# Patient Record
Sex: Male | Born: 1945 | Race: Black or African American | Hispanic: No | Smoking: Never smoker
Health system: Southern US, Community
[De-identification: ages and names within clinical notes are randomized; demographics above are authoritative.]

## PROBLEM LIST (undated history)

## (undated) DIAGNOSIS — R569 Unspecified convulsions: Secondary | ICD-10-CM

## (undated) DIAGNOSIS — I4891 Unspecified atrial fibrillation: Secondary | ICD-10-CM

## (undated) DIAGNOSIS — I428 Other cardiomyopathies: Secondary | ICD-10-CM

## (undated) HISTORY — PX: HERNIA REPAIR: SHX51

## (undated) HISTORY — DX: Other cardiomyopathies: I42.8

---

## 2005-09-28 ENCOUNTER — Emergency Department (HOSPITAL_COMMUNITY): Admission: EM | Admit: 2005-09-28 | Discharge: 2005-09-28 | Payer: Self-pay | Admitting: Emergency Medicine

## 2008-08-03 ENCOUNTER — Emergency Department (HOSPITAL_COMMUNITY): Admission: EM | Admit: 2008-08-03 | Discharge: 2008-08-03 | Payer: Self-pay | Admitting: Emergency Medicine

## 2010-02-17 ENCOUNTER — Emergency Department (HOSPITAL_COMMUNITY): Admission: EM | Admit: 2010-02-17 | Discharge: 2010-02-17 | Payer: Self-pay | Admitting: Emergency Medicine

## 2010-10-30 LAB — POCT I-STAT, CHEM 8
BUN: 16 mg/dL (ref 6–23)
Chloride: 103 mEq/L (ref 96–112)
Glucose, Bld: 108 mg/dL — ABNORMAL HIGH (ref 70–99)
Hemoglobin: 16 g/dL (ref 13.0–17.0)
Potassium: 4.2 mEq/L (ref 3.5–5.1)

## 2010-10-30 LAB — BASIC METABOLIC PANEL
Calcium: 8.6 mg/dL (ref 8.4–10.5)
Creatinine, Ser: 1.68 mg/dL — ABNORMAL HIGH (ref 0.4–1.5)
GFR calc non Af Amer: 41 mL/min — ABNORMAL LOW (ref >60)
Sodium: 135 mEq/L (ref 135–145)

## 2010-10-30 LAB — DIFFERENTIAL
Basophils Relative: 1 % (ref 0–1)
Eosinophils Relative: 0 % (ref 0–5)
Lymphocytes Relative: 26 % (ref 12–46)
Lymphs Abs: 1.3 10*3/uL (ref 0.7–4.0)
Monocytes Relative: 10 % (ref 3–12)
Neutro Abs: 3 10*3/uL (ref 1.7–7.7)

## 2010-10-30 LAB — CBC
HCT: 44.4 % (ref 39.0–52.0)
MCV: 92.9 fL (ref 78.0–100.0)
RBC: 4.78 MIL/uL (ref 4.22–5.81)
RDW: 12.5 % (ref 11.5–15.5)
WBC: 4.8 10*3/uL (ref 4.0–10.5)

## 2010-10-30 LAB — TROPONIN I: Troponin I: 0.03 ng/mL (ref 0.00–0.06)

## 2010-10-30 LAB — CK TOTAL AND CKMB (NOT AT ARMC)
CK, MB: 2.1 ng/mL (ref 0.3–4.0)
Relative Index: 1.6 (ref 0.0–2.5)

## 2010-10-30 LAB — BRAIN NATRIURETIC PEPTIDE: Pro B Natriuretic peptide (BNP): 201 pg/mL — ABNORMAL HIGH (ref 0.0–100.0)

## 2017-02-02 ENCOUNTER — Encounter (HOSPITAL_COMMUNITY): Payer: Self-pay

## 2017-02-02 ENCOUNTER — Emergency Department (HOSPITAL_COMMUNITY): Payer: Medicare Other

## 2017-02-02 ENCOUNTER — Emergency Department (HOSPITAL_COMMUNITY)
Admission: EM | Admit: 2017-02-02 | Discharge: 2017-02-02 | Disposition: A | Discharge status: Home / Self Care | Payer: Medicare Other | Attending: Emergency Medicine | Admitting: Emergency Medicine

## 2017-02-02 DIAGNOSIS — Y939 Activity, unspecified: Secondary | ICD-10-CM | POA: Diagnosis not present

## 2017-02-02 DIAGNOSIS — X58XXXA Exposure to other specified factors, initial encounter: Secondary | ICD-10-CM | POA: Diagnosis not present

## 2017-02-02 DIAGNOSIS — Y929 Unspecified place or not applicable: Secondary | ICD-10-CM | POA: Diagnosis not present

## 2017-02-02 DIAGNOSIS — S32010A Wedge compression fracture of first lumbar vertebra, initial encounter for closed fracture: Secondary | ICD-10-CM | POA: Insufficient documentation

## 2017-02-02 DIAGNOSIS — S3992XA Unspecified injury of lower back, initial encounter: Secondary | ICD-10-CM | POA: Diagnosis present

## 2017-02-02 DIAGNOSIS — Y999 Unspecified external cause status: Secondary | ICD-10-CM | POA: Insufficient documentation

## 2017-02-02 DIAGNOSIS — Z7982 Long term (current) use of aspirin: Secondary | ICD-10-CM | POA: Diagnosis not present

## 2017-02-02 NOTE — ED Notes (Signed)
[employer] notified of order  And will come to fit pt for brace

## 2017-02-02 NOTE — Discharge Instructions (Signed)
Take Tylenol, or Motrin, for pain  Follow-up with your doctor for checkup in 1 week.  Wear the brace as needed for comfort.

## 2017-02-02 NOTE — ED Provider Notes (Signed)
[facility]-EM[facility]Y DEPT Provider Note   CSN: [id number] Arrival date & time: 02/02/17  1914     History   Chief Complaint Chief Complaint  Patient presents with  . Back Pain    HPI Juan Barber is a 71 y.o. male.  The patient presents for evaluation of low back pain.  He states he injured his back 2 days ago, when he fell while having a seizure.  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  He has been using a heating pad on his back, and fell asleep with it so he thinks he burned his back.  He has not been able to get up and eat, because of the discomfort.  He is able to move his arms and legs without problems.  He lives alone.  He came here by EMS for evaluation.  He denies fever, chills, chest pain, cough, weakness or dizziness.  He is unable to walk, because of pain in his back with attempts at ambulation.  There are no other known modifying factors.  HPI  Past Medical History:  Diagnosis Date  . Stroke [facility])     There are no active problems to display for this patient.   Past Surgical History:  Procedure Laterality Date  . HERNIA REPAIR         Home Medications    Prior to Admission medications   Medication Sig Start Date End Date Taking? Authorizing Provider  aspirin EC 81 MG tablet Take 81 mg by mouth daily.   Yes [provider]    Family History No family history on file.  Social History Social History  Substance Use Topics  . Smoking status: Never Smoker  . Smokeless tobacco: Never Used  . Alcohol use Yes     Comment: occassional     Allergies   Other   Review of Systems Review of Systems  All other systems reviewed and are negative.    Physical Exam Updated Vital Signs BP (!) 114/93 (BP Location: Left Arm)   Pulse 70   Temp 98.1 F (36.7 C) (Oral)   Resp 18   Ht 5\' 11"  (1.803 m)   Wt 81.6 kg (180 lb)   SpO2 99%   BMI 25.10 kg/m   Physical Exam  Constitutional: He is  oriented to person, place, and time. He appears well-developed.  Elderly, frail  HENT:  Head: Normocephalic and atraumatic.  Right Ear: External ear normal.  Left Ear: External ear normal.  Eyes: Conjunctivae and EOM are normal. Pupils are equal, round, and reactive to light.  Neck: Normal range of motion and phonation normal. Neck supple.  Cardiovascular: Normal rate, regular rhythm and normal heart sounds.   Pulmonary/Chest: Effort normal and breath sounds normal. He exhibits no bony tenderness.  Abdominal: Soft. There is no tenderness.  Musculoskeletal:  Decreased movement lumbar region secondary to pain.  Negative straight leg raising bilaterally.  No focal tenderness of the musculature of the lower back.  Neurological: He is alert and oriented to person, place, and time. No cranial nerve deficit or sensory deficit. He exhibits normal muscle tone. Coordination normal.  Skin: Skin is warm, dry and intact.  Blisters, some denuded, lumbar region consistent with history of prolonged application of heating pad.  Psychiatric: He has a normal mood and affect. His behavior is normal. Judgment and thought content normal.  Nursing note and vitals reviewed.    ED Treatments / Results  Labs (all  labs ordered are listed, but only abnormal results are displayed) Labs Reviewed - No data to display  EKG  EKG Interpretation None       Radiology Dg Lumbar Spine Complete  Result Date: 02/02/2017 CLINICAL DATA:  Pain following fall 2 days prior EXAM: LUMBAR SPINE - COMPLETE 4+ VIEW COMPARISON:  None. FINDINGS: Frontal, lateral, spot lumbosacral lateral, and bilateral oblique views were obtained. There are 5 non-rib-bearing lumbar type vertebral bodies. There is moderate anterior wedging of the L1 vertebral body. No retropulsion of bone is evident. No other fracture is evident. No spondylolisthesis. The disc spaces appear unremarkable. No appreciable facet arthropathy. IMPRESSION: Moderate  anterior wedging of the L1 vertebral body, acute in appearance. No retropulsion of bone evident. No other fracture. No spondylolisthesis. No appreciable arthropathy. Electronically Signed   By: Bretta Bang III M.D.   On: 02/02/2017 08:57    Procedures Procedures (including critical care time)  Medications Ordered in ED Medications - No data to display   Initial Impression / Assessment and Plan / ED Course  I have reviewed the triage vital signs and the nursing notes.  Pertinent labs & imaging results that were available during my care of the patient were reviewed by me and considered in my medical decision making (see chart for details).      Patient Vitals for the past 24 hrs:  BP Temp Temp src Pulse Resp SpO2 Height Weight  02/02/17 1131 - - - 70 - - - -  02/02/17 1127 (!) 114/93 - - - - 99 % - -  02/02/17 0748 (!) 110/99 98.1 F (36.7 C) Oral 64 18 100 % - -  02/02/17 0746 - - - - - - 5\' 11"  (1.803 m) 81.6 kg (180 lb)    At discharge- reevaluation with update and discussion. After initial assessment and treatment, an updated evaluation reveals he is very comfortable in the lumbar corset, and states that he is ready to go home.  Findings discussed and questions answered. Patrice Moates L    Final Clinical Impressions(s) / ED Diagnoses   Final diagnoses:  Closed compression fracture of first lumbar vertebra, initial encounter (HCC)    Compression fracture lumbar, without evidence for lumbar myelopathy.  He is improved with bracing, and is stable for discharge.  Nursing Notes Reviewed/ Care Coordinated Applicable Imaging Reviewed Interpretation of Laboratory Data incorporated into ED treatment  The patient appears reasonably screened and/or stabilized for discharge and I doubt any other medical condition or other [facility] requiring further screening, evaluation, or treatment in the ED at this time prior to discharge.  Plan: Home Medications-OTC analgesia; Home  Treatments-rest, brace as needed; return here if the recommended treatment, does not improve the symptoms; Recommended follow up-orthopedic or PCP follow-up 1-2 weeks.   New Prescriptions Discharge Medication List as of 02/02/2017 12:13 PM       Mancel Bale, MD 02/02/17 [phone]

## 2017-02-02 NOTE — ED Notes (Signed)
Pt returned from xray

## 2017-02-02 NOTE — ED Notes (Signed)
[employer] in to fit pt with TLSO

## 2017-02-02 NOTE — ED Triage Notes (Signed)
Pt reports that he was working outside Wednesday and when he came inside he thinks he had a seizure and fell onto a moving dolly. States he has been crawling around house and not able to get up. Pt reports pain in in lower back. No incontinence of Bowel or Bladder

## 2017-02-02 NOTE — ED Notes (Signed)
Contacted [employer] will send someone out for a TLSO.

## 2017-02-02 NOTE — ED Notes (Signed)
[employer] placed TLSO on patient. EMS call for transport.

## 2017-05-09 ENCOUNTER — Emergency Department (HOSPITAL_COMMUNITY)
Admission: EM | Admit: 2017-05-09 | Discharge: 2017-05-09 | Disposition: A | Discharge status: Home / Self Care | Payer: Medicare Other | Attending: Emergency Medicine | Admitting: Emergency Medicine

## 2017-05-09 ENCOUNTER — Emergency Department (HOSPITAL_COMMUNITY): Payer: Medicare Other

## 2017-05-09 ENCOUNTER — Encounter (HOSPITAL_COMMUNITY): Payer: Self-pay | Admitting: Emergency Medicine

## 2017-05-09 DIAGNOSIS — S62102A Fracture of unspecified carpal bone, left wrist, initial encounter for closed fracture: Secondary | ICD-10-CM | POA: Insufficient documentation

## 2017-05-09 DIAGNOSIS — S32010G Wedge compression fracture of first lumbar vertebra, subsequent encounter for fracture with delayed healing: Secondary | ICD-10-CM | POA: Diagnosis not present

## 2017-05-09 DIAGNOSIS — W19XXXA Unspecified fall, initial encounter: Secondary | ICD-10-CM | POA: Insufficient documentation

## 2017-05-09 DIAGNOSIS — M545 Low back pain: Secondary | ICD-10-CM | POA: Diagnosis not present

## 2017-05-09 DIAGNOSIS — S6991XA Unspecified injury of right wrist, hand and finger(s), initial encounter: Secondary | ICD-10-CM | POA: Diagnosis present

## 2017-05-09 DIAGNOSIS — S62101A Fracture of unspecified carpal bone, right wrist, initial encounter for closed fracture: Secondary | ICD-10-CM

## 2017-05-09 DIAGNOSIS — Y929 Unspecified place or not applicable: Secondary | ICD-10-CM | POA: Diagnosis not present

## 2017-05-09 DIAGNOSIS — Y999 Unspecified external cause status: Secondary | ICD-10-CM | POA: Diagnosis not present

## 2017-05-09 DIAGNOSIS — Y939 Activity, unspecified: Secondary | ICD-10-CM | POA: Insufficient documentation

## 2017-05-09 DIAGNOSIS — Z7982 Long term (current) use of aspirin: Secondary | ICD-10-CM | POA: Insufficient documentation

## 2017-05-09 HISTORY — DX: Unspecified atrial fibrillation: I48.91

## 2017-05-09 HISTORY — DX: Unspecified convulsions: R56.9

## 2017-05-09 LAB — COMPREHENSIVE METABOLIC PANEL
ALBUMIN: 3.3 g/dL — AB (ref 3.5–5.0)
ALT: 13 U/L — ABNORMAL LOW (ref 17–63)
ANION GAP: 12 (ref 5–15)
AST: 28 U/L (ref 15–41)
Alkaline Phosphatase: 62 U/L (ref 38–126)
BUN: 13 mg/dL (ref 6–20)
CHLORIDE: 95 mmol/L — AB (ref 101–111)
CO2: 24 mmol/L (ref 22–32)
Calcium: 9.3 mg/dL (ref 8.9–10.3)
Creatinine, Ser: 1.44 mg/dL — ABNORMAL HIGH (ref 0.61–1.24)
GFR calc Af Amer: 55 mL/min — ABNORMAL LOW (ref >60)
GFR calc non Af Amer: 47 mL/min — ABNORMAL LOW (ref >60)
GLUCOSE: 130 mg/dL — AB (ref 65–99)
POTASSIUM: 4.1 mmol/L (ref 3.5–5.1)
SODIUM: 131 mmol/L — AB (ref 135–145)
TOTAL PROTEIN: 8 g/dL (ref 6.5–8.1)
Total Bilirubin: 1.5 mg/dL — ABNORMAL HIGH (ref 0.3–1.2)

## 2017-05-09 LAB — CBC WITH DIFFERENTIAL/PLATELET
BASOS ABS: 0.1 10*3/uL (ref 0.0–0.1)
Basophils Relative: 1 %
Eosinophils Absolute: 0 10*3/uL (ref 0.0–0.7)
Eosinophils Relative: 0 %
HEMATOCRIT: 38.8 % — AB (ref 39.0–52.0)
Hemoglobin: 13.6 g/dL (ref 13.0–17.0)
LYMPHS ABS: 2.5 10*3/uL (ref 0.7–4.0)
LYMPHS PCT: 38 %
MCH: 31.7 pg (ref 26.0–34.0)
MCHC: 35.1 g/dL (ref 30.0–36.0)
MCV: 90.4 fL (ref 78.0–100.0)
MONO ABS: 0.8 10*3/uL (ref 0.1–1.0)
MONOS PCT: 12 %
NEUTROS ABS: 3.1 10*3/uL (ref 1.7–7.7)
Neutrophils Relative %: 49 %
Platelets: 299 10*3/uL (ref 150–400)
RBC: 4.29 MIL/uL (ref 4.22–5.81)
RDW: 12.5 % (ref 11.5–15.5)
WBC: 6.5 10*3/uL (ref 4.0–10.5)

## 2017-05-09 NOTE — Care Management (Addendum)
CM contacted in regards to home health. Patient has compression fracture as well as bil wrist fracures. Unsure if he will want home health, wants to talk with family. Will order [facility] and have [facility] contact patient tomorrow for follow up. CM will notify Bonita Quin of [facility], who will obtain orders from chart and f/u with patient tomorrow.

## 2017-05-09 NOTE — Discharge Instructions (Signed)
Call Dr. Mort Sawyers office to arrange a follow-up appt. About your broken wrists.  As we discussed, home health will contact you in 1-2 days to set up home care.  It's important that you also arrange a follow-up appt with your primary doctor at the [facility] regarding your compression fracture.  Return to ER for any worsening symptoms.  Tylenol every 4 hrs for pain

## 2017-05-09 NOTE — ED Provider Notes (Signed)
[facility]-EM[facility]Y DEPT Provider Note   CSN: [id number] Arrival date & time: 05/09/17  0935     History   Chief Complaint Chief Complaint  Patient presents with  . Fall    HPI Juan Barber is a 71 y.o. male.  HPI   Juan Barber is a 71 y.o. male who presents to the Emergency Department complaining of low back pain and bilateral wrist pain.  He was seen here in June and diagnosed with a compression fx of his L1 vertebrae.  He was given a lumbar corset which helped the pain, but he states the brace is broken.  He also states that he suffered a mechanical fall 2 weeks ago in which he put his hands out to try to break his fall, felt a "pop" to the right wrist and feels the same popping sensation with movement of the wrist.  Milder pain with the left wrist as well.  States the pain in his back is causing him difficulty to walk and stand makes his daily activities difficult.  He denies pain radiating into his legs, numbness or weakness of the lower extremities, urine or bowel changes, fever and abdominal pain.  Has not seen a PCP or orthopedist.   Past Medical History:  Diagnosis Date  . Atrial fibrillation (HCC)   . Seizures (HCC)     There are no active problems to display for this patient.   Past Surgical History:  Procedure Laterality Date  . HERNIA REPAIR         Home Medications    Prior to Admission medications   Medication Sig Start Date End Date Taking? Authorizing Provider  aspirin EC 81 MG tablet Take 81 mg by mouth daily.    [provider]    Family History History reviewed. No pertinent family history.  Social History Social History  Substance Use Topics  . Smoking status: Never Smoker  . Smokeless tobacco: Never Used  . Alcohol use Yes     Comment: occassional     Allergies   Other   Review of Systems Review of Systems  Constitutional: Negative for fever.  Respiratory: Negative for shortness of breath.   Gastrointestinal: Negative  for abdominal pain and vomiting.  Genitourinary: Negative for decreased urine volume, difficulty urinating, dysuria, flank pain and hematuria.  Musculoskeletal: Positive for arthralgias (bilateral wrist pain) and back pain. Negative for joint swelling, neck pain and neck stiffness.  Skin: Negative for rash.  Neurological: Negative for dizziness, syncope, weakness and numbness.  All other systems reviewed and are negative.    Physical Exam Updated Vital Signs BP 137/71 (BP Location: Left Arm)   Pulse (!) 55 Comment: irregular  Temp 98 F (36.7 C) (Oral)   Resp 19   Ht 6' (1.829 m)   Wt 80.3 kg (177 lb)   SpO2 100%   BMI 24.01 kg/m   Physical Exam  Constitutional: He is oriented to person, place, and time. He appears well-developed and well-nourished. No distress.  HENT:  Head: Normocephalic and atraumatic.  Eyes: Pupils are equal, round, and reactive to light. EOM are normal.  Neck: Normal range of motion. Neck supple.  Cardiovascular: Normal rate, regular rhythm, normal heart sounds and intact distal pulses.   No murmur heard. Pulmonary/Chest: Effort normal and breath sounds normal. No respiratory distress.  Abdominal: Soft. He exhibits no distension. There is no tenderness.  Musculoskeletal: He exhibits tenderness. He exhibits no edema.       Lumbar back: He exhibits  tenderness and pain. He exhibits normal range of motion, no swelling, no deformity, no laceration and normal pulse.  Midline ttp of the upper lumbar spine   Pt has 5/5 strength against resistance of bilateral lower extremities.  Moderate edema of the right wrist.  Mild ttp of the distal left wrist.       Neurological: He is alert and oriented to person, place, and time. He has normal strength. No sensory deficit. He exhibits normal muscle tone. Coordination and gait normal. GCS eye subscore is 4. GCS verbal subscore is 5. GCS motor subscore is 6.  Reflex Scores:      Patellar reflexes are 2+ on the right side and  2+ on the left side.      Achilles reflexes are 2+ on the right side and 2+ on the left side. CN III-XII intact  Skin: Skin is warm and dry. Capillary refill takes less than 2 seconds. No rash noted.  Psychiatric: He has a normal mood and affect.  Nursing note and vitals reviewed.    ED Treatments / Results  Labs (all labs ordered are listed, but only abnormal results are displayed) Labs Reviewed  COMPREHENSIVE METABOLIC PANEL - Abnormal; Notable for the following:       Result Value   Sodium 131 (*)    Chloride 95 (*)    Glucose, Bld 130 (*)    Creatinine, Ser 1.44 (*)    Albumin 3.3 (*)    ALT 13 (*)    Total Bilirubin 1.5 (*)    GFR calc non Af Amer 47 (*)    GFR calc Af Amer 55 (*)    All other components within normal limits  CBC WITH DIFFERENTIAL/PLATELET - Abnormal; Notable for the following:    HCT 38.8 (*)    All other components within normal limits    EKG  EKG Interpretation None       Radiology Dg Lumbar Spine Complete  Result Date: 05/09/2017 CLINICAL DATA:  71 year old male with back pain for approximately 3 weeks. Status post fall 2 weeks ago. EXAM: LUMBAR SPINE - COMPLETE 4+ VIEW COMPARISON:  02/02/2017 FINDINGS: There is interval worsening of a wedge compression fracture at L1, now demonstrating greater than 50% loss of height. There is interval resorption of the anterior vertebral body. The remaining vertebral bodies and disc spaces appear unremarkable. There is mild facet hypertrophy at the lower lumbar spine. Fistula lies visceral structures grossly unremarkable.  L IMPRESSION: Interval progression of a wedge compression fracture L1 compared to study dated 02/02/2017, now demonstrating greater than 50% loss of height. There is interval reabsorption of the anterior vertebral body, and underlying pathologic lesion is difficult to exclude. Further evaluation with MRI is recommended. Electronically Signed   By: Sande Brothers M.D.   On: 05/09/2017 11:18   Dg  Wrist Complete Left  Result Date: 05/09/2017 CLINICAL DATA:  16 minutes year old male with pain status post fall 2 weeks ago. EXAM: LEFT WRIST - COMPLETE 3+ VIEW COMPARISON:  None. FINDINGS: There is oblique fracture involving the fifth metacarpal shaft. Fracture fragments do not appear to involve the articular surface. Carpal bones are intact. No significant soft tissue swelling about the wrist. IMPRESSION: Oblique fracture involving the fifth metacarpal midshaft. Dedicated views of the left hand recommended. Electronically Signed   By: Sande Brothers M.D.   On: 05/09/2017 11:08   Dg Wrist Complete Right  Result Date: 05/09/2017 CLINICAL DATA:  71 year old male status post fall two weeks ago.  EXAM: RIGHT WRIST - COMPLETE 3+ VIEW COMPARISON:  None. FINDINGS: Transverse fractures identified at the distal radial metadiaphysis. There is mild volar angulation and displacement of the distal fracture fragment. The fracture does not appear to involve the articular surface. Note is made of diffuse osteopenia. Remaining osseous structures intact. There is soft tissue swelling about the wrist. IMPRESSION: Transverse fracture involving the distal radial metadiaphysis with mild volar angulation and displacement of the distal fracture fragment. Electronically Signed   By: Sande Brothers M.D.   On: 05/09/2017 11:07   Mr Lumbar Spine Wo Contrast  Result Date: 05/09/2017 CLINICAL DATA:  Chronic back pain. Question worsening of L1 compression fracture. EXAM: MRI LUMBAR SPINE WITHOUT CONTRAST TECHNIQUE: Multiplanar, multisequence MR imaging of the lumbar spine was performed. No intravenous contrast was administered. COMPARISON:  Radiography from earlier today FINDINGS: Segmentation:  Standard Alignment:  Negative for listhesis Vertebrae: There is a L1 compression fracture that was first seen February 02, 2017. The fracture does show progressive collapse compared to that time, although posterior cortex is intact and there is no  retropulsion. Centrally, there is severe height loss and even a defect in the central body, with touching discs. Marrow edema is minimal. The L1 body is diffusely sclerotic. On sagittal T1 weighted imaging there appears to be ventral soft tissue extra osseous density, but this is not re- demonstrated on axial images. No epidural, foraminal, or pedicular signal abnormality. Elsewhere the marrow is heterogeneous without acute fracture, discitis, or aggressive bone lesion. Conus medullaris: Extends to the L1 level and appears normal. Paraspinal and other soft tissues: Thickened trabeculated bladder with a posterior diverticulum, suggesting chronic outlet obstruction. Disc levels: T12- L1: Mild far-lateral disc bulging.  No impingement L1-L2: Mild disc bulging and narrowing.  No compressive stenosis L2-L3: Mild rightward disc bulging.  No impingement L3-L4: Mild annulus bulging.  No impingement L4-L5: Degenerative disc narrowing and bulging with a central protrusion. Bilateral inferior foraminal effacement, moderate stenosis. Mild thecal sac narrowing. L5-S1:Unremarkable. These results were called by telephone at the time of interpretation on 05/09/2017 at 12:27 pm to Dr. Pauline Aus , who verbally acknowledged these results. IMPRESSION: 1. L1 compression fracture with advanced, progressive central and anterior height loss when compared to 02/02/2017 radiography. There is no significant marrow edema to suggest unhealed fracture component. Diffuse body sclerosis and demineralization is concerning for an underlying lesion, but posttraumatic re- absorption and sclerosis is also possible. If malignancy history or concerning clinical features for occult malignancy, a biopsy could be obtained. Otherwise, short interval follow-up MRI with contrast is recommended (such as 6 to 8 weeks). 2. L4-5 disc bulging and moderate bilateral foraminal narrowing. Elsewhere degenerative changes are mild. Electronically Signed   By:  Marnee Spring M.D.   On: 05/09/2017 12:35    Procedures Procedures (including critical care time)  Medications Ordered in ED Medications - No data to display   Initial Impression / Assessment and Plan / ED Course  I have reviewed the triage vital signs and the nursing notes.  Pertinent labs & imaging results that were available during my care of the patient were reviewed by me and considered in my medical decision making (see chart for details).    TLSO brace applied.  Pain improved.  Discussed findings. Offered admission given that pt lives at home alone and may have difficulty with ADL's.  Pt declined admission.   Consulted Sharley with case management.  She will contact pt at home to arrange home health f/u.  Pt  agrees with this plan.  1400 consulted Dr. Romeo Apple, recommended volar splints and will see pt in his office.    Pt is feeling better, return precautions discussed.   Final Clinical Impressions(s) / ED Diagnoses   Final diagnoses:  Closed compression fracture of L1 lumbar vertebra with delayed healing, subsequent encounter  Closed fracture of both wrists, initial encounter    New Prescriptions New Prescriptions   No medications on file     Pauline Aus, Cordelia Poche 05/11/17 2109    Samuel Jester, DO 05/12/17 2318

## 2017-05-09 NOTE — ED Triage Notes (Signed)
Pt c/o chronic back pain. States started huring while walking 3 weeks ago. Pt states fell x 2 weeks ago from seizure and c/o breaking right and left wrist. Has not been seen or treated. Right wrist swelling noted. Radial pulses wnl. rom wnl. nad

## 2017-05-11 ENCOUNTER — Ambulatory Visit (INDEPENDENT_AMBULATORY_CARE_PROVIDER_SITE_OTHER): Payer: Medicare Other | Admitting: Orthopedic Surgery

## 2017-05-11 ENCOUNTER — Ambulatory Visit (INDEPENDENT_AMBULATORY_CARE_PROVIDER_SITE_OTHER): Payer: Medicare Other

## 2017-05-11 ENCOUNTER — Encounter: Payer: Self-pay | Admitting: Orthopedic Surgery

## 2017-05-11 VITALS — BP 122/80 | HR 48 | Ht 72.0 in | Wt 177.0 lb

## 2017-05-11 DIAGNOSIS — S62327A Displaced fracture of shaft of fifth metacarpal bone, left hand, initial encounter for closed fracture: Secondary | ICD-10-CM

## 2017-05-11 DIAGNOSIS — S62101A Fracture of unspecified carpal bone, right wrist, initial encounter for closed fracture: Secondary | ICD-10-CM

## 2017-05-11 DIAGNOSIS — S6292XA Unspecified fracture of left wrist and hand, initial encounter for closed fracture: Secondary | ICD-10-CM

## 2017-05-11 DIAGNOSIS — S52541A Smith's fracture of right radius, initial encounter for closed fracture: Secondary | ICD-10-CM | POA: Diagnosis not present

## 2017-05-11 NOTE — Progress Notes (Signed)
NEW PATIENT OFFICE VISIT    Chief Complaint  Patient presents with  . New Patient (Initial Visit)    ER Follow up bilateral wrist fracture    71 year old male presents for evaluation of bilateral upper extremity fractures. The patient has had 2 falls while on June 22 where he fractured his back he sustained a compression fracture which was treated with a brace  He fell again approximately 3 weeks ago. Making date of injury September 7 injured his right wrist and left hand. 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  Pain is mild it's dull it's constant and it's in both upper extremities duration as described also complains of back pain    Review of Systems  Constitutional: Negative for fever.  Respiratory: Negative for cough.   Cardiovascular: Negative for chest pain.  Musculoskeletal: Positive for back pain.  Neurological: Negative for tingling.     Past Medical History:  Diagnosis Date  . Atrial fibrillation (HCC)   . Seizures (HCC)     Past Surgical History:  Procedure Laterality Date  . HERNIA REPAIR      History reviewed. No pertinent family history. Social History  Substance Use Topics  . Smoking status: Never Smoker  . Smokeless tobacco: Never Used  . Alcohol use Yes     Comment: occassional    BP 122/80   Pulse (!) 48   Ht 6' (1.829 m)   Wt 177 lb (80.3 kg)   BMI 24.01 kg/m   Physical Exam  Constitutional: He is oriented to person, place, and time. He appears well-developed and well-nourished.  Vital signs have been reviewed and are stable. Gen. appearance the patient is well-developed and  well-nourished with normal grooming and hygiene.   Musculoskeletal:  GAIT requires wheelchair at times but he walked in the office  Neurological: He is alert and oriented to person, place, and time.  Skin: Skin is warm and dry. No erythema.  Psychiatric: He has a normal mood and affect.  Vitals reviewed.   Ortho Exam  Left hand Dupuytren's contracture left small finger fixed contracture no motion at the fracture site mild pain over the fifth metacarpal from what we can assess the range of motion is limited by the Dupuytren's contracture there is no instability of the MCP joint there is no atrophy in the hand skin is intact pulse and perfusion are normal  The right wrist shows swelling no volar displacement deformity despite what we see on x-ray. He has pain range of motion of the wrist joint with a stable wrist joint but I think this is because of fracture started to consolidate there is no atrophy noted. The skin is dark but intact pulse and perfusion are normal  No orders of the defined types were placed in this encounter.   Encounter Diagnoses  Name Primary?  . Closed fracture of left hand, initial encounter Yes  . Right wrist fracture, closed, initial encounter    X-ray independent review Right wrist x-rays show again volar subluxation of the carpus with the volar fragment of the distal radius  Smith type II volar subluxation fracture dislocation  Left hand fifth metacarpal fracture  Films were repeated  The left fifth metacarpal is slightly translated medial to lateral proximal fragment to distal fragment. He has a right volar subluxation Katrinka Blazing type II fracture subluxation.  I placed him in a right hand cast with her metacarpal fracture follow-up in 3 weeks repeat x-ray out of plaster. We did have to bivalved cast for tightness  He will see hand specialist regarding his left wrist fracture subluxation.    PLAN:

## 2017-05-15 ENCOUNTER — Encounter (INDEPENDENT_AMBULATORY_CARE_PROVIDER_SITE_OTHER): Payer: Self-pay | Admitting: Orthopaedic Surgery

## 2017-05-15 ENCOUNTER — Ambulatory Visit (INDEPENDENT_AMBULATORY_CARE_PROVIDER_SITE_OTHER): Payer: Medicare Other | Admitting: Orthopaedic Surgery

## 2017-05-15 DIAGNOSIS — S52561A Barton's fracture of right radius, initial encounter for closed fracture: Secondary | ICD-10-CM

## 2017-05-15 DIAGNOSIS — S52541A Smith's fracture of right radius, initial encounter for closed fracture: Secondary | ICD-10-CM | POA: Insufficient documentation

## 2017-05-15 NOTE — Progress Notes (Signed)
Office Visit Note   Patient: Juan Barber           Date of Birth: [birth year]           MRN: [id number] Visit Date: 05/15/2017              Requested by: Center, [facility] [address] [place], [place] [address] PCP: Center, [facility]   Assessment & Plan: Visit Diagnoses:  1. Closed Barton's fracture of right radius, initial encounter     Plan: Patient has a subacute volar Barton distal radius fracture. I recommend surgical treatment to correct this deformity and the persistent dislocation of his wrist. He understands that he may develop posttraumatic arthritis from this injury. I will also plan on doing a carpal tunnel release to help take pressure off of the median nerve. We may need to supplement his repair with an external fixator or a bridge plate if the volar plate is not enough. The details of the surgery were discussed with the patient in agreement. We will plan on surgery this week. Total face to face encounter time was greater than 45 minutes and over half of this time was spent in counseling and/or coordination of care.  Follow-Up Instructions: Return for 2 week postop visit.   Orders:  No orders of the defined types were placed in this encounter.  No orders of the defined types were placed in this encounter.     Procedures: No procedures performed   Clinical Data: No additional findings.   Subjective: Chief Complaint  Patient presents with  . Right Wrist - Injury    Patient is a 71 year old gentleman who fell on his outstretched hand and sustained a fracture approximately 3 weeks ago. He did not seek immediate medical attention until 05/09/2017. He was diagnosed with a unstable volar Barton fracture of the right distal radius. He denies any signs or symptoms of carpal tunnel syndrome. He lives alone. He does have a history of seizures and his most recent wound was at the time of his fall. He takes aspirin for his atrial fibrillation. Denies any  numbness and tingling.    Review of Systems  Constitutional: Negative.   All other systems reviewed and are negative.    Objective: Vital Signs: There were no vitals taken for this visit.  Physical Exam  Constitutional: He is oriented to person, place, and time. He appears well-developed and well-nourished.  HENT:  Head: Normocephalic and atraumatic.  Eyes: Pupils are equal, round, and reactive to light.  Neck: Neck supple.  Pulmonary/Chest: Effort normal.  Abdominal: Soft.  Musculoskeletal: Normal range of motion.  Neurological: He is alert and oriented to person, place, and time.  Skin: Skin is warm.  Psychiatric: He has a normal mood and affect. His behavior is normal. Judgment and thought content normal.  Nursing note and vitals reviewed.   Ortho Exam Right wrist and hand exam shows no numbness and tingling of his hand or fingers. He has pain with wrist flexion and extension. His hand is volarly translated. Bounding pulses. Specialty Comments:  No specialty comments available.  Imaging: No results found.   PMFS History: Patient Active Problem List   Diagnosis Date Noted  . Closed Smith's fracture of right radius 05/15/2017   Past Medical History:  Diagnosis Date  . Atrial fibrillation (HCC)   . Seizures (HCC)     No family history on file.  Past Surgical History:  Procedure Laterality Date  . HERNIA REPAIR  Social History   Occupational History  . Not on file.   Social History Main Topics  . Smoking status: Never Smoker  . Smokeless tobacco: Never Used  . Alcohol use Yes     Comment: occassional  . Drug use: No  . Sexual activity: Not on file

## 2017-05-18 ENCOUNTER — Other Ambulatory Visit: Payer: Self-pay

## 2017-05-18 ENCOUNTER — Ambulatory Visit: Admit: 2017-05-18 | Payer: Medicare Other | Admitting: Orthopaedic Surgery

## 2017-05-18 ENCOUNTER — Encounter: Payer: Self-pay | Admitting: Family Medicine

## 2017-05-18 ENCOUNTER — Ambulatory Visit (HOSPITAL_COMMUNITY)
Admission: RE | Admit: 2017-05-18 | Discharge: 2017-05-18 | Disposition: A | Discharge status: Home / Self Care | Payer: Medicare Other | Source: Ambulatory Visit | Attending: Family Medicine | Admitting: Family Medicine

## 2017-05-18 ENCOUNTER — Ambulatory Visit (INDEPENDENT_AMBULATORY_CARE_PROVIDER_SITE_OTHER): Payer: Medicare Other | Admitting: Family Medicine

## 2017-05-18 VITALS — BP 120/70 | HR 69 | Temp 97.8°F | Resp 16 | Ht 71.0 in | Wt 157.0 lb

## 2017-05-18 DIAGNOSIS — Z01818 Encounter for other preprocedural examination: Secondary | ICD-10-CM | POA: Diagnosis present

## 2017-05-18 DIAGNOSIS — I4891 Unspecified atrial fibrillation: Secondary | ICD-10-CM | POA: Diagnosis not present

## 2017-05-18 DIAGNOSIS — R569 Unspecified convulsions: Secondary | ICD-10-CM

## 2017-05-18 DIAGNOSIS — R739 Hyperglycemia, unspecified: Secondary | ICD-10-CM | POA: Diagnosis not present

## 2017-05-18 DIAGNOSIS — I499 Cardiac arrhythmia, unspecified: Secondary | ICD-10-CM

## 2017-05-18 SURGERY — OPEN REDUCTION INTERNAL FIXATION (ORIF) DISTAL RADIUS FRACTURE
Anesthesia: General | Site: Wrist | Laterality: Right
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
## 2017-05-18 NOTE — Progress Notes (Signed)
Cardiology Office Note   Date:  05/21/2017   ID:  Juan Barber, DOB [birth year], MRN [id number]  PCP:  Aliene Beams, MD  Cardiologist:   Charlton Haws, MD   No chief complaint on file.     History of Present Illness: Juan Barber is a 71 y.o. male who presents for consultation regarding irregular heart beat. Referred by Dr Tracie Harrier  History of seizures. Two recent falls ? Related to seizures with injury to back and bilateral wrists. Also feels weak  And lightheaded when its hot out Was to have surgery with Dr Roda Shutters for fracture of distal right radius Note made of history of atrial fibrillation ECG done 05/18/17 showed ST with multiple PAC;s and PVC;s   He is right handed and has arm in splint Apparently it is not healing correctly and needs surgical correction. Left wrist is in cast and healing Well. He is a non smoker No dyspnea Some palpitations. Two episodes of "syncope" that injured back and wrists occurred during heat when He says he got too hot No chest pain or palpitations at time.   He is vague about his history of PAF but has not been on anticoagulation   This patients CHA2DS2-VASc Score and unadjusted Ischemic Stroke Rate (% per year) is equal to 0.6 % stroke rate/year from a score of 1  Above score calculated as 1 point each if present [CHF, HTN, DM, Vascular=MI/PAD/Aortic Plaque, Age if 65-74, or Male] Above score calculated as 2 points each if present [Age > 75, or Stroke/TIA/TE]    Past Medical History:  Diagnosis Date  . Atrial fibrillation (HCC)   . Seizures (HCC)     Past Surgical History:  Procedure Laterality Date  . HERNIA REPAIR       Current Outpatient Prescriptions  Medication Sig Dispense Refill  . aspirin EC 81 MG tablet Take 81 mg by mouth daily.    . metoprolol tartrate (LOPRESSOR) 50 MG tablet Take 1 tablet (50 mg total) by mouth 2 (two) times daily. 180 tablet 3   No current facility-administered medications for this visit.      Allergies:   Other and Talwin [pentazocine]    Social History:  The patient  reports that he has never smoked. He has never used smokeless tobacco. He reports that he drinks alcohol. He reports that he does not use drugs.   Family History:  The patient's family history includes Heart attack in his mother.    ROS:  Please see the history of present illness.   Otherwise, review of systems are positive for none.   All other systems are reviewed and negative.    PHYSICAL EXAM: VS:  BP 120/66 (BP Location: Left Arm)   Pulse 67   Ht 5\' 10"  (1.778 m)   Wt 156 lb (70.8 kg)   SpO2 96%   BMI 22.38 kg/m  , BMI Body mass index is 22.38 kg/m. Affect appropriate Elderly black male  HEENT: normal Neck supple with no adenopathy JVP normal no bruits no thyromegaly Lungs clear with no wheezing and good diaphragmatic motion Heart:  S1/S2 SEM  murmur, no rub, gallop or click PMI normal Abdomen: benighn, BS positve, no tenderness, no AAA no bruit.  No HSM or HJR Distal pulses intact with no bruits No edema Neuro non-focal Skin warm and dry Right hand in splint and left in cast     EKG:  ST multiple PACls and PVC;s    Recent Labs: 05/09/2017: ALT  13; Hemoglobin 13.6; Platelets 299 05/18/2017: BUN 12; Creat 1.36; Potassium 4.0; Sodium 132; TSH 1.87    Lipid Panel No results found for: CHOL, TRIG, HDL, CHOLHDL, VLDL, LDLCALC, LDLDIRECT    Wt Readings from Last 3 Encounters:  05/21/17 156 lb (70.8 kg)  05/18/17 157 lb (71.2 kg)  05/11/17 177 lb (80.3 kg)      Other studies Reviewed: Additional studies/ records that were reviewed today include: Notes ER labs ECG and xrays .    ASSESSMENT AND PLAN:  1.  Pre-op need to establish baseline rhythm with monitor and r/o structural disease with echo.No need for stress testing at this time 2. Tachycardia appears to have both PAF and ST with frequent PAC;s/ PVCls will start lopressor 50 bid and do 7 day monitor If  Mostly afib will  rate control and if echo ok clear for surgery . CHA2VASC is only one but would start anticoagulation if he needs [facility] Post op  3. Seizures needs f/u with neuro EEG and possible CT head don't think this is related to his cardiac rhythm    Current medicines are reviewed at length with the patient today.  The patient does not have concerns regarding medicines.  The following changes have been made:  Lopresser 50 bid   Labs/ tests ordered today include: event monitor echo   Orders Placed This Encounter  Procedures  . Cardiac event monitor  . EKG 12-Lead  . ECHOCARDIOGRAM COMPLETE     Disposition:   FU with me or Ivy after monitor and echo      Signed, Charlton Haws, MD  05/21/2017 2:41 PM    [facility] [address] [place], [place], [place]  [address] Phone: [phone]; Fax: [phone]

## 2017-05-19 LAB — BASIC METABOLIC PANEL
BUN/Creatinine Ratio: 9 (calc) (ref 6–22)
BUN: 12 mg/dL (ref 7–25)
CALCIUM: 9 mg/dL (ref 8.6–10.3)
CO2: 22 mmol/L (ref 20–32)
CREATININE: 1.36 mg/dL — AB (ref 0.70–1.18)
Chloride: 99 mmol/L (ref 98–110)
GLUCOSE: 130 mg/dL — AB (ref 65–99)
POTASSIUM: 4 mmol/L (ref 3.5–5.3)
Sodium: 132 mmol/L — ABNORMAL LOW (ref 135–146)

## 2017-05-19 LAB — HEMOGLOBIN A1C
Hgb A1c MFr Bld: 5.8 % of total Hgb — ABNORMAL HIGH (ref <5.7)
MEAN PLASMA GLUCOSE: 120 (calc)
eAG (mmol/L): 6.6 (calc)

## 2017-05-19 LAB — TSH: TSH: 1.87 m[IU]/L (ref 0.40–4.50)

## 2017-05-21 ENCOUNTER — Encounter: Payer: Self-pay | Admitting: Cardiovascular Disease

## 2017-05-21 ENCOUNTER — Ambulatory Visit (INDEPENDENT_AMBULATORY_CARE_PROVIDER_SITE_OTHER): Payer: Medicare Other | Admitting: Cardiovascular Disease

## 2017-05-21 VITALS — BP 120/66 | HR 67 | Ht 70.0 in | Wt 156.0 lb

## 2017-05-21 DIAGNOSIS — R55 Syncope and collapse: Secondary | ICD-10-CM

## 2017-05-21 DIAGNOSIS — I480 Paroxysmal atrial fibrillation: Secondary | ICD-10-CM | POA: Diagnosis not present

## 2017-05-21 MED ORDER — METOPROLOL TARTRATE 50 MG PO TABS: 50.0000 mg | ORAL_TABLET | Freq: Two times a day (BID) | ORAL | 3 refills | Status: DC

## 2017-05-21 NOTE — Patient Instructions (Addendum)
Medication Instructions:  START METOPROLOL ( LOPRESSOR) 50 MG - Take 1 tablet  TWO TIMES DAILY    Labwork: none  Testing/Procedures: Your physician has recommended that you wear an event monitor. Event monitors are medical devices that record the heart's electrical activity. Doctors most often [place] these monitors to diagnose arrhythmias. Arrhythmias are problems with the speed or rhythm of the heartbeat. The monitor is a small, portable device. You can wear one while you do your normal daily activities. This is usually used to diagnose what is causing palpitations/syncope (passing out). 7 day  Your physician has requested that you have an echocardiogram. Echocardiography is a painless test that uses sound waves to create images of your heart. It provides your doctor with information about the size and shape of your heart and how well your heart's chambers and valves are working. This procedure takes approximately one hour. There are no restrictions for this procedure.     Follow-Up: Your physician recommends that you schedule a follow-up appointment in: 6-8 weeks    Any Other Special Instructions Will Be Listed Below (If Applicable).     If you need a refill on your cardiac medications before your next appointment, please call your pharmacy.

## 2017-05-22 ENCOUNTER — Ambulatory Visit (HOSPITAL_COMMUNITY)
Admission: RE | Admit: 2017-05-22 | Discharge: 2017-05-22 | Disposition: A | Discharge status: Home / Self Care | Payer: Medicare Other | Source: Ambulatory Visit | Attending: Cardiovascular Disease | Admitting: Cardiovascular Disease

## 2017-05-22 DIAGNOSIS — I340 Nonrheumatic mitral (valve) insufficiency: Secondary | ICD-10-CM | POA: Diagnosis not present

## 2017-05-22 DIAGNOSIS — I517 Cardiomegaly: Secondary | ICD-10-CM | POA: Diagnosis not present

## 2017-05-22 DIAGNOSIS — I493 Ventricular premature depolarization: Secondary | ICD-10-CM | POA: Diagnosis not present

## 2017-05-22 DIAGNOSIS — I4891 Unspecified atrial fibrillation: Secondary | ICD-10-CM | POA: Diagnosis not present

## 2017-05-22 DIAGNOSIS — R55 Syncope and collapse: Secondary | ICD-10-CM | POA: Diagnosis present

## 2017-05-22 LAB — ECHOCARDIOGRAM COMPLETE
AVLVOTPG: 3 mmHg
CHL CUP DOP CALC LVOT VTI: 18.9 cm
CHL CUP MV DEC (S): 120
EWDT: 120 ms
FS: 12 % — AB (ref 28–44)
IV/PV OW: 1.08
LA vol index: 16 mL/m2
LADIAMINDEX: 1.81 cm/m2
LASIZE: 34 mm
LAVOL: 30.1 mL
LAVOLA4C: 30 mL
LEFT ATRIUM END SYS DIAM: 34 mm
LV dias vol index: 44 mL/m2
LV dias vol: 83 mL (ref 62–150)
LV sys vol: 53 mL
LVOT area: 3.14 cm2
LVOT diameter: 20 mm
LVOT peak vel: 91.7 cm/s
LVOTSV: 59 mL
LVSYSVOLIN: 28 mL/m2
MV Peak grad: 7 mmHg
MV pk E vel: 131 m/s
PW: 10 mm — AB (ref 0.6–1.1)
RV LATERAL S' VELOCITY: 11.9 cm/s
RV TAPSE: 13.8 mm
Simpson's disk: 36
Stroke v: 29 ml

## 2017-05-22 NOTE — Progress Notes (Signed)
*  PRELIMINARY RESULTS* Echocardiogram 2D Echocardiogram has been performed.  Stacey Drain 05/22/2017, 2:49 PM

## 2017-05-23 ENCOUNTER — Telehealth: Payer: Self-pay

## 2017-05-23 ENCOUNTER — Ambulatory Visit (INDEPENDENT_AMBULATORY_CARE_PROVIDER_SITE_OTHER): Payer: Medicare Other

## 2017-05-23 DIAGNOSIS — I480 Paroxysmal atrial fibrillation: Secondary | ICD-10-CM | POA: Diagnosis not present

## 2017-05-23 NOTE — Telephone Encounter (Signed)
Called to inform patient of echo results. He voiced understanding. I tried to reach the guy that helps him with transportation, but he was unavailable. I left him a message for him to return call so I can discuss the days he is able to drive pt. To [facility] for Heart Cath. Pt is taking his metoprolol.

## 2017-05-23 NOTE — Telephone Encounter (Signed)
-----   Message from Peter C Nishan, MD sent at 05/22/2017  4:41 PM EDT ----- EF 30-35% still need 7 day monitor will need heart cath to clear for surgery can set up for latter this week or early next week just needs labs Make sure he is taking metoprolol 

## 2017-05-25 ENCOUNTER — Telehealth: Payer: Self-pay | Admitting: *Deleted

## 2017-05-25 NOTE — Telephone Encounter (Signed)
Called patient with test results. No answer. Left message to call back.  

## 2017-05-25 NOTE — Telephone Encounter (Signed)
-----   Message from Peter C Nishan, MD sent at 05/22/2017  4:41 PM EDT ----- EF 30-35% still need 7 day monitor will need heart cath to clear for surgery can set up for latter this week or early next week just needs labs Make sure he is taking metoprolol 

## 2017-05-29 ENCOUNTER — Telehealth: Payer: Self-pay

## 2017-05-29 NOTE — Telephone Encounter (Signed)
-----   Message from Wendall Stade, MD sent at 05/22/2017  4:41 PM EDT ----- EF 30-35% still need 7 day monitor will need heart cath to clear for surgery can set up for latter this week or early next week just needs labs Make sure he is taking metoprolol

## 2017-05-29 NOTE — Telephone Encounter (Signed)
Called Silvestre Gunner to let him know that pt has his cath scheduled for Oct 23 @ 12., no answer. Left him a message to return call as he is on vacation this week.

## 2017-05-30 ENCOUNTER — Other Ambulatory Visit: Payer: Self-pay

## 2017-05-30 DIAGNOSIS — Z01818 Encounter for other preprocedural examination: Secondary | ICD-10-CM

## 2017-05-31 DIAGNOSIS — S62327A Displaced fracture of shaft of fifth metacarpal bone, left hand, initial encounter for closed fracture: Secondary | ICD-10-CM | POA: Insufficient documentation

## 2017-06-01 ENCOUNTER — Ambulatory Visit (INDEPENDENT_AMBULATORY_CARE_PROVIDER_SITE_OTHER): Payer: Medicare Other | Admitting: Orthopaedic Surgery

## 2017-06-01 ENCOUNTER — Encounter (INDEPENDENT_AMBULATORY_CARE_PROVIDER_SITE_OTHER): Payer: Self-pay

## 2017-06-04 ENCOUNTER — Telehealth: Payer: Self-pay

## 2017-06-04 ENCOUNTER — Other Ambulatory Visit (HOSPITAL_COMMUNITY)
Admission: RE | Admit: 2017-06-04 | Discharge: 2017-06-04 | Disposition: A | Discharge status: Home / Self Care | Payer: Medicare Other | Source: Ambulatory Visit | Attending: Cardiovascular Disease | Admitting: Cardiovascular Disease

## 2017-06-04 ENCOUNTER — Encounter: Payer: Self-pay | Admitting: Orthopedic Surgery

## 2017-06-04 ENCOUNTER — Ambulatory Visit (INDEPENDENT_AMBULATORY_CARE_PROVIDER_SITE_OTHER): Payer: Medicare Other

## 2017-06-04 ENCOUNTER — Ambulatory Visit (INDEPENDENT_AMBULATORY_CARE_PROVIDER_SITE_OTHER): Payer: Self-pay | Admitting: Orthopedic Surgery

## 2017-06-04 VITALS — BP 128/80 | HR 65 | Ht 71.0 in | Wt 162.0 lb

## 2017-06-04 DIAGNOSIS — S62327D Displaced fracture of shaft of fifth metacarpal bone, left hand, subsequent encounter for fracture with routine healing: Secondary | ICD-10-CM

## 2017-06-04 DIAGNOSIS — Z01818 Encounter for other preprocedural examination: Secondary | ICD-10-CM | POA: Diagnosis present

## 2017-06-04 LAB — COMPREHENSIVE METABOLIC PANEL
ALK PHOS: 74 U/L (ref 38–126)
ALT: 13 U/L — ABNORMAL LOW (ref 17–63)
ANION GAP: 7 (ref 5–15)
AST: 26 U/L (ref 15–41)
Albumin: 3.2 g/dL — ABNORMAL LOW (ref 3.5–5.0)
BUN: 14 mg/dL (ref 6–20)
CALCIUM: 8.7 mg/dL — AB (ref 8.9–10.3)
CHLORIDE: 97 mmol/L — AB (ref 101–111)
CO2: 25 mmol/L (ref 22–32)
Creatinine, Ser: 1.23 mg/dL (ref 0.61–1.24)
GFR calc non Af Amer: 57 mL/min — ABNORMAL LOW (ref >60)
Glucose, Bld: 127 mg/dL — ABNORMAL HIGH (ref 65–99)
Potassium: 4.5 mmol/L (ref 3.5–5.1)
SODIUM: 129 mmol/L — AB (ref 135–145)
Total Bilirubin: 1.1 mg/dL (ref 0.3–1.2)
Total Protein: 7.5 g/dL (ref 6.5–8.1)

## 2017-06-04 LAB — CBC WITH DIFFERENTIAL/PLATELET
Basophils Absolute: 0.1 10*3/uL (ref 0.0–0.1)
Basophils Relative: 1 %
EOS ABS: 0.1 10*3/uL (ref 0.0–0.7)
EOS PCT: 1 %
HCT: 35.7 % — ABNORMAL LOW (ref 39.0–52.0)
Hemoglobin: 12.6 g/dL — ABNORMAL LOW (ref 13.0–17.0)
LYMPHS ABS: 2.8 10*3/uL (ref 0.7–4.0)
Lymphocytes Relative: 43 %
MCH: 31.7 pg (ref 26.0–34.0)
MCHC: 35.3 g/dL (ref 30.0–36.0)
MCV: 89.9 fL (ref 78.0–100.0)
MONO ABS: 0.7 10*3/uL (ref 0.1–1.0)
MONOS PCT: 10 %
Neutro Abs: 3.1 10*3/uL (ref 1.7–7.7)
Neutrophils Relative %: 45 %
PLATELETS: 201 10*3/uL (ref 150–400)
RBC: 3.97 MIL/uL — ABNORMAL LOW (ref 4.22–5.81)
RDW: 13 % (ref 11.5–15.5)
WBC: 6.7 10*3/uL (ref 4.0–10.5)

## 2017-06-04 LAB — PROTIME-INR
INR: 1.03
Prothrombin Time: 13.4 seconds (ref 11.4–15.2)

## 2017-06-04 NOTE — Progress Notes (Signed)
Progress Note   Patient ID: Juan Barber, male   DOB: 1946-02-23, 71 y.o.   MRN: [id number]  Chief Complaint  Patient presents with  . Hand Injury    left hand date of injury 04/20/17    HPI  71 year old malefractured his left hand and his right wrist.  Left hand was placed in cast right hand in splint. Sent for consultation with hand specialist. Surgery canceled secondary to cardiac issues.   ROS No outpatient prescriptions have been marked as taking for the 06/04/17 encounter (Office Visit) with Vickki Hearing, MD.    Allergies  Allergen Reactions  . Other     Pt reports "older" medication used for migraines  . Talwin [Pentazocine]     Sweating, rapid heartbeat     There were no vitals taken for this visit.  Physical Exam   Gen. appearance the patient's appearance is normal with normal grooming and  hygiene The patient is oriented to person place and time Mood and affect are normal  There were no vitals taken for this visit. 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  . Closed displaced fracture of shaft of fifth metacarpal bone of left hand with routine healing, subsequent encounter 04/20/2017  Yes   He presented in a delayed fashion. His x-ray shows no further displacement or angulation of his metacarpal fracture  He can remove the cast from the left hand and continue activities as tolerated with a follow-up with a hand specialist after his cardiac workup is complete  Fuller Canada, MD 06/04/2017 9:37 AM

## 2017-06-04 NOTE — Telephone Encounter (Signed)
Patient contacted pre-catheterization at [facility] scheduled for:  06/05/2017 @ 1200 Verified arrival time and place:  NT @ 1000 Confirmed AM meds to be taken pre-cath with sip of water: Take ASA Confirmed patient has responsible person to drive home post procedure and observe patient for 24 hours:  yes Addl concerns:  none

## 2017-06-05 ENCOUNTER — Observation Stay (HOSPITAL_COMMUNITY)
Admission: RE | Admit: 2017-06-05 | Discharge: 2017-06-06 | Disposition: A | Discharge status: Home / Self Care | Payer: Medicare Other | Source: Ambulatory Visit | Attending: Cardiology | Admitting: Cardiology

## 2017-06-05 ENCOUNTER — Encounter (HOSPITAL_COMMUNITY): Payer: Self-pay

## 2017-06-05 ENCOUNTER — Encounter (HOSPITAL_COMMUNITY)
Admission: RE | Disposition: A | Discharge status: Home / Self Care | Payer: Self-pay | Source: Ambulatory Visit | Attending: Cardiology

## 2017-06-05 DIAGNOSIS — Z7982 Long term (current) use of aspirin: Secondary | ICD-10-CM | POA: Diagnosis not present

## 2017-06-05 DIAGNOSIS — I480 Paroxysmal atrial fibrillation: Secondary | ICD-10-CM | POA: Diagnosis not present

## 2017-06-05 DIAGNOSIS — Z79899 Other long term (current) drug therapy: Secondary | ICD-10-CM | POA: Diagnosis not present

## 2017-06-05 DIAGNOSIS — I4819 Other persistent atrial fibrillation: Secondary | ICD-10-CM | POA: Diagnosis present

## 2017-06-05 DIAGNOSIS — I481 Persistent atrial fibrillation: Secondary | ICD-10-CM | POA: Insufficient documentation

## 2017-06-05 DIAGNOSIS — I428 Other cardiomyopathies: Principal | ICD-10-CM | POA: Insufficient documentation

## 2017-06-05 DIAGNOSIS — I420 Dilated cardiomyopathy: Secondary | ICD-10-CM | POA: Diagnosis not present

## 2017-06-05 DIAGNOSIS — I5022 Chronic systolic (congestive) heart failure: Secondary | ICD-10-CM | POA: Diagnosis not present

## 2017-06-05 HISTORY — PX: LEFT HEART CATH AND CORONARY ANGIOGRAPHY: CATH118249

## 2017-06-05 SURGERY — LEFT HEART CATH AND CORONARY ANGIOGRAPHY
Anesthesia: LOCAL

## 2017-06-05 MED ORDER — SODIUM CHLORIDE 0.9% FLUSH: 3.0000 mL | INTRAVENOUS | Status: DC | PRN

## 2017-06-05 MED ORDER — ASPIRIN 81 MG PO CHEW
CHEWABLE_TABLET | ORAL | Status: AC
  Administered 2017-06-05: 81 mg via ORAL
  Filled 2017-06-05: qty 1

## 2017-06-05 MED ORDER — ONDANSETRON HCL 4 MG/2ML IJ SOLN: 4.0000 mg | Freq: Four times a day (QID) | INTRAMUSCULAR | Status: DC | PRN

## 2017-06-05 MED ORDER — MIDAZOLAM HCL 2 MG/2ML IJ SOLN
INTRAMUSCULAR | Status: DC | PRN
  Administered 2017-06-05: 1 mg via INTRAVENOUS

## 2017-06-05 MED ORDER — SODIUM CHLORIDE 0.9 % IV SOLN: 250.0000 mL | INTRAVENOUS | Status: DC | PRN

## 2017-06-05 MED ORDER — ASPIRIN 81 MG PO CHEW
81.0000 mg | CHEWABLE_TABLET | ORAL | Status: AC
  Administered 2017-06-05: 81 mg via ORAL

## 2017-06-05 MED ORDER — SODIUM CHLORIDE 0.9% FLUSH
3.0000 mL | Freq: Two times a day (BID) | INTRAVENOUS | Status: DC
  Administered 2017-06-06: 3 mL via INTRAVENOUS

## 2017-06-05 MED ORDER — SODIUM CHLORIDE 0.9 % WEIGHT BASED INFUSION
3.0000 mL/kg/h | INTRAVENOUS | Status: DC
  Administered 2017-06-05: 3 mL/kg/h via INTRAVENOUS

## 2017-06-05 MED ORDER — SODIUM CHLORIDE 0.9 % WEIGHT BASED INFUSION: 1.0000 mL/kg/h | INTRAVENOUS | Status: DC

## 2017-06-05 MED ORDER — HEPARIN (PORCINE) IN NACL 2-0.9 UNIT/ML-% IJ SOLN
INTRAMUSCULAR | Status: AC
  Filled 2017-06-05: qty 1000

## 2017-06-05 MED ORDER — ASPIRIN EC 81 MG PO TBEC
81.0000 mg | DELAYED_RELEASE_TABLET | Freq: Every day | ORAL | Status: DC
  Filled 2017-06-05: qty 1

## 2017-06-05 MED ORDER — IOPAMIDOL (ISOVUE-370) INJECTION 76%
INTRAVENOUS | Status: AC
  Filled 2017-06-05: qty 100

## 2017-06-05 MED ORDER — LIDOCAINE HCL 2 % IJ SOLN
INTRAMUSCULAR | Status: DC | PRN
  Administered 2017-06-05: 15 mL

## 2017-06-05 MED ORDER — HEPARIN (PORCINE) IN NACL 2-0.9 UNIT/ML-% IJ SOLN
INTRAMUSCULAR | Status: AC | PRN
  Administered 2017-06-05: 1000 mL

## 2017-06-05 MED ORDER — METOPROLOL TARTRATE 50 MG PO TABS
50.0000 mg | ORAL_TABLET | Freq: Two times a day (BID) | ORAL | Status: DC
  Administered 2017-06-05: 50 mg via ORAL
  Filled 2017-06-05 (×2): qty 1

## 2017-06-05 MED ORDER — FENTANYL CITRATE (PF) 100 MCG/2ML IJ SOLN
INTRAMUSCULAR | Status: DC | PRN
  Administered 2017-06-05: 25 ug via INTRAVENOUS

## 2017-06-05 MED ORDER — FENTANYL CITRATE (PF) 100 MCG/2ML IJ SOLN
INTRAMUSCULAR | Status: AC
  Filled 2017-06-05: qty 2

## 2017-06-05 MED ORDER — SODIUM CHLORIDE 0.9% FLUSH: 3.0000 mL | Freq: Two times a day (BID) | INTRAVENOUS | Status: DC

## 2017-06-05 MED ORDER — LIDOCAINE HCL 2 % IJ SOLN
INTRAMUSCULAR | Status: AC
  Filled 2017-06-05: qty 20

## 2017-06-05 MED ORDER — ACETAMINOPHEN 325 MG PO TABS: 650.0000 mg | ORAL_TABLET | ORAL | Status: DC | PRN

## 2017-06-05 MED ORDER — IOPAMIDOL (ISOVUE-370) INJECTION 76%
INTRAVENOUS | Status: DC | PRN
  Administered 2017-06-05: 50 mL via INTRA_ARTERIAL

## 2017-06-05 MED ORDER — SODIUM CHLORIDE 0.9 % WEIGHT BASED INFUSION: 1.0000 mL/kg/h | INTRAVENOUS | Status: AC

## 2017-06-05 MED ORDER — MIDAZOLAM HCL 2 MG/2ML IJ SOLN
INTRAMUSCULAR | Status: AC
  Filled 2017-06-05: qty 2

## 2017-06-05 SURGICAL SUPPLY — 8 items
CATH INFINITI 5FR MULTPACK ANG (CATHETERS) ×1 IMPLANT
KIT HEART LEFT (KITS) ×2 IMPLANT
PACK CARDIAC CATHETERIZATION (CUSTOM PROCEDURE TRAY) ×2 IMPLANT
SHEATH PINNACLE 5F 10CM (SHEATH) ×1 IMPLANT
SYR MEDRAD MARK V 150ML (SYRINGE) ×2 IMPLANT
TRANSDUCER W/STOPCOCK (MISCELLANEOUS) ×2 IMPLANT
TUBING CIL FLEX 10 FLL-RA (TUBING) ×2 IMPLANT
WIRE EMERALD 3MM-J .035X150CM (WIRE) ×1 IMPLANT

## 2017-06-05 NOTE — Progress Notes (Signed)
No s/s of bleeding at sight 4 hours of bedrest completed

## 2017-06-05 NOTE — H&P (View-Only) (Signed)
Cardiology Office Note   Date:  05/21/2017   ID:  ZAKAI MILES, DOB [birth year], MRN [id number]  PCP:  Aliene Beams, MD  Cardiologist:   Charlton Haws, MD   No chief complaint on file.     History of Present Illness: Juan Barber is a 71 y.o. male who presents for consultation regarding irregular heart beat. Referred by Dr Tracie Harrier  History of seizures. Two recent falls ? Related to seizures with injury to back and bilateral wrists. Also feels weak  And lightheaded when its hot out Was to have surgery with Dr Roda Shutters for fracture of distal right radius Note made of history of atrial fibrillation ECG done 05/18/17 showed ST with multiple PAC;s and PVC;s   He is right handed and has arm in splint Apparently it is not healing correctly and needs surgical correction. Left wrist is in cast and healing Well. He is a non smoker No dyspnea Some palpitations. Two episodes of "syncope" that injured back and wrists occurred during heat when He says he got too hot No chest pain or palpitations at time.   He is vague about his history of PAF but has not been on anticoagulation   This patients CHA2DS2-VASc Score and unadjusted Ischemic Stroke Rate (% per year) is equal to 0.6 % stroke rate/year from a score of 1  Above score calculated as 1 point each if present [CHF, HTN, DM, Vascular=MI/PAD/Aortic Plaque, Age if 65-74, or Male] Above score calculated as 2 points each if present [Age > 75, or Stroke/TIA/TE]    Past Medical History:  Diagnosis Date  . Atrial fibrillation (HCC)   . Seizures (HCC)     Past Surgical History:  Procedure Laterality Date  . HERNIA REPAIR       Current Outpatient Prescriptions  Medication Sig Dispense Refill  . aspirin EC 81 MG tablet Take 81 mg by mouth daily.    . metoprolol tartrate (LOPRESSOR) 50 MG tablet Take 1 tablet (50 mg total) by mouth 2 (two) times daily. 180 tablet 3   No current facility-administered medications for this visit.      Allergies:   Other and Talwin [pentazocine]    Social History:  The patient  reports that he has never smoked. He has never used smokeless tobacco. He reports that he drinks alcohol. He reports that he does not use drugs.   Family History:  The patient's family history includes Heart attack in his mother.    ROS:  Please see the history of present illness.   Otherwise, review of systems are positive for none.   All other systems are reviewed and negative.    PHYSICAL EXAM: VS:  BP 120/66 (BP Location: Left Arm)   Pulse 67   Ht 5\' 10"  (1.778 m)   Wt 156 lb (70.8 kg)   SpO2 96%   BMI 22.38 kg/m  , BMI Body mass index is 22.38 kg/m. Affect appropriate Elderly black male  HEENT: normal Neck supple with no adenopathy JVP normal no bruits no thyromegaly Lungs clear with no wheezing and good diaphragmatic motion Heart:  S1/S2 SEM  murmur, no rub, gallop or click PMI normal Abdomen: benighn, BS positve, no tenderness, no AAA no bruit.  No HSM or HJR Distal pulses intact with no bruits No edema Neuro non-focal Skin warm and dry Right hand in splint and left in cast     EKG:  ST multiple PACls and PVC;s    Recent Labs: 05/09/2017: ALT  13; Hemoglobin 13.6; Platelets 299 05/18/2017: BUN 12; Creat 1.36; Potassium 4.0; Sodium 132; TSH 1.87    Lipid Panel No results found for: CHOL, TRIG, HDL, CHOLHDL, VLDL, LDLCALC, LDLDIRECT    Wt Readings from Last 3 Encounters:  05/21/17 156 lb (70.8 kg)  05/18/17 157 lb (71.2 kg)  05/11/17 177 lb (80.3 kg)      Other studies Reviewed: Additional studies/ records that were reviewed today include: Notes ER labs ECG and xrays .    ASSESSMENT AND PLAN:  1.  Pre-op need to establish baseline rhythm with monitor and r/o structural disease with echo.No need for stress testing at this time 2. Tachycardia appears to have both PAF and ST with frequent PAC;s/ PVCls will start lopressor 50 bid and do 7 day monitor If  Mostly afib will  rate control and if echo ok clear for surgery . CHA2VASC is only one but would start anticoagulation if [facility]  3. Seizures needs f/u with neuro EEG and possible CT head don't think this is related to his cardiac rhythm    Current medicines are reviewed at length with the patient today.  The patient does not have concerns regarding medicines.  The following changes have been made:  Lopresser 50 bid   Labs/ tests ordered today include: event monitor echo   Orders Placed This Encounter  Procedures  . Cardiac event monitor  . EKG 12-Lead  . ECHOCARDIOGRAM COMPLETE     Disposition:   FU with me or [place] after monitor and echo      Signed, Charlton HawsPeter Lis Savitt, MD  05/21/2017 2:41 PM    [facility] [address], [place], [place]  [address] Phone: [phone]; Fax: [phone]

## 2017-06-05 NOTE — Interval H&P Note (Signed)
History and Physical Interval Note:  06/05/2017 1:42 PM  Juan Barber  has presented today for surgery, with the diagnosis of cp - low ef  The various methods of treatment have been discussed with the patient and family. After consideration of risks, benefits and other options for treatment, the patient has consented to  Procedure(s): LEFT HEART CATH AND CORONARY ANGIOGRAPHY (N/A) as a surgical intervention .  The patient's history has been reviewed, patient examined, no change in status, stable for surgery.  I have reviewed the patient's chart and labs.  Questions were answered to the patient's satisfaction.   Cath Lab Visit (complete for each Cath Lab visit)  Clinical Evaluation Leading to the Procedure:   ACS: No.  Non-ACS:    Anginal Classification: No Symptoms  Anti-ischemic medical therapy: Minimal Therapy (1 class of medications)  Non-Invasive Test Results: No non-invasive testing performed  Prior CABG: No previous CABG        Theron Arista [facility] 06/05/2017 1:42 PM

## 2017-06-05 NOTE — Plan of Care (Signed)
Problem: Activity: Goal: Risk for activity intolerance will decrease Outcome: Not Progressing Bed rest with right Leg restricted.

## 2017-06-05 NOTE — Progress Notes (Signed)
Site area:Right femoral  Site Prior to Removal: level 0  Pressure Applied For  25 min  Minutes Beginning at  1420  Manual: yes  Patient Status During Pull:a/o, without complaints  Post Pull Groin Site: level 0  Post Pull Instructions Given:  yes, verbalized understanding  Post Pull Pulses Present:  yes, pedal present right  Dressing Applied:  Gauze with teagderm  Comments:

## 2017-06-05 NOTE — Plan of Care (Signed)
Problem: Safety: Goal: Ability to remain free from injury will improve Outcome: Progressing No bleeding

## 2017-06-05 NOTE — Plan of Care (Signed)
Problem: Fluid Volume: Goal: Ability to maintain a balanced intake and output will improve Outcome: Progressing Has been NPO for heart cath. Ordered meal for this evening

## 2017-06-06 ENCOUNTER — Encounter (HOSPITAL_COMMUNITY): Payer: Self-pay | Admitting: Cardiology

## 2017-06-06 DIAGNOSIS — I5022 Chronic systolic (congestive) heart failure: Secondary | ICD-10-CM | POA: Diagnosis not present

## 2017-06-06 DIAGNOSIS — I428 Other cardiomyopathies: Secondary | ICD-10-CM | POA: Diagnosis not present

## 2017-06-06 MED ORDER — RIVAROXABAN 20 MG PO TABS: 20.0000 mg | ORAL_TABLET | Freq: Every day | ORAL | Status: DC

## 2017-06-06 MED ORDER — RIVAROXABAN 20 MG PO TABS: 20.0000 mg | ORAL_TABLET | Freq: Every day | ORAL | 11 refills | Status: DC

## 2017-06-06 MED ORDER — RIVAROXABAN (XARELTO) EDUCATION KIT FOR AFIB PATIENTS
PACK | Freq: Once | Status: DC
  Filled 2017-06-06: qty 1

## 2017-06-06 MED ORDER — RIVAROXABAN 20 MG PO TABS: 20.0000 mg | ORAL_TABLET | Freq: Every day | ORAL | 0 refills | Status: DC

## 2017-06-06 MED ORDER — LOSARTAN POTASSIUM 25 MG PO TABS
25.0000 mg | ORAL_TABLET | Freq: Every day | ORAL | Status: DC
  Filled 2017-06-06: qty 1

## 2017-06-06 MED ORDER — LOSARTAN POTASSIUM 25 MG PO TABS: 25.0000 mg | ORAL_TABLET | Freq: Every day | ORAL | 3 refills | Status: DC

## 2017-06-06 NOTE — Progress Notes (Signed)
Patient has order to discharge home. Discharge instructions reviewed; pt with no questions or concerns. Xarelto card given to pt. Pt stable and walked out of hospital with friend.

## 2017-06-06 NOTE — Discharge Summary (Signed)
Discharge Summary    Patient ID: Juan Barber,  MRN: [id number], DOB/AGE: [birth year] 71 y.o.  Admit date: 06/05/2017 Discharge date: 06/06/2017  Primary Care Provider: Aliene Beams Primary Cardiologist: Dr. Eden Emms  Discharge Diagnoses    Principal Problem:   Chronic systolic CHF (congestive heart failure) (HCC) Active Problems:   Persistent atrial fibrillation (HCC)   Dilated cardiomyopathy (HCC)   Nonischemic cardiomyopathy (HCC)   History of Present Illness     Juan Barber is a 71 y.o. male with past medical history of seizure disorder and no prior cardiac history who was recently referred to see Dr. Eden Emms due to an EKG showing atrial fibrillation.   He was started on Lopressor 50mg  BID and placed on a monitor. This showed PVC's with no recurrence of atrial fibrillation. An echocardiogram was obtained and showed a reduced EF of 30-35% with mild MR. With his newly reduced ejection fraction, a cardiac catheterization was recommended for definitive evaluation and he presented to [facility] on 06/05/2017 for the procedure.   Hospital Course     Consultants: None  His catheterization showed normal coronary arteries, therefore his reduced EF was most consistent with a nonischemic cardiomyopathy. He was monitored overnight for further titration of his medications.   The following morning, he denied any complications regarding his cath site. Respiratory status was normal and he denied any episodes of chest discomfort or palpitations. He was continued on Metoprolol Tartrate 50mg  BID (consider switching to Succinate as an outpatient in the setting of his cardiomyopathy) along with Losartan 25mg  daily being added to his medication regimen. He will need a repeat echocardiogram in 3 months to reassess LV function. Due to his CHA2DS2-VASc Score of 2, he was started on Xarelto 20mg  daily for anticoagulation. He was cleared to proceed with his upcoming wrist surgery and can hold  Xarelto for 48 hours prior to the surgery.   He was last examined by Dr. Royann Shivers and deemed stable for discharge. Cardiology follow-up has been arranged. He will need a repeat BMET at that time due to initiation of Losartan this admission. He was discharged home in good condition.   _____________  Discharge Vitals Blood pressure 124/64, pulse 67, temperature 98.4 F (36.9 C), temperature source Oral, resp. rate 18, height 5\' 10"  (1.778 m), weight 159 lb 3.2 oz (72.2 kg), SpO2 100 %.  Filed Weights   06/05/17 1043 06/06/17 0320  Weight: 162 lb (73.5 kg) 159 lb 3.2 oz (72.2 kg)    Labs & Radiologic Studies     CBC  Recent Labs  06/04/17 1012  WBC 6.7  NEUTROABS 3.1  HGB 12.6*  HCT 35.7*  MCV 89.9  PLT 201   Basic Metabolic Panel  Recent Labs  06/04/17 1012  NA 129*  K 4.5  CL 97*  CO2 25  GLUCOSE 127*  BUN 14  CREATININE 1.23  CALCIUM 8.7*   Liver Function Tests  Recent Labs  06/04/17 1012  AST 26  ALT 13*  ALKPHOS 74  BILITOT 1.1  PROT 7.5  ALBUMIN 3.2*   No results for input(s): LIPASE, AMYLASE in the last 72 hours. Cardiac Enzymes No results for input(s): CKTOTAL, CKMB, CKMBINDEX, TROPONINI in the last 72 hours. BNP Invalid input(s): POCBNP D-Dimer No results for input(s): DDIMER in the last 72 hours. Hemoglobin A1C No results for input(s): HGBA1C in the last 72 hours. Fasting Lipid Panel No results for input(s): CHOL, HDL, LDLCALC, TRIG, CHOLHDL, LDLDIRECT in the last 72 hours.  Thyroid Function Tests No results for input(s): TSH, T4TOTAL, T3FREE, THYROIDAB in the last 72 hours.  Invalid input(s): FREET3  Dg Chest 2 View  Result Date: 05/18/2017 CLINICAL DATA:  71 year old male with a history of preoperative clearance. EXAM: CHEST  2 VIEW COMPARISON:  02/18/1999 all FINDINGS: Cardiomediastinal silhouette within normal limits. No evidence of central vascular congestion. No pneumothorax or pleural effusion. No confluent airspace disease. No  acute displaced fracture IMPRESSION: Negative for acute cardiopulmonary disease Electronically Signed   By: Gilmer MorJaime  Wagner D.O.   On: 05/18/2017 16:16   Dg Lumbar Spine Complete  Result Date: 05/09/2017 CLINICAL DATA:  71 year old male with back pain for approximately 3 weeks. Status post fall 2 weeks ago. EXAM: LUMBAR SPINE - COMPLETE 4+ VIEW COMPARISON:  02/02/2017 FINDINGS: There is interval worsening of a wedge compression fracture at L1, now demonstrating greater than 50% loss of height. There is interval resorption of the anterior vertebral body. The remaining vertebral bodies and disc spaces appear unremarkable. There is mild facet hypertrophy at the lower lumbar spine. Fistula lies visceral structures grossly unremarkable.  L IMPRESSION: Interval progression of a wedge compression fracture L1 compared to study dated 02/02/2017, now demonstrating greater than 50% loss of height. There is interval reabsorption of the anterior vertebral body, and underlying pathologic lesion is difficult to exclude. Further evaluation with MRI is recommended. Electronically Signed   By: Sande BrothersSerena  Chacko M.D.   On: 05/09/2017 11:18   Dg Wrist Complete Left  Result Date: 05/09/2017 CLINICAL DATA:  89 minutes year old male with pain status post fall 2 weeks ago. EXAM: LEFT WRIST - COMPLETE 3+ VIEW COMPARISON:  None. FINDINGS: There is oblique fracture involving the fifth metacarpal shaft. Fracture fragments do not appear to involve the articular surface. Carpal bones are intact. No significant soft tissue swelling about the wrist. IMPRESSION: Oblique fracture involving the fifth metacarpal midshaft. Dedicated views of the left hand recommended. Electronically Signed   By: Sande BrothersSerena  Chacko M.D.   On: 05/09/2017 11:08   Dg Wrist Complete Right  Result Date: 05/11/2017 Rad[facility] by Dr. Romeo AppleHarrison. Follow-up x-ray for distal radius fracture right upper extremity The fracture fragments are  osteopenic as is a portion of the carpus There is a volar subluxation of the distal radius and carpus volar aspect consistent with a Smith type II fracture which has not changed in position since the original x-ray Impression Smith fracture type II volar subluxation  Dg Wrist Complete Right  Result Date: 05/09/2017 CLINICAL DATA:  71 year old male status post fall two weeks ago. EXAM: RIGHT WRIST - COMPLETE 3+ VIEW COMPARISON:  None. FINDINGS: Transverse fractures identified at the distal radial metadiaphysis. There is mild volar angulation and displacement of the distal fracture fragment. The fracture does not appear to involve the articular surface. Note is made of diffuse osteopenia. Remaining osseous structures intact. There is soft tissue swelling about the wrist. IMPRESSION: Transverse fracture involving the distal radial metadiaphysis with mild volar angulation and displacement of the distal fracture fragment. Electronically Signed   By: Sande BrothersSerena  Chacko M.D.   On: 05/09/2017 11:07   Mr Lumbar Spine Wo Contrast  Result Date: 05/09/2017 CLINICAL DATA:  Chronic back pain. Question worsening of L1 compression fracture. EXAM: MRI LUMBAR SPINE WITHOUT CONTRAST TECHNIQUE: Multiplanar, multisequence MR imaging of the lumbar spine was performed. No intravenous contrast was administered. COMPARISON:  Radiography from earlier today FINDINGS: Segmentation:  Standard Alignment:  Negative for listhesis Vertebrae: There is a L1  compression fracture that was first seen February 02, 2017. The fracture does show progressive collapse compared to that time, although posterior cortex is intact and there is no retropulsion. Centrally, there is severe height loss and even a defect in the central body, with touching discs. Marrow edema is minimal. The L1 body is diffusely sclerotic. On sagittal T1 weighted imaging there appears to be ventral soft tissue extra osseous density, but this is not re- demonstrated on axial images. No  epidural, foraminal, or pedicular signal abnormality. Elsewhere the marrow is heterogeneous without acute fracture, discitis, or aggressive bone lesion. Conus medullaris: Extends to the L1 level and appears normal. Paraspinal and other soft tissues: Thickened trabeculated bladder with a posterior diverticulum, suggesting chronic outlet obstruction. Disc levels: T12- L1: Mild far-lateral disc bulging.  No impingement L1-L2: Mild disc bulging and narrowing.  No compressive stenosis L2-L3: Mild rightward disc bulging.  No impingement L3-L4: Mild annulus bulging.  No impingement L4-L5: Degenerative disc narrowing and bulging with a central protrusion. Bilateral inferior foraminal effacement, moderate stenosis. Mild thecal sac narrowing. L5-S1:Unremarkable. These results were called by telephone at the time of interpretation on 05/09/2017 at 12:27 pm to Dr. Pauline Aus , who verbally acknowledged these results. IMPRESSION: 1. L1 compression fracture with advanced, progressive central and anterior height loss when compared to 02/02/2017 radiography. There is no significant marrow edema to suggest unhealed fracture component. Diffuse body sclerosis and demineralization is concerning for an underlying lesion, but posttraumatic re- absorption and sclerosis is also possible. If malignancy history or concerning clinical features for occult malignancy, a biopsy could be obtained. Otherwise, short interval follow-up MRI with contrast is recommended (such as 6 to 8 weeks). 2. L4-5 disc bulging and moderate bilateral foraminal narrowing. Elsewhere degenerative changes are mild. Electronically Signed   By: Marnee Spring M.D.   On: 05/09/2017 12:35   Dg Hand Complete Left  Result Date: 06/04/2017 [facility] Radiology report dictated by Dr. Romeo Apple Fracture hand - left Radiology report x-rays of the hand AP lateral and oblique x-rays of the left  hand Findings: shaft fracture left hand, minimal angulation, mild  translation no angulation , no callus seen IMPRESSION : MINIMALLY DISPALCED FRACTURE RT 5 TH METACARPAL   Dg Hand Complete Left  Result Date: 05/11/2017 Radiology report at [facility] Dr. Romeo Apple dictating 3 views left hand Left hand fracture There is a comminuted fracture of the left fifth metacarpal with slight medial lateral displacement no major angulation Healing fracture fifth metacarpal no angulation slight displacement    Diagnostic Studies/Procedures     Cardiac Catheterization: 06/05/2017   LV end diastolic pressure is normal.   1. Normal coronary arteries 2. Normal LVEDP  Plan: will observe overnight in hospital since patient lives alone without support. DC in am. Should be able to proceed with wrist surgery.   Disposition   Pt is being discharged home today in good condition.  Follow-up Plans & Appointments    Follow-up Information    Jodelle Gross, NP Follow up on 06/21/2017.   Specialties:  Nurse Practitioner, Radiology, Cardiology Why:  Cardiology Follow-Up on 06/21/2017 at 1:30PM.  Contact information: [address] [place] [address] [phone]          Discharge Instructions    Discharge instructions    Complete by:  As directed    PLEASE REMEMBER TO BRING ALL OF YOUR MEDICATIONS TO EACH OF YOUR FOLLOW-UP OFFICE VISITS.  PLEASE ATTEND ALL SCHEDULED FOLLOW-UP APPOINTMENTS.   Activity: Increase activity  slowly as tolerated. You may shower, but no soaking baths (or swimming) for 1 week. No driving for 24 hours. No lifting over 5 lbs for 1 week. No sexual activity for 1 week.   You May Return to Work: in 1 week (if applicable)  Wound Care: You may wash cath site gently with soap and water. Keep cath site clean and dry. If you notice pain, swelling, bleeding or pus at your cath site, please call [phone].   Increase activity slowly    Complete by:  As directed       Discharge Medications     Medication List      STOP taking these medications   aspirin EC 81 MG tablet     TAKE these medications   losartan 25 MG tablet Commonly known as:  COZAAR Take 1 tablet (25 mg total) by mouth daily.   metoprolol tartrate 50 MG tablet Commonly known as:  LOPRESSOR Take 1 tablet (50 mg total) by mouth 2 (two) times daily.   rivaroxaban 20 MG Tabs tablet Commonly known as:  XARELTO Take 1 tablet (20 mg total) by mouth daily with supper.        Allergies Allergies  Allergen Reactions  . Other     Pt reports "older" medication used for migraines  . Talwin [Pentazocine]     Sweating, rapid heartbeat     Outstanding Labs/Studies   BMET at Follow-Up Appointment.   Duration of Discharge Encounter   Greater than 30 minutes including physician time.  Signed, Ellsworth Lennox, PA-C 06/06/2017, 7:12 PM

## 2017-06-06 NOTE — Progress Notes (Signed)
Progress Note  Patient Name: Juan Barber Date of Encounter: 06/06/2017  Primary Cardiologist: Eden Emms  Subjective   No complaints at right femoral cath access site. No hematoma.  Inpatient Medications    Scheduled Meds: . aspirin EC  81 mg Oral Daily  . metoprolol tartrate  50 mg Oral BID  . sodium chloride flush  3 mL Intravenous Q12H   Continuous Infusions: . sodium chloride     PRN Meds: sodium chloride, acetaminophen, ondansetron (ZOFRAN) IV, sodium chloride flush   Vital Signs    Vitals:   06/05/17 2201 06/05/17 2232 06/06/17 0320 06/06/17 0804  BP:  128/74  124/64  Pulse: 81 77  67  Resp:    18  Temp:    98.4 F (36.9 C)  TempSrc:    Oral  SpO2:    100%  Weight:   159 lb 3.2 oz (72.2 kg)   Height:        Intake/Output Summary (Last 24 hours) at 06/06/17 0946 Last data filed at 06/06/17 0803  Gross per 24 hour  Intake                0 ml  Output              225 ml  Net             -225 ml   Filed Weights   06/05/17 1043 06/06/17 0320  Weight: 162 lb (73.5 kg) 159 lb 3.2 oz (72.2 kg)    Telemetry    Sinus rhythm - Personally Reviewed  ECG    No new tracing - Personally Reviewed  Physical Exam  Comfortable, smiling GEN: No acute distress.   Neck: No JVD Cardiac: RRR, no murmurs, rubs, or gallops.  No bleeding/hematoma/bruit R groin. Respiratory: Clear to auscultation bilaterally. GI: Soft, nontender, non-distended  MS: No edema; No deformity. R wrist splint Neuro:  Nonfocal  Psych: Normal affect   Labs    Chemistry Recent Labs Lab 06/04/17 1012  NA 129*  K 4.5  CL 97*  CO2 25  GLUCOSE 127*  BUN 14  CREATININE 1.23  CALCIUM 8.7*  PROT 7.5  ALBUMIN 3.2*  AST 26  ALT 13*  ALKPHOS 74  BILITOT 1.1  GFRNONAA 57*  GFRAA >60  ANIONGAP 7     Hematology Recent Labs Lab 06/04/17 1012  WBC 6.7  RBC 3.97*  HGB 12.6*  HCT 35.7*  MCV 89.9  MCH 31.7  MCHC 35.3  RDW 13.0  PLT 201    Cardiac Studies    06/05/2017 LEFT HEART CATH AND CORONARY ANGIOGRAPHY  Conclusion     LV end diastolic pressure is normal.   1. Normal coronary arteries 2. Normal LVEDP    echo 05/22/2017  - Technical notes: Diffuse hypokinesis patient in rapid afib and PVCs during study - Left ventricle: The cavity size was moderately dilated. Wall thickness was normal. Systolic function was moderately to severely reduced. The estimated ejection fraction was in the range of 30% to 35%. The study is not technically sufficient to allow evaluation of LV diastolic function. - Mitral valve: There was mild regurgitation. - Atrial septum: No defect or patent foramen ovale was identified.  Patient Profile     71 y.o. male with nonischemic (tachycardia-related?) cardiomyopathy, paroxysmal atrial fibrillation, without overt CHF and normal filling pressures on cath. Needs R wrist surgery.  Assessment & Plan    1. CMP: may be tachy-related or idiopathic. On metoprolol. Add low dose  ARB. Reevaluate EF in 3 months. Does not need diuretics. 2. PAF: CHADSVasc 2 (age, low LVEF). Start DOAC. Can stop Xarelto 2-3 days before wrist surgery.Continue beta blocker. 3. Preop risk eval: asymptomatic from CHF standpoint. Low risk for major periop complications with wrist surgery. Can stop Xarelto 2-3 days before surgery.  For questions or updates, please contact [facility] Please consult [email or web address] for contact info under Cardiology/STEMI.      Signed, Thurmon FairMihai Sarh Kirschenbaum, MD  06/06/2017, 9:46 AM

## 2017-06-06 NOTE — Progress Notes (Signed)
No signs of bleeding or bruising at cath site. Pt vitals are stable, no complaints.   Elsie Lincoln, RN

## 2017-06-06 NOTE — Progress Notes (Signed)
ANTICOAGULATION CONSULT NOTE - Initial Consult  Pharmacy Consult for Xarelto Indication:  Nonvalvular Atrial Fibrilation  Allergies  Allergen Reactions  . Other     Pt reports "older" medication used for migraines  . Talwin [Pentazocine]     Sweating, rapid heartbeat    Patient Measurements: Height: 5\' 10"  (177.8 cm) Weight: 159 lb 3.2 oz (72.2 kg) (scale b) IBW/kg (Calculated) : 73  Vital Signs: Temp: 98.4 F (36.9 C) (10/24 0804) Temp Source: Oral (10/24 0804) BP: 124/64 (10/24 0804) Pulse Rate: 67 (10/24 0804)  Labs:  Recent Labs  06/04/17 1012  HGB 12.6*  HCT 35.7*  PLT 201  LABPROT 13.4  INR 1.03  CREATININE 1.23    Estimated Creatinine Clearance: 56.3 mL/min (by C-G formula based on SCr of 1.23 mg/dL).   Medical History: Past Medical History:  Diagnosis Date  . Atrial fibrillation (HCC)   . Seizures (HCC)     Medications:  Prescriptions Prior to Admission  Medication Sig Dispense Refill Last Dose  . aspirin EC 81 MG tablet Take 81 mg by mouth daily.   06/04/2017 at Unknown time  . metoprolol tartrate (LOPRESSOR) 50 MG tablet Take 1 tablet (50 mg total) by mouth 2 (two) times daily. 180 tablet 3 06/04/2017 at Unknown time   Scheduled:  . losartan  25 mg Oral Daily  . metoprolol tartrate  50 mg Oral BID  . rivaroxaban   Does not apply Once  . 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   CHADSVasc 2 (age, low LVEF).  Pharmacy consulted to start Xarelto. Needs R wrist surgery. Cardiolology notes we can stop Xarelto 2-3 days before wrist surgery.   CBC on 10/22 H/H 12.6/35.7 and PLTC 201k  SCr is 1.23, estimated CrCl ~ 56 ml/min   Goal of Therapy:  Monitor platelets by anticoagulation protocol: Yes   Plan:  Start Xarelto 20mg  once daily  Monitor daily for s/sx of bleeding, renal function changes. Educate patient prior to  discharge  Thank you for allowing pharmacy to be part of this patients care team. Noah Delaine, RPh Clinical Pharmacist Pager: [phone]-330p [phone] 330p-1030p phone [phone] or [phone] Main pharmacy [phone] 06/06/2017,10:51 AM

## 2017-06-06 NOTE — Discharge Instructions (Signed)

## 2017-06-21 ENCOUNTER — Encounter: Payer: Self-pay | Admitting: *Deleted

## 2017-06-21 ENCOUNTER — Encounter: Payer: Self-pay | Admitting: Adult Health

## 2017-06-21 ENCOUNTER — Ambulatory Visit (INDEPENDENT_AMBULATORY_CARE_PROVIDER_SITE_OTHER): Payer: Medicare Other | Admitting: Adult Health

## 2017-06-21 VITALS — BP 136/64 | HR 68 | Ht 70.5 in | Wt 167.0 lb

## 2017-06-21 DIAGNOSIS — S62101S Fracture of unspecified carpal bone, right wrist, sequela: Secondary | ICD-10-CM | POA: Diagnosis not present

## 2017-06-21 DIAGNOSIS — I5042 Chronic combined systolic (congestive) and diastolic (congestive) heart failure: Secondary | ICD-10-CM | POA: Diagnosis not present

## 2017-06-21 DIAGNOSIS — I4821 Permanent atrial fibrillation: Secondary | ICD-10-CM

## 2017-06-21 DIAGNOSIS — I482 Chronic atrial fibrillation: Secondary | ICD-10-CM

## 2017-06-21 NOTE — Progress Notes (Signed)
Cardiology Office Note   Date:  06/21/2017   ID:  Juan Barber, DOB 03/12/1946, MRN [id number]  PCP:  Aliene Beams, MD  Cardiologist: Charlton Haws, MD  Chief Complaint  Patient presents with  . Hospitalization Follow-up  . Congestive Heart Failure    History of Present Illness: Juan Barber is a 71 y.o. male who presents for post hospital follow-up after admission for acute on chronic systolic CHF, persistent atrial fibrillation, nonischemic cardiomyopathy, with dilated cardiomyopathy.  Echocardiogram revealed reduced ejection fraction of 30% to 35% with mild MR.  Due to significantly reduced ejection fraction the patient was scheduled for cardiac catheterization.  Cardiac catheterization revealed normal coronary arteries.  The patient was continued on metoprolol tartrate 50 mg twice daily, losartan 25 mg, and begin on Xarelto 20 mg daily due to elevated CHADS VASC Score of 2.  Patient was to be scheduled for wrist repair, and was advised to stop Xarelto 48 hours prior to procedure.  Comes today with significant confusion about his medications, holding Xarelto, and whether he can proceed with surgical repair of his wrist.  Despite reviewing the discharge summary, which clearly states that he is able to proceed with the surgery, he apparently was unaware that he could do so.  He was also under the impression that he would have to stop the Xarelto for 2 weeks instead of 2 days.  He comes today without any cardiac complaints, is medically compliant.  He does not remember which medications he is taking other than "what is written on the paper" and I have verified that he is taking them correctly.   Past Medical History:  Diagnosis Date  . Atrial fibrillation (HCC)   . Seizures (HCC)     Past Surgical History:  Procedure Laterality Date  . HERNIA REPAIR       Current Outpatient Medications  Medication Sig Dispense Refill  . losartan (COZAAR) 25 MG tablet Take 1 tablet (25 mg  total) by mouth daily. 90 tablet 3  . metoprolol tartrate (LOPRESSOR) 50 MG tablet Take 1 tablet (50 mg total) by mouth 2 (two) times daily. 180 tablet 3  . rivaroxaban (XARELTO) 20 MG TABS tablet Take 1 tablet (20 mg total) by mouth daily with supper. 30 tablet 11   No current facility-administered medications for this visit.     Allergies:   Other and Talwin [pentazocine]    Social History:  The patient  reports that  has never smoked. he has never used smokeless tobacco. He reports that he drinks alcohol. He reports that he does not use drugs.   Family History:  The patient's family history includes Heart attack in his mother.    ROS: All other systems are reviewed and negative. Unless otherwise mentioned in H&P    PHYSICAL EXAM: VS:  BP 136/64 (BP Location: Left Arm)   Pulse 68   Ht 5' 10.5" (1.791 m)   Wt 167 lb (75.8 kg)   SpO2 92%   BMI 23.62 kg/m  , BMI Body mass index is 23.62 kg/m. GEN: Well nourished, well developed, in no acute distress  HEENT: normal  Neck: no JVD, carotid bruits, or masses Cardiac: RRR; no murmurs, rubs, or gallops,no edema  Respiratory: Clear to auscultation bilaterally, normal work of breathing GI: soft, nontender, nondistended, + BS MS: no deformity or atrophy Right wrist brace in place. Skin: warm and dry, no rash Neuro:  Strength and sensation are intact Psych: euthymic mood, full affect   Recent Labs:  05/18/2017: TSH 1.87 06/04/2017: ALT 13; BUN 14; Creatinine, Ser 1.23; Hemoglobin 12.6; Platelets 201; Potassium 4.5; Sodium 129    Lipid Panel No results found for: CHOL, TRIG, HDL, CHOLHDL, VLDL, LDLCALC, LDLDIRECT    Wt Readings from Last 3 Encounters:  06/21/17 167 lb (75.8 kg)  06/06/17 159 lb 3.2 oz (72.2 kg)  06/04/17 162 lb (73.5 kg)      Other studies Reviewed: Cardiac Catheterization: 06/05/2017   LV end diastolic pressure is normal.  1. Normal coronary arteries 2. Normal LVEDP  Plan: will observe overnight  in hospital since patient lives alone without support. DC in am. Should be able to proceed with wrist surgery.   Echocardiogram 10/9/ 2018.  Study Conclusions  - Technical notes: Diffuse hypokinesis patient in rapid afib and   PVC;s during study - Left ventricle: The cavity size was moderately dilated. Wall   thickness was normal. Systolic function was moderately to   severely reduced. The estimated ejection fraction was in the   range of 30% to 35%. The study is not technically sufficient to   allow evaluation of LV diastolic function. - Mitral valve: There was mild regurgitation. - Atrial septum: No defect or patent foramen ovale was identified.   ASSESSMENT AND PLAN:  1.  Chronic systolic CHF: Nonischemic.  The patient had a cardiac catheterization on recent hospitalization revealing normal coronary arteries.  He is continued on metoprolol 50 mg twice daily, losartan 25 mg daily.  I reviewed his recent labs.  GFR is greater than 60, with a creatinine of 1.26.  I do not believe that he is a candidate for Entresto at this time.  Heart rate is well controlled.    I would like to see his blood pressure lower in the setting of reduced EF.  Continue on medical therapy and will need to have a follow-up echocardiogram in 3 months.  I believe he is optimized on his medical therapy currently, but could possibly have higher dose of losartan for better blood pressure control.  He is currently in a lot of pain.  During hospitalization blood pressure was much better controlled.  Will follow  2.  Atrial fibrillation: He remains on Xarelto 20 mg daily.  He denies any complaints of bleeding or excessive bruising.  Heart rate is currently well controlled.  Will need to hold Xarelto for 48 hours prior to surgical repair of his wrist.  This is been reinforced with the patient to clarify for him the timing.  3.  Fractured right wrist:  He is to see Dr. Roda ShuttersXu, orthopedic surgeon for further evaluation and  timing of surgical repair.  A letter has been sent to their office releasing him for surgery with instructions concerning Xarelto.  Will defer to orthopedic surgeon for follow-up appointment scheduling.   Current medicines are reviewed at length with the patient today.    Labs/ tests ordered today include:   Bettey MareKathryn M. Liborio NixonLawrence DNP, ANP, AACC   06/21/2017 3:24 PM    [facility] [address], [place], [place] [address] Phone: [phone]; Fax: [phone]

## 2017-06-21 NOTE — Patient Instructions (Signed)
Medication Instructions:  Your physician recommends that you continue on your current medications as directed. Please refer to the Current Medication list given to you today.   Labwork: NONE  Testing/Procedures: NONE   Follow-Up: Your physician recommends that you schedule a follow-up appointment in: February    Any Other Special Instructions Will Be Listed Below (If Applicable). You have been cleared for surgery.     If you need a refill on your cardiac medications before your next appointment, please call your pharmacy.  Thank you for choosing [facility]!

## 2017-07-16 ENCOUNTER — Ambulatory Visit: Payer: Medicare Other | Admitting: Adult Health

## 2017-07-25 ENCOUNTER — Telehealth: Payer: Self-pay | Admitting: Student

## 2017-07-25 MED ORDER — LOSARTAN POTASSIUM 25 MG PO TABS: 25.0000 mg | ORAL_TABLET | Freq: Every day | ORAL | 3 refills | Status: DC

## 2017-07-25 MED ORDER — RIVAROXABAN 20 MG PO TABS: 20.0000 mg | ORAL_TABLET | Freq: Every day | ORAL | 11 refills | Status: DC

## 2017-07-25 NOTE — Telephone Encounter (Signed)
Needing a refill on his Xarelto he is completely out, since yesterday and his Losartan sent to [facility].

## 2017-07-25 NOTE — Telephone Encounter (Signed)
Medication refills sent to [facility].

## 2017-09-20 ENCOUNTER — Encounter: Payer: Self-pay | Admitting: Student

## 2017-09-20 NOTE — Progress Notes (Deleted)
Cardiology Office Note    Date:  09/20/2017   ID:  Juan Barber, DOB 07/13/1946, MRN [id number]  PCP:  Aliene Beams, MD  Cardiologist: Dr. Eden Emms   No chief complaint on file.   History of Present Illness:    Juan Barber is a 72 y.o. male with past medical history of chronic combined systolic and diastolic CHF (EF 46-96% by echo in 05/2017 with cardiac catheterization at that time showing normal cors), nonischemic cardiomyopathy, PAF (on Xarelto), HTN, and HLD who presents to the office today for 74-month follow-up.   He was last examined by Joni Reining, DNP in 06/2017 and denied any recent chest pain or dyspnea on exertion at that time. He had been holding Xarelto for 2 weeks for an upcoming surgery despite being told he would only need to hold the medication for 48 hours. He was continued on Lopressor and Losartan at that time with plans for a repeat echocardiogram in 3 months.     Past Medical History:  Diagnosis Date  . Atrial fibrillation (HCC)   . Seizures (HCC)     Past Surgical History:  Procedure Laterality Date  . HERNIA REPAIR    . LEFT HEART CATH AND CORONARY ANGIOGRAPHY N/A 06/05/2017   Procedure: LEFT HEART CATH AND CORONARY ANGIOGRAPHY;  Surgeon: [place], Peter M, MD;  Location: [facility] INVASIVE CV LAB;  Service: Cardiovascular;  Laterality: N/A;    Current Medications: Outpatient Medications Prior to Visit  Medication Sig Dispense Refill  . losartan (COZAAR) 25 MG tablet Take 1 tablet (25 mg total) by mouth daily. 90 tablet 3  . metoprolol tartrate (LOPRESSOR) 50 MG tablet Take 1 tablet (50 mg total) by mouth 2 (two) times daily. 180 tablet 3  . rivaroxaban (XARELTO) 20 MG TABS tablet Take 1 tablet (20 mg total) by mouth daily with supper. 30 tablet 11   No facility-administered medications prior to visit.      Allergies:   Other and Talwin [pentazocine]   Social History   Socioeconomic History  . Marital status: Divorced    Spouse name: Not on  file  . Number of children: Not on file  . Years of education: Not on file  . Highest education level: Not on file  Social Needs  . Financial resource strain: Not on file  . Food insecurity - worry: Not on file  . Food insecurity - inability: Not on file  . Transportation needs - medical: Not on file  . Transportation needs - non-medical: Not on file  Occupational History  . Not on file  Tobacco Use  . Smoking status: Never Smoker  . Smokeless tobacco: Never Used  Substance and Sexual Activity  . Alcohol use: Yes    Comment: occassional  . Drug use: No  . Sexual activity: Not on file  Other Topics Concern  . Not on file  Social History Narrative  . Not on file     Family History:  The patient's ***family history includes Heart attack in his mother.   Review of Systems:   Please see the history of present illness.     General:  No chills, fever, night sweats or weight changes.  Cardiovascular:  No chest pain, dyspnea on exertion, edema, orthopnea, palpitations, paroxysmal nocturnal dyspnea. Dermatological: No rash, lesions/masses Respiratory: No cough, dyspnea Urologic: No hematuria, dysuria Abdominal:   No nausea, vomiting, diarrhea, bright red blood per rectum, melena, or hematemesis Neurologic:  No visual changes, wkns, changes in mental status. All  other systems reviewed and are otherwise negative except as noted above.   Physical Exam:    VS:  There were no vitals taken for this visit.   General: Well developed, well nourished,male appearing in no acute distress. Head: Normocephalic, atraumatic, sclera non-icteric, no xanthomas, nares are without discharge.  Neck: No carotid bruits. JVD not elevated.  Lungs: Respirations regular and unlabored, without wheezes or rales.  Heart: ***Regular rate and rhythm. No S3 or S4.  No murmur, no rubs, or gallops appreciated. Abdomen: Soft, non-tender, non-distended with normoactive bowel sounds. No hepatomegaly. No  rebound/guarding. No obvious abdominal masses. Msk:  Strength and tone appear normal for age. No joint deformities or effusions. Extremities: No clubbing or cyanosis. No edema.  Distal pedal pulses are 2+ bilaterally. Neuro: Alert and oriented X 3. Moves all extremities spontaneously. No focal deficits noted. Psych:  Responds to questions appropriately with a normal affect. Skin: No rashes or lesions noted  Wt Readings from Last 3 Encounters:  06/21/17 167 lb (75.8 kg)  06/06/17 159 lb 3.2 oz (72.2 kg)  06/04/17 162 lb (73.5 kg)        Studies/Labs Reviewed:   EKG:  EKG is*** ordered today.  The ekg ordered today demonstrates ***  Recent Labs: 05/18/2017: TSH 1.87 06/04/2017: ALT 13; BUN 14; Creatinine, Ser 1.23; Hemoglobin 12.6; Platelets 201; Potassium 4.5; Sodium 129   Lipid Panel No results found for: CHOL, TRIG, HDL, CHOLHDL, VLDL, LDLCALC, LDLDIRECT  Additional studies/ records that were reviewed today include:   Echocardiogram: 05/2017 Study Conclusions  - Technical notes: Diffuse hypokinesis patient in rapid afib and   PVC;s during study - Left ventricle: The cavity size was moderately dilated. Wall   thickness was normal. Systolic function was moderately to   severely reduced. The estimated ejection fraction was in the   range of 30% to 35%. The study is not technically sufficient to   allow evaluation of LV diastolic function. - Mitral valve: There was mild regurgitation. - Atrial septum: No defect or patent foramen ovale was identified.  Cardiac Catheterization: 05/2017  LV end diastolic pressure is normal.   1. Normal coronary arteries 2. Normal LVEDP  Plan: will observe overnight in hospital since patient lives alone without support. DC in am. Should be able to proceed with wrist surgery.  Assessment:    No diagnosis found.   Plan:   In order of problems listed above:  1. ***    Medication Adjustments/Labs and Tests Ordered: Current  medicines are reviewed at length with the patient today.  Concerns regarding medicines are outlined above.  Medication changes, Labs and Tests ordered today are listed in the Patient Instructions below. There are no Patient Instructions on file for this visit.   Signed, Ellsworth Lennox, PA-C  09/20/2017 10:18 AM    [facility] [address] [place], [place] [address] Phone: [phone]

## 2017-09-21 ENCOUNTER — Ambulatory Visit: Payer: Medicare Other | Admitting: Student

## 2017-10-05 NOTE — Progress Notes (Signed)
Cardiology Office Note    Date:  10/08/2017   ID:  Juan Barber, DOB 05/01/1946, MRN [id number]  PCP:  Aliene Beams, MD  Cardiologist: Charlton Haws, MD    Chief Complaint  Patient presents with  . Follow-up    3 month visit    History of Present Illness:    Juan Barber is a 72 y.o. male with past medical history of chronic combined systolic and diastolic CHF (EF 78-29% by echo in 05/2017 with cardiac catheterization at that time showing normal cors), nonischemic cardiomyopathy, PAF (on Xarelto), HTN, and HLD who presents to the office today for 64-month follow-up.   He was last examined by Joni Reining, DNP in 06/2017 and denied any recent chest pain or dyspnea on exertion at that time. He had been holding Xarelto for 2 weeks for an upcoming surgery despite being told he would only need to hold the medication for 48 hours. He was continued on Lopressor and Losartan at that time with plans for a repeat echocardiogram in 3 months.    In talking with the patient today, he reports overall doing well from a cardiac perspective since his last office visit. He denies any recent chest pain, dyspnea on exertion, orthopnea, PND, or lower extremity edema.  He does not check his blood pressure regularly but it is well controlled at 132/72 during today's visit. He is unsure which medications he takes once daily versus twice daily and thinks he might be taking Xarelto twice daily. He denies any evidence of active bleeding with this but was strongly encouraged to review his medication list upon returning home and adjusting this to once daily only.  He was previously followed by Orthopedics for bilateral upper extremity fractures and plans to have surgery in the upcoming future. He was to follow-up with Dr. Roda Shutters with [facility] but has not yet done so.   Past Medical History:  Diagnosis Date  . Atrial fibrillation (HCC)   . Nonischemic cardiomyopathy (HCC)    a. echo in  05/2017 showing reduced EF of 30-35% - cath showing normal cors  . Seizures (HCC)     Past Surgical History:  Procedure Laterality Date  . HERNIA REPAIR    . LEFT HEART CATH AND CORONARY ANGIOGRAPHY N/A 06/05/2017   Procedure: LEFT HEART CATH AND CORONARY ANGIOGRAPHY;  Surgeon: [place], Peter M, MD;  Location: [facility] INVASIVE CV LAB;  Service: Cardiovascular;  Laterality: N/A;    Current Medications: Outpatient Medications Prior to Visit  Medication Sig Dispense Refill  . losartan (COZAAR) 25 MG tablet Take 1 tablet (25 mg total) by mouth daily. 90 tablet 3  . metoprolol tartrate (LOPRESSOR) 50 MG tablet Take 1 tablet (50 mg total) by mouth 2 (two) times daily. 180 tablet 3  . rivaroxaban (XARELTO) 20 MG TABS tablet Take 1 tablet (20 mg total) by mouth daily with supper. 30 tablet 11   No facility-administered medications prior to visit.      Allergies:   Other and Talwin [pentazocine]   Social History   Socioeconomic History  . Marital status: Divorced    Spouse name: None  . Number of children: None  . Years of education: None  . Highest education level: None  Social Needs  . Financial resource strain: None  . Food insecurity - worry: None  . Food insecurity - inability: None  . Transportation needs - medical: None  . Transportation needs - non-medical: None  Occupational History  . None  Tobacco  Use  . Smoking status: Never Smoker  . Smokeless tobacco: Never Used  Substance and Sexual Activity  . Alcohol use: Yes    Comment: occassional  . Drug use: No  . Sexual activity: None  Other Topics Concern  . None  Social History Narrative  . None     Family History:  The patient's family history includes Heart attack in his mother.   Review of Systems:   Please see the history of present illness.     General:  No chills, fever, night sweats or weight changes. Positive for back pain.  Cardiovascular:  No chest pain, dyspnea on exertion, edema, orthopnea, palpitations,  paroxysmal nocturnal dyspnea. Dermatological: No rash, lesions/masses Respiratory: No cough, dyspnea Urologic: No hematuria, dysuria Abdominal:   No nausea, vomiting, diarrhea, bright red blood per rectum, melena, or hematemesis Neurologic:  No visual changes, wkns, changes in mental status. All other systems reviewed and are otherwise negative except as noted above.   Physical Exam:    VS:  BP 132/72   Pulse 82   Ht 5' 10.5" (1.791 m)   Wt 175 lb (79.4 kg)   SpO2 99%   BMI 24.75 kg/m    General: Well developed, well nourished [place] American male appearing in no acute distress. Head: Normocephalic, atraumatic, sclera non-icteric, no xanthomas, nares are without discharge.  Neck: No carotid bruits. JVD not elevated.  Lungs: Respirations regular and unlabored, without wheezes or rales.  Heart: Regular rate and rhythm. No S3 or S4.  No murmur, no rubs, or gallops appreciated. Abdomen: Soft, non-tender, non-distended with normoactive bowel sounds. No hepatomegaly. No rebound/guarding. No obvious abdominal masses. Msk:  Strength and tone appear normal for age. No joint deformities or effusions. Extremities: No clubbing or cyanosis. No lower extremity edema.  Distal pedal pulses are 2+ bilaterally. Neuro: Alert and oriented X 3. Moves all extremities spontaneously. No focal deficits noted. Psych:  Responds to questions appropriately with a normal affect. Skin: No rashes or lesions noted  Wt Readings from Last 3 Encounters:  10/08/17 175 lb (79.4 kg)  06/21/17 167 lb (75.8 kg)  06/06/17 159 lb 3.2 oz (72.2 kg)     Studies/Labs Reviewed:   EKG:  EKG is not ordered today.  Recent Labs: 05/18/2017: TSH 1.87 06/04/2017: ALT 13; BUN 14; Creatinine, Ser 1.23; Hemoglobin 12.6; Platelets 201; Potassium 4.5; Sodium 129   Lipid Panel No results found for: CHOL, TRIG, HDL, CHOLHDL, VLDL, LDLCALC, LDLDIRECT  Additional studies/ records that were reviewed today include:    Echocardiogram: 05/2017 Study Conclusions  - Technical notes: Diffuse hypokinesis patient in rapid afib and   PVC;s during study - Left ventricle: The cavity size was moderately dilated. Wall   thickness was normal. Systolic function was moderately to   severely reduced. The estimated ejection fraction was in the   range of 30% to 35%. The study is not technically sufficient to   allow evaluation of LV diastolic function. - Mitral valve: There was mild regurgitation. - Atrial septum: No defect or patent foramen ovale was identified.  Cardiac Catheterization: 06/05/2017  LV end diastolic pressure is normal.   1. Normal coronary arteries 2. Normal LVEDP  Plan: will observe overnight in hospital since patient lives alone without support. DC in am. Should be able to proceed with wrist surgery.  Assessment:    1. Chronic combined systolic and diastolic CHF, NYHA class 2 (HCC)   2. Nonischemic cardiomyopathy (HCC)   3. PAF (paroxysmal atrial fibrillation) (HCC)  4. Current use of long term anticoagulation   5. Closed fracture of right wrist, sequela      Plan:   In order of problems listed above:  1. Chronic Combined Systolic and Diastolic CHF/ Nonischemic Cardiomyopathy - the patient has a known reduced EF of 30-35% by echo in 05/2017 with cardiac catheterization at that time showing normal coronary arteries.  - he denies any recent dyspnea, orthopnea, PND, or lower extremity edema. Does not appear volume overloaded by physical examination.  - he remains on Lopressor and Losartan. We discussed switching to Toprol-XL in the setting of his cardiomyopathy but he declined further medication adjustment at this time. Will plan for a repeat echocardiogram as initially discussed to reassess LV function. If EF remains reduced, will plan to switch to Toprol-XL and can also consider transitioning from Losartan to [facility].   2. Paroxysmal Atrial Fibrillation - he denies any recent  palpitations or dyspnea. Currently in NSR by examination today.  - continue Lopressor for rate-control. - he thinks he might be taking Xarelto twice daily (although he has not requested an early refill from his Pharmacy, therefore this is unlikely). I strongly encouraged him to review his medication list upon returning home and adjusting this to once daily only. Denies any evidence of active bleeding. Will continue on Xarelto for anticoagulation.   3. Closed Fracture of Right Wrist - he reports having pain along the site and has never undergone surgery. Followed by Dr. Roda Shutters with [facility] I recommended he reestablish care and if surgery is necessary, he can hold Xarelto for 48 hours prior to the procedure.    Medication Adjustments/Labs and Tests Ordered: Current medicines are reviewed at length with the patient today.  Concerns regarding medicines are outlined above.  Medication changes, Labs and Tests ordered today are listed in the Patient Instructions below. Patient Instructions  Medication Instructions:  Your physician recommends that you continue on your current medications as directed. Please refer to the Current Medication list given to you today.   Labwork: NONE   Testing/Procedures: .Your physician has requested that you have an echocardiogram. Echocardiography is a painless test that uses sound waves to create images of your heart. It provides your doctor with information about the size and shape of your heart and how well your heart's chambers and valves are working. This procedure takes approximately one hour. There are no restrictions for this procedure.  Follow-Up: Your physician recommends that you schedule a follow-up appointment in: 3 Months with Dr. Eden Emms   Any Other Special Instructions Will Be Listed Below (If Applicable).  If you need a refill on your cardiac medications before your next appointment, please call your pharmacy. Thank you for choosing [facility]!     Signed, Ellsworth Lennox, PA-C  10/08/2017 7:37 PM    [facility] [address] [place], [place] [address] Phone: [phone]

## 2017-10-08 ENCOUNTER — Telehealth: Payer: Self-pay | Admitting: Student

## 2017-10-08 ENCOUNTER — Encounter: Payer: Self-pay | Admitting: Student

## 2017-10-08 ENCOUNTER — Ambulatory Visit (INDEPENDENT_AMBULATORY_CARE_PROVIDER_SITE_OTHER): Payer: Medicare Other | Admitting: Student

## 2017-10-08 VITALS — BP 132/72 | HR 82 | Ht 70.5 in | Wt 175.0 lb

## 2017-10-08 DIAGNOSIS — I5042 Chronic combined systolic (congestive) and diastolic (congestive) heart failure: Secondary | ICD-10-CM

## 2017-10-08 DIAGNOSIS — I480 Paroxysmal atrial fibrillation: Secondary | ICD-10-CM | POA: Diagnosis not present

## 2017-10-08 DIAGNOSIS — S62101S Fracture of unspecified carpal bone, right wrist, sequela: Secondary | ICD-10-CM | POA: Diagnosis not present

## 2017-10-08 DIAGNOSIS — Z7901 Long term (current) use of anticoagulants: Secondary | ICD-10-CM

## 2017-10-08 DIAGNOSIS — I428 Other cardiomyopathies: Secondary | ICD-10-CM | POA: Diagnosis not present

## 2017-10-08 MED ORDER — RIVAROXABAN 20 MG PO TABS: 20.0000 mg | ORAL_TABLET | Freq: Every day | ORAL | 3 refills | Status: DC

## 2017-10-08 NOTE — Patient Instructions (Addendum)
Medication Instructions:  Your physician recommends that you continue on your current medications as directed. Please refer to the Current Medication list given to you today.   Labwork: NONE   Testing/Procedures: .Your physician has requested that you have an echocardiogram. Echocardiography is a painless test that uses sound waves to create images of your heart. It provides your doctor with information about the size and shape of your heart and how well your heart's chambers and valves are working. This procedure takes approximately one hour. There are no restrictions for this procedure.     Follow-Up: Your physician recommends that you schedule a follow-up appointment in: 3 Months with Dr. Eden Emms    Any Other Special Instructions Will Be Listed Below (If Applicable).     If you need a refill on your cardiac medications before your next appointment, please call your pharmacy. Thank you for choosing [facility]!

## 2017-10-08 NOTE — Telephone Encounter (Signed)
October 16, 2017 echo [place]

## 2017-10-16 ENCOUNTER — Ambulatory Visit (HOSPITAL_COMMUNITY): Payer: Medicare Other

## 2017-10-19 ENCOUNTER — Ambulatory Visit (HOSPITAL_COMMUNITY)
Admission: RE | Admit: 2017-10-19 | Discharge: 2017-10-19 | Disposition: A | Discharge status: Home / Self Care | Payer: Medicare Other | Source: Ambulatory Visit | Attending: Student | Admitting: Student

## 2017-10-19 DIAGNOSIS — I5042 Chronic combined systolic (congestive) and diastolic (congestive) heart failure: Secondary | ICD-10-CM | POA: Insufficient documentation

## 2017-10-19 DIAGNOSIS — I481 Persistent atrial fibrillation: Secondary | ICD-10-CM | POA: Insufficient documentation

## 2017-10-19 DIAGNOSIS — I420 Dilated cardiomyopathy: Secondary | ICD-10-CM | POA: Diagnosis not present

## 2017-10-19 NOTE — Progress Notes (Signed)
*  PRELIMINARY RESULTS* Echocardiogram 2D Echocardiogram has been performed.  Stacey Drain 10/19/2017, 2:00 PM

## 2017-10-23 ENCOUNTER — Telehealth: Payer: Self-pay | Admitting: *Deleted

## 2017-10-23 NOTE — Telephone Encounter (Signed)
Called patient with test results. No answer. Unable to leave msg.  

## 2017-10-23 NOTE — Telephone Encounter (Signed)
-----   Message from Brittany M Strader, PA-C sent at 10/22/2017  9:00 AM EDT ----- Please let the patient know his ejection fraction has improved but still remains reduced. EF previously 30-35%, improved to 40% by most recent echocardiogram. With EF remaining reduced, would recommend switching from Lopressor 50mg BID to Toprol-XL 100mg daily. Please forward echo results to PCP, Dr. Hagler. Thank you. 

## 2017-10-30 ENCOUNTER — Telehealth: Payer: Self-pay | Admitting: *Deleted

## 2017-10-30 MED ORDER — METOPROLOL SUCCINATE ER 100 MG PO TB24: 100.0000 mg | ORAL_TABLET | Freq: Every day | ORAL | 3 refills | Status: DC

## 2017-10-30 NOTE — Telephone Encounter (Signed)
-----   Message from Ellsworth Lennox, [place] sent at 10/22/2017  9:00 AM EDT ----- Please let the patient know his ejection fraction has improved but still remains reduced. EF previously 30-35%, improved to 40% by most recent echocardiogram. With EF remaining reduced, would recommend switching from Lopressor 50mg  BID to Toprol-XL 100mg  daily. Please forward echo results to PCP, Dr. Tracie Harrier. Thank you.

## 2017-12-17 ENCOUNTER — Inpatient Hospital Stay (HOSPITAL_COMMUNITY): Payer: Medicare Other

## 2017-12-17 ENCOUNTER — Inpatient Hospital Stay (HOSPITAL_COMMUNITY)
Admission: EM | Admit: 2017-12-17 | Discharge: 2017-12-24 | DRG: 871 | Disposition: A | Discharge status: Hospice | Payer: Medicare Other | Attending: Internal Medicine | Admitting: Internal Medicine

## 2017-12-17 ENCOUNTER — Other Ambulatory Visit: Payer: Self-pay

## 2017-12-17 ENCOUNTER — Emergency Department (HOSPITAL_COMMUNITY): Payer: Medicare Other

## 2017-12-17 ENCOUNTER — Encounter (HOSPITAL_COMMUNITY): Payer: Self-pay | Admitting: Emergency Medicine

## 2017-12-17 DIAGNOSIS — S0003XA Contusion of scalp, initial encounter: Secondary | ICD-10-CM | POA: Diagnosis present

## 2017-12-17 DIAGNOSIS — S52541A Smith's fracture of right radius, initial encounter for closed fracture: Secondary | ICD-10-CM | POA: Diagnosis present

## 2017-12-17 DIAGNOSIS — N390 Urinary tract infection, site not specified: Secondary | ICD-10-CM

## 2017-12-17 DIAGNOSIS — N170 Acute kidney failure with tubular necrosis: Secondary | ICD-10-CM | POA: Diagnosis present

## 2017-12-17 DIAGNOSIS — Z7189 Other specified counseling: Secondary | ICD-10-CM | POA: Diagnosis not present

## 2017-12-17 DIAGNOSIS — I4891 Unspecified atrial fibrillation: Secondary | ICD-10-CM | POA: Diagnosis not present

## 2017-12-17 DIAGNOSIS — X58XXXA Exposure to other specified factors, initial encounter: Secondary | ICD-10-CM | POA: Diagnosis present

## 2017-12-17 DIAGNOSIS — I639 Cerebral infarction, unspecified: Secondary | ICD-10-CM | POA: Diagnosis not present

## 2017-12-17 DIAGNOSIS — J690 Pneumonitis due to inhalation of food and vomit: Secondary | ICD-10-CM | POA: Diagnosis present

## 2017-12-17 DIAGNOSIS — Y92039 Unspecified place in apartment as the place of occurrence of the external cause: Secondary | ICD-10-CM

## 2017-12-17 DIAGNOSIS — E232 Diabetes insipidus: Secondary | ICD-10-CM | POA: Diagnosis present

## 2017-12-17 DIAGNOSIS — D696 Thrombocytopenia, unspecified: Secondary | ICD-10-CM | POA: Diagnosis not present

## 2017-12-17 DIAGNOSIS — I5023 Acute on chronic systolic (congestive) heart failure: Secondary | ICD-10-CM | POA: Diagnosis not present

## 2017-12-17 DIAGNOSIS — R401 Stupor: Secondary | ICD-10-CM

## 2017-12-17 DIAGNOSIS — A414 Sepsis due to anaerobes: Secondary | ICD-10-CM

## 2017-12-17 DIAGNOSIS — N183 Chronic kidney disease, stage 3 unspecified: Secondary | ICD-10-CM

## 2017-12-17 DIAGNOSIS — I130 Hypertensive heart and chronic kidney disease with heart failure and stage 1 through stage 4 chronic kidney disease, or unspecified chronic kidney disease: Secondary | ICD-10-CM | POA: Diagnosis present

## 2017-12-17 DIAGNOSIS — R4701 Aphasia: Secondary | ICD-10-CM | POA: Diagnosis present

## 2017-12-17 DIAGNOSIS — R000 Tachycardia, unspecified: Secondary | ICD-10-CM | POA: Diagnosis not present

## 2017-12-17 DIAGNOSIS — E861 Hypovolemia: Secondary | ICD-10-CM | POA: Diagnosis present

## 2017-12-17 DIAGNOSIS — R748 Abnormal levels of other serum enzymes: Secondary | ICD-10-CM | POA: Diagnosis not present

## 2017-12-17 DIAGNOSIS — I481 Persistent atrial fibrillation: Secondary | ICD-10-CM | POA: Diagnosis present

## 2017-12-17 DIAGNOSIS — I63422 Cerebral infarction due to embolism of left anterior cerebral artery: Secondary | ICD-10-CM | POA: Diagnosis present

## 2017-12-17 DIAGNOSIS — R29727 NIHSS score 27: Secondary | ICD-10-CM | POA: Diagnosis not present

## 2017-12-17 DIAGNOSIS — Z515 Encounter for palliative care: Secondary | ICD-10-CM | POA: Diagnosis present

## 2017-12-17 DIAGNOSIS — I63412 Cerebral infarction due to embolism of left middle cerebral artery: Secondary | ICD-10-CM | POA: Diagnosis present

## 2017-12-17 DIAGNOSIS — R4182 Altered mental status, unspecified: Secondary | ICD-10-CM

## 2017-12-17 DIAGNOSIS — Z4659 Encounter for fitting and adjustment of other gastrointestinal appliance and device: Secondary | ICD-10-CM

## 2017-12-17 DIAGNOSIS — Z1612 Extended spectrum beta lactamase (ESBL) resistance: Secondary | ICD-10-CM | POA: Diagnosis present

## 2017-12-17 DIAGNOSIS — N179 Acute kidney failure, unspecified: Secondary | ICD-10-CM

## 2017-12-17 DIAGNOSIS — A419 Sepsis, unspecified organism: Secondary | ICD-10-CM | POA: Diagnosis present

## 2017-12-17 DIAGNOSIS — R234 Changes in skin texture: Secondary | ICD-10-CM | POA: Diagnosis not present

## 2017-12-17 DIAGNOSIS — Z66 Do not resuscitate: Secondary | ICD-10-CM | POA: Diagnosis not present

## 2017-12-17 DIAGNOSIS — F4310 Post-traumatic stress disorder, unspecified: Secondary | ICD-10-CM | POA: Diagnosis present

## 2017-12-17 DIAGNOSIS — R6520 Severe sepsis without septic shock: Secondary | ICD-10-CM | POA: Diagnosis present

## 2017-12-17 DIAGNOSIS — G8191 Hemiplegia, unspecified affecting right dominant side: Secondary | ICD-10-CM | POA: Diagnosis not present

## 2017-12-17 DIAGNOSIS — E871 Hypo-osmolality and hyponatremia: Secondary | ICD-10-CM | POA: Diagnosis not present

## 2017-12-17 DIAGNOSIS — I5043 Acute on chronic combined systolic (congestive) and diastolic (congestive) heart failure: Secondary | ICD-10-CM | POA: Diagnosis present

## 2017-12-17 DIAGNOSIS — T796XXA Traumatic ischemia of muscle, initial encounter: Secondary | ICD-10-CM | POA: Diagnosis present

## 2017-12-17 DIAGNOSIS — Z8249 Family history of ischemic heart disease and other diseases of the circulatory system: Secondary | ICD-10-CM

## 2017-12-17 DIAGNOSIS — R319 Hematuria, unspecified: Secondary | ICD-10-CM

## 2017-12-17 DIAGNOSIS — D6959 Other secondary thrombocytopenia: Secondary | ICD-10-CM | POA: Diagnosis present

## 2017-12-17 DIAGNOSIS — I428 Other cardiomyopathies: Secondary | ICD-10-CM | POA: Diagnosis not present

## 2017-12-17 DIAGNOSIS — S1093XA Contusion of unspecified part of neck, initial encounter: Secondary | ICD-10-CM | POA: Diagnosis present

## 2017-12-17 DIAGNOSIS — I340 Nonrheumatic mitral (valve) insufficiency: Secondary | ICD-10-CM | POA: Diagnosis not present

## 2017-12-17 DIAGNOSIS — F22 Delusional disorders: Secondary | ICD-10-CM | POA: Diagnosis present

## 2017-12-17 DIAGNOSIS — R29735 NIHSS score 35: Secondary | ICD-10-CM | POA: Diagnosis not present

## 2017-12-17 DIAGNOSIS — G9341 Metabolic encephalopathy: Secondary | ICD-10-CM | POA: Diagnosis present

## 2017-12-17 DIAGNOSIS — I480 Paroxysmal atrial fibrillation: Secondary | ICD-10-CM | POA: Diagnosis not present

## 2017-12-17 DIAGNOSIS — R945 Abnormal results of liver function studies: Secondary | ICD-10-CM | POA: Diagnosis present

## 2017-12-17 DIAGNOSIS — R778 Other specified abnormalities of plasma proteins: Secondary | ICD-10-CM | POA: Diagnosis present

## 2017-12-17 DIAGNOSIS — Z888 Allergy status to other drugs, medicaments and biological substances status: Secondary | ICD-10-CM

## 2017-12-17 DIAGNOSIS — S80211A Abrasion, right knee, initial encounter: Secondary | ICD-10-CM | POA: Diagnosis present

## 2017-12-17 DIAGNOSIS — S80212A Abrasion, left knee, initial encounter: Secondary | ICD-10-CM | POA: Diagnosis present

## 2017-12-17 DIAGNOSIS — R063 Periodic breathing: Secondary | ICD-10-CM | POA: Diagnosis present

## 2017-12-17 DIAGNOSIS — S52549A Smith's fracture of unspecified radius, initial encounter for closed fracture: Secondary | ICD-10-CM | POA: Diagnosis present

## 2017-12-17 DIAGNOSIS — E785 Hyperlipidemia, unspecified: Secondary | ICD-10-CM | POA: Diagnosis present

## 2017-12-17 DIAGNOSIS — G40909 Epilepsy, unspecified, not intractable, without status epilepticus: Secondary | ICD-10-CM | POA: Diagnosis present

## 2017-12-17 DIAGNOSIS — R509 Fever, unspecified: Secondary | ICD-10-CM

## 2017-12-17 DIAGNOSIS — Z79899 Other long term (current) drug therapy: Secondary | ICD-10-CM

## 2017-12-17 DIAGNOSIS — I5022 Chronic systolic (congestive) heart failure: Secondary | ICD-10-CM | POA: Diagnosis not present

## 2017-12-17 DIAGNOSIS — T796XXD Traumatic ischemia of muscle, subsequent encounter: Secondary | ICD-10-CM | POA: Diagnosis not present

## 2017-12-17 DIAGNOSIS — E1122 Type 2 diabetes mellitus with diabetic chronic kidney disease: Secondary | ICD-10-CM | POA: Diagnosis present

## 2017-12-17 DIAGNOSIS — I4819 Other persistent atrial fibrillation: Secondary | ICD-10-CM | POA: Diagnosis present

## 2017-12-17 DIAGNOSIS — I482 Chronic atrial fibrillation: Secondary | ICD-10-CM | POA: Diagnosis present

## 2017-12-17 DIAGNOSIS — R29722 NIHSS score 22: Secondary | ICD-10-CM | POA: Diagnosis not present

## 2017-12-17 DIAGNOSIS — Z7901 Long term (current) use of anticoagulants: Secondary | ICD-10-CM

## 2017-12-17 DIAGNOSIS — M6282 Rhabdomyolysis: Secondary | ICD-10-CM | POA: Diagnosis present

## 2017-12-17 DIAGNOSIS — R634 Abnormal weight loss: Secondary | ICD-10-CM | POA: Diagnosis present

## 2017-12-17 DIAGNOSIS — R1310 Dysphagia, unspecified: Secondary | ICD-10-CM | POA: Diagnosis not present

## 2017-12-17 DIAGNOSIS — E860 Dehydration: Secondary | ICD-10-CM | POA: Diagnosis present

## 2017-12-17 DIAGNOSIS — B961 Klebsiella pneumoniae [K. pneumoniae] as the cause of diseases classified elsewhere: Secondary | ICD-10-CM | POA: Diagnosis present

## 2017-12-17 DIAGNOSIS — R296 Repeated falls: Secondary | ICD-10-CM | POA: Diagnosis present

## 2017-12-17 DIAGNOSIS — R29733 NIHSS score 33: Secondary | ICD-10-CM | POA: Diagnosis not present

## 2017-12-17 DIAGNOSIS — I420 Dilated cardiomyopathy: Secondary | ICD-10-CM | POA: Diagnosis present

## 2017-12-17 DIAGNOSIS — G9340 Encephalopathy, unspecified: Secondary | ICD-10-CM | POA: Diagnosis not present

## 2017-12-17 DIAGNOSIS — A4159 Other Gram-negative sepsis: Secondary | ICD-10-CM

## 2017-12-17 DIAGNOSIS — Z9114 Patient's other noncompliance with medication regimen: Secondary | ICD-10-CM

## 2017-12-17 DIAGNOSIS — R7989 Other specified abnormal findings of blood chemistry: Secondary | ICD-10-CM

## 2017-12-17 LAB — CBC
HEMATOCRIT: 41.4 % (ref 39.0–52.0)
Hemoglobin: 13.8 g/dL (ref 13.0–17.0)
MCH: 32.5 pg (ref 26.0–34.0)
MCHC: 33.3 g/dL (ref 30.0–36.0)
MCV: 97.4 fL (ref 78.0–100.0)
PLATELETS: 60 10*3/uL — AB (ref 150–400)
RBC: 4.25 MIL/uL (ref 4.22–5.81)
RDW: 13.1 % (ref 11.5–15.5)
WBC: 6.1 10*3/uL (ref 4.0–10.5)

## 2017-12-17 LAB — MAGNESIUM: MAGNESIUM: 2.8 mg/dL — AB (ref 1.7–2.4)

## 2017-12-17 LAB — COMPREHENSIVE METABOLIC PANEL
ALBUMIN: 3 g/dL — AB (ref 3.5–5.0)
ALT: 30 U/L (ref 17–63)
AST: 74 U/L — AB (ref 15–41)
Alkaline Phosphatase: 45 U/L (ref 38–126)
Anion gap: 15 (ref 5–15)
BILIRUBIN TOTAL: 3.5 mg/dL — AB (ref 0.3–1.2)
BUN: 105 mg/dL — AB (ref 6–20)
CHLORIDE: 105 mmol/L (ref 101–111)
CO2: 22 mmol/L (ref 22–32)
CREATININE: 2.44 mg/dL — AB (ref 0.61–1.24)
Calcium: 9.1 mg/dL (ref 8.9–10.3)
GFR calc Af Amer: 29 mL/min — ABNORMAL LOW (ref >60)
GFR, EST NON AFRICAN AMERICAN: 25 mL/min — AB (ref >60)
GLUCOSE: 172 mg/dL — AB (ref 65–99)
Potassium: 4.7 mmol/L (ref 3.5–5.1)
Sodium: 142 mmol/L (ref 135–145)
TOTAL PROTEIN: 8.4 g/dL — AB (ref 6.5–8.1)

## 2017-12-17 LAB — URINALYSIS, ROUTINE W REFLEX MICROSCOPIC
Bilirubin Urine: NEGATIVE
Glucose, UA: NEGATIVE mg/dL
Ketones, ur: 5 mg/dL — AB
Nitrite: NEGATIVE
PH: 5 (ref 5.0–8.0)
Protein, ur: 30 mg/dL — AB
RBC / HPF: >50 RBC/hpf — ABNORMAL HIGH (ref 0–5)
SPECIFIC GRAVITY, URINE: 1.015 (ref 1.005–1.030)
WBC, UA: >50 WBC/hpf — ABNORMAL HIGH (ref 0–5)

## 2017-12-17 LAB — HEPARIN LEVEL (UNFRACTIONATED)

## 2017-12-17 LAB — TROPONIN I
TROPONIN I: 0.9 ng/mL — AB (ref <0.03)
Troponin I: 0.88 ng/mL (ref <0.03)

## 2017-12-17 LAB — TSH: TSH: 1.211 u[IU]/mL (ref 0.350–4.500)

## 2017-12-17 LAB — LACTIC ACID, PLASMA
LACTIC ACID, VENOUS: 3.7 mmol/L — AB (ref 0.5–1.9)
Lactic Acid, Venous: 6.1 mmol/L (ref 0.5–1.9)

## 2017-12-17 LAB — HEMOGLOBIN A1C
Hgb A1c MFr Bld: 5.8 % — ABNORMAL HIGH (ref 4.8–5.6)
MEAN PLASMA GLUCOSE: 119.76 mg/dL

## 2017-12-17 LAB — PROCALCITONIN: Procalcitonin: 46.76 ng/mL

## 2017-12-17 LAB — APTT: APTT: 29 s (ref 24–36)

## 2017-12-17 LAB — CBG MONITORING, ED: GLUCOSE-CAPILLARY: 161 mg/dL — AB (ref 65–99)

## 2017-12-17 LAB — CK: CK TOTAL: 898 U/L — AB (ref 49–397)

## 2017-12-17 LAB — GLUCOSE, CAPILLARY: GLUCOSE-CAPILLARY: 133 mg/dL — AB (ref 65–99)

## 2017-12-17 MED ORDER — METOPROLOL TARTRATE 5 MG/5ML IV SOLN: 5.0000 mg | Freq: Once | INTRAVENOUS | Status: DC

## 2017-12-17 MED ORDER — ORAL CARE MOUTH RINSE
15.0000 mL | Freq: Two times a day (BID) | OROMUCOSAL | Status: DC
  Administered 2017-12-17 – 2017-12-24 (×14): 15 mL via OROMUCOSAL

## 2017-12-17 MED ORDER — SODIUM CHLORIDE 0.9% FLUSH
3.0000 mL | Freq: Two times a day (BID) | INTRAVENOUS | Status: DC
  Administered 2017-12-17 – 2017-12-23 (×11): 3 mL via INTRAVENOUS

## 2017-12-17 MED ORDER — ONDANSETRON HCL 4 MG/2ML IJ SOLN: 4.0000 mg | Freq: Four times a day (QID) | INTRAMUSCULAR | Status: DC | PRN

## 2017-12-17 MED ORDER — ONDANSETRON HCL 4 MG PO TABS: 4.0000 mg | ORAL_TABLET | Freq: Four times a day (QID) | ORAL | Status: DC | PRN

## 2017-12-17 MED ORDER — DILTIAZEM HCL-DEXTROSE 100-5 MG/100ML-% IV SOLN (PREMIX)
5.0000 mg/h | INTRAVENOUS | Status: DC
  Administered 2017-12-17 (×2): 5 mg/h via INTRAVENOUS
  Filled 2017-12-17 (×2): qty 100

## 2017-12-17 MED ORDER — LEVALBUTEROL HCL 0.63 MG/3ML IN NEBU: 0.6300 mg | INHALATION_SOLUTION | RESPIRATORY_TRACT | Status: DC | PRN

## 2017-12-17 MED ORDER — HEPARIN (PORCINE) IN NACL 100-0.45 UNIT/ML-% IJ SOLN
1250.0000 [IU]/h | INTRAMUSCULAR | Status: DC
  Administered 2017-12-17: 1250 [IU]/h via INTRAVENOUS
  Filled 2017-12-17: qty 250

## 2017-12-17 MED ORDER — VANCOMYCIN HCL IN DEXTROSE 1-5 GM/200ML-% IV SOLN: 1000.0000 mg | Freq: Once | INTRAVENOUS | Status: DC

## 2017-12-17 MED ORDER — SODIUM CHLORIDE 0.9 % IV SOLN
1500.0000 mg | Freq: Once | INTRAVENOUS | Status: DC
  Administered 2017-12-17: 1500 mg via INTRAVENOUS
  Filled 2017-12-17: qty 1500

## 2017-12-17 MED ORDER — SODIUM CHLORIDE 0.9 % IV BOLUS
1000.0000 mL | Freq: Once | INTRAVENOUS | Status: AC
  Administered 2017-12-17: 1000 mL via INTRAVENOUS

## 2017-12-17 MED ORDER — LEVALBUTEROL HCL 0.63 MG/3ML IN NEBU
0.6300 mg | INHALATION_SOLUTION | Freq: Four times a day (QID) | RESPIRATORY_TRACT | Status: DC
  Administered 2017-12-18 – 2017-12-19 (×8): 0.63 mg via RESPIRATORY_TRACT
  Filled 2017-12-17 (×8): qty 3

## 2017-12-17 MED ORDER — LEVETIRACETAM IN NACL 500 MG/100ML IV SOLN
500.0000 mg | Freq: Once | INTRAVENOUS | Status: AC
  Administered 2017-12-17: 500 mg via INTRAVENOUS
  Filled 2017-12-17: qty 100

## 2017-12-17 MED ORDER — LEVALBUTEROL HCL 0.63 MG/3ML IN NEBU
0.6300 mg | INHALATION_SOLUTION | Freq: Four times a day (QID) | RESPIRATORY_TRACT | Status: DC | PRN
  Administered 2017-12-17: 0.63 mg via RESPIRATORY_TRACT
  Filled 2017-12-17: qty 3

## 2017-12-17 MED ORDER — LORAZEPAM 2 MG/ML IJ SOLN
0.5000 mg | Freq: Once | INTRAMUSCULAR | Status: AC
  Administered 2017-12-17: 0.5 mg via INTRAVENOUS
  Filled 2017-12-17: qty 1

## 2017-12-17 MED ORDER — SODIUM CHLORIDE 0.9 % IV SOLN
500.0000 mg | INTRAVENOUS | Status: DC
  Administered 2017-12-17: 500 mg via INTRAVENOUS
  Filled 2017-12-17 (×3): qty 500

## 2017-12-17 MED ORDER — ACETAMINOPHEN 650 MG RE SUPP: 650.0000 mg | Freq: Four times a day (QID) | RECTAL | Status: DC | PRN

## 2017-12-17 MED ORDER — IPRATROPIUM-ALBUTEROL 0.5-2.5 (3) MG/3ML IN SOLN
3.0000 mL | Freq: Once | RESPIRATORY_TRACT | Status: AC
Start: 2017-12-17 — End: 2017-12-17
  Administered 2017-12-17: 3 mL via RESPIRATORY_TRACT
  Filled 2017-12-17: qty 3

## 2017-12-17 MED ORDER — SODIUM CHLORIDE 0.9 % IV SOLN: 2.0000 g | Freq: Once | INTRAVENOUS | Status: DC

## 2017-12-17 MED ORDER — SODIUM CHLORIDE 0.9 % IV SOLN
2.0000 g | Freq: Once | INTRAVENOUS | Status: AC
  Administered 2017-12-17: 2 g via INTRAVENOUS
  Filled 2017-12-17: qty 2

## 2017-12-17 MED ORDER — METOPROLOL TARTRATE 5 MG/5ML IV SOLN
5.0000 mg | Freq: Once | INTRAVENOUS | Status: AC
  Administered 2017-12-17: 5 mg via INTRAVENOUS
  Filled 2017-12-17: qty 5

## 2017-12-17 MED ORDER — DILTIAZEM LOAD VIA INFUSION
10.0000 mg | Freq: Once | INTRAVENOUS | Status: AC
  Administered 2017-12-17: 10 mg via INTRAVENOUS
  Filled 2017-12-17: qty 10

## 2017-12-17 MED ORDER — BISACODYL 10 MG RE SUPP: 10.0000 mg | Freq: Every day | RECTAL | Status: DC | PRN

## 2017-12-17 MED ORDER — ACETAMINOPHEN 325 MG PO TABS
650.0000 mg | ORAL_TABLET | Freq: Four times a day (QID) | ORAL | Status: DC | PRN
  Administered 2017-12-22 – 2017-12-23 (×3): 650 mg via ORAL
  Filled 2017-12-17 (×3): qty 2

## 2017-12-17 MED ORDER — ACETAMINOPHEN 650 MG RE SUPP
650.0000 mg | Freq: Once | RECTAL | Status: AC
  Administered 2017-12-17: 650 mg via RECTAL
  Filled 2017-12-17: qty 1

## 2017-12-17 MED ORDER — PIPERACILLIN-TAZOBACTAM 3.375 G IVPB 30 MIN: 3.3750 g | Freq: Once | INTRAVENOUS | Status: DC

## 2017-12-17 MED ORDER — ADENOSINE 6 MG/2ML IV SOLN
6.0000 mg | Freq: Once | INTRAVENOUS | Status: AC
  Administered 2017-12-17: 6 mg via INTRAVENOUS
  Filled 2017-12-17: qty 2

## 2017-12-17 MED ORDER — SODIUM CHLORIDE 0.9 % IV SOLN: 2.0000 g | Freq: Every day | INTRAVENOUS | Status: DC

## 2017-12-17 MED ORDER — POTASSIUM CHLORIDE IN NACL 20-0.9 MEQ/L-% IV SOLN: INTRAVENOUS | Status: DC

## 2017-12-17 MED ORDER — ADENOSINE 6 MG/2ML IV SOLN
12.0000 mg | Freq: Once | INTRAVENOUS | Status: AC
  Administered 2017-12-17: 12 mg via INTRAVENOUS

## 2017-12-17 MED ORDER — MAGNESIUM SULFATE 2 GM/50ML IV SOLN
2.0000 g | Freq: Once | INTRAVENOUS | Status: AC
  Administered 2017-12-17: 2 g via INTRAVENOUS

## 2017-12-17 MED ORDER — ASPIRIN 300 MG RE SUPP: 300.0000 mg | Freq: Every day | RECTAL | Status: DC

## 2017-12-17 MED ORDER — MAGNESIUM SULFATE 2 GM/50ML IV SOLN
INTRAVENOUS | Status: AC
  Filled 2017-12-17: qty 50

## 2017-12-17 MED ORDER — SODIUM CHLORIDE 0.9 % IV SOLN
2.0000 g | INTRAVENOUS | Status: DC
  Filled 2017-12-17: qty 20

## 2017-12-17 MED ORDER — DILTIAZEM HCL-DEXTROSE 100-5 MG/100ML-% IV SOLN (PREMIX)
5.0000 mg/h | INTRAVENOUS | Status: DC
  Administered 2017-12-17: 10 mg/h via INTRAVENOUS

## 2017-12-17 MED ORDER — SODIUM CHLORIDE 0.9 % IV SOLN
2.0000 g | Freq: Once | INTRAVENOUS | Status: AC
  Administered 2017-12-17: 2 g via INTRAVENOUS
  Filled 2017-12-17: qty 20

## 2017-12-17 MED ORDER — KETOROLAC TROMETHAMINE 15 MG/ML IJ SOLN: 15.0000 mg | Freq: Four times a day (QID) | INTRAMUSCULAR | Status: DC | PRN

## 2017-12-17 MED ORDER — INSULIN ASPART 100 UNIT/ML ~~LOC~~ SOLN
0.0000 [IU] | Freq: Three times a day (TID) | SUBCUTANEOUS | Status: DC
  Administered 2017-12-18: 3 [IU] via SUBCUTANEOUS
  Administered 2017-12-18: 2 [IU] via SUBCUTANEOUS
  Administered 2017-12-19 (×2): 5 [IU] via SUBCUTANEOUS

## 2017-12-17 MED ORDER — ADENOSINE 6 MG/2ML IV SOLN
INTRAVENOUS | Status: AC
  Administered 2017-12-17: 12 mg via INTRAVENOUS
  Filled 2017-12-17: qty 8

## 2017-12-17 MED ORDER — METOPROLOL TARTRATE 25 MG PO TABS: 25.0000 mg | ORAL_TABLET | Freq: Two times a day (BID) | ORAL | Status: DC

## 2017-12-17 NOTE — Progress Notes (Signed)
Rn never heard back from Dr. Guadlupe Spanish paged her again to see if she could change Cardizem order to titratable.

## 2017-12-17 NOTE — Progress Notes (Signed)
Dr. Sharl Ma up to see pt on floor- pt continues to have tachypnea and rhonchous breath sounds.  New order to titrate pt off of Cardizem drip d/t pt being in ST- as well as UDS order. Dr. Sharl Ma ordered an MRI for the am- but does not want to do anything else d/t hypotension in the ED. RT aware as well of respiratory status. Will continue to monitor pt

## 2017-12-17 NOTE — ED Notes (Signed)
Pt is now only responsive to painful stimuli.  Is no longer verbal.  Snoring respirations.  Dr. Adriana Simas called to bedside to assess.  No new orders received.

## 2017-12-17 NOTE — ED Notes (Signed)
Myself, Dr. Adriana Simas, and Dyanne Iha RN administered Adenocard 6mg  followed by 2 doses of 12mg .  Pt briefly converted to sinus rhythm with all administrations, but returned to rapid rate.  At times looked like SVT and others Afib.  Rate is regular at all times.  See strips for details.  Pt tolerated this well.

## 2017-12-17 NOTE — Progress Notes (Addendum)
Called by RN to see patient for worsening respiratory status with tachypnea and rhonchorous breath sounds.  Patient was earlier admitted with sepsis due to UTI, altered mental status, he was found unresponsive on the floor in his house.  Patient has been unresponsive since he was brought to the ED.  Patient is currently on IV Cardizem 10 mg/h, he is in normal sinus rhythm.  He does have history of A. fib.  Also getting IV heparin.  Patient also received 1 dose of Keppra 500 mg IV in the ED  Reviewed labs and imaging studies.  CT of the cervical spine shows mild prevertebral soft tissue thickening and subcutaneous stranding/fluid in the left neck.  Ligamentous and/or soft tissue injury is not excluded.  MRI cervical spine was recommended.  On exam Chest-bilateral rhonchi  Plan Will discontinue IV Cardizem as patient is in normal sinus rhythm.  He was hypotensive in the ED. Obtain urine drug screen We will order MRI cervical spine in a.m. Patient does have bilateral rhonchi, is currently on 2 L of oxygen by nasal cannula and O2 sats of 97% Likely fluid overload from aggressive IV fluid resuscitation in the ED Will avoid Lasix due to worsening renal function, IV fluids have been stopped We will monitor patient respiratory status  Critical care time spent 35 minutes

## 2017-12-17 NOTE — ED Notes (Signed)
Pt continues to shake and babble incoherently.  The babbling has worsened since Ativan administration.

## 2017-12-17 NOTE — Progress Notes (Signed)
Dr. Willette Pa paged and made aware pts HR sustaining in 140's- Bp 126/81. Pt new admit from ED. Not responding to questions. Waiting for orders/call back.

## 2017-12-17 NOTE — Progress Notes (Signed)
Pharmacy Note:  Initial antibiotic(s) regimen of Vancomycin and Cefepime  ordered by EDP to treat Sepsis.  Estimated Creatinine Clearance: 30.7 mL/min (A) (by C-G formula based on SCr of 2.44 mg/dL (H)).   Allergies  Allergen Reactions  . Other     Pt reports "older" medication used for migraines  . Talwin [Pentazocine]     Sweating, rapid heartbeat   Vitals:   12/17/17 1515 12/17/17 1530  BP:  (!) 78/47  Pulse: (!) 107 91  Resp: (!) 26 (!) 25  Temp: (!) 100.9 F (38.3 C) (!) 101.1 F (38.4 C)  SpO2: 97% 98%   Anti-infectives (From admission, onward)   Start     Dose/Rate Route Frequency Ordered Stop   12/17/17 1415  cefTRIAXone (ROCEPHIN) 2 g in sodium chloride 0.9 % 100 mL IVPB     2 g 200 mL/hr over 30 Minutes Intravenous  Once 12/17/17 1409 12/17/17 1452     Plan: Initial dose(s) of Vancomycin and Cefepime X 1 ordered. F/U admission orders for further dosing if therapy continued.  Mady Gemma, [facility] 12/17/2017 4:09 PM

## 2017-12-17 NOTE — Significant Event (Signed)
Dr Adriana Simas from [facility] ED called to request transfer to [facility].  Chart revwd, case discussed. UTI, A fib , RVR, Rhabdo, AKI, Troponemia, h/o falls, change in mental status.  HR controlled; BP stable; not needing pressor; not on vent. Advised to treat in [facility] ICU; if change in clinical condition,  asked to call back again if needed to transfer.Dr Adriana Simas agreed;  Suggested to f/u with C spine MRI ( see Ct report).

## 2017-12-17 NOTE — ED Notes (Signed)
Pt now babbling at times.  Asking this nurse why she is still here and states "I've never seen you work so late before."  Pt still knows he is in the hospital and gets upset when asked orientation questions stating he has answered them a million times.

## 2017-12-17 NOTE — ED Notes (Signed)
Noted pt's bp decline.  MD aware and another 1L of fluids ordered.  Pt has developed course rhonchi in upper lobes since arrival and MD also aware of this.  Pt continues to be obtunded and responsive only to pain.

## 2017-12-17 NOTE — Progress Notes (Addendum)
Dr. Mahala Menghini paged d/t pt having bad rhonchi in lungs upon auscultation, RR-30 and pt having labored breathing. Possible lasix to be ordered- BUN and creatnine are elevated-but waiting for orders. Will continue to monitor pt

## 2017-12-17 NOTE — ED Notes (Signed)
CRITICAL VALUE ALERT  Critical Value:  Lactic acid 6.1  Date & Time Notied:  12/17/2017, 1830  Provider Notified: Dr. Delman Cheadle Orders Received/Actions taken: see chart

## 2017-12-17 NOTE — ED Notes (Signed)
Pt's daughter also stated pt had not seen a doctor in over 7 years prior to his fall with wrist fractures.  This prompted a cardiac workup which she did not know the results of.  Pt has had a significant decline in the past 6 months or so with about a 30lb weight loss and multiple falls per her.  She is aware of this only because of the family friend who checks on pt and takes him to appointments and such.
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
## 2017-12-17 NOTE — ED Notes (Signed)
PT continues to babble incoherently

## 2017-12-17 NOTE — ED Notes (Signed)
Pt is able to state name, location, and why he is here.  He is unsure of when he fell or exactly what happened, yet remembers watching tv.  Able to follow commands, although sluggish in doing so with ataxia noted more on left than right.
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
## 2017-12-17 NOTE — H&P (Signed)
History and Physical    Juan Barber:[id number] DOB: October 01, 1945 DOA: 12/17/2017  PCP: Aliene Beams, MD  Patient coming from: Home via EMS  I have personally briefly reviewed patient's old medical records in [facility]  Chief Complaint: Found unresponsive in his apartment this morning  HPI: Juan Barber is a 72 y.o. 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  Neighbors had not seen him since Thursday and the police was present at his apartment for another reason and the apartment staff decided to check on the patient.  They found him down on the floor unresponsive covered in urine.  Brought into the emergency department where he was noted to be in atrial fibrillation with rapid ventricular response.  He was given multiple medications including diltiazem and Lopressor to improve his heart rate.  Ultimately was started on a diltiazem drip.  After that his blood pressure dropped when he spiked a temperature to 1.9 in the emergency department.  Blood pressure had been in the 180s systolic and then dropped to the 78/47 range.  Received 4 L of IV fluid and his blood pressure has improved to the mid to low 100s currently 107/62.  The patient was found to have markedly infected urine, and elevated troponin, elevated blood glucoses, and elevated bilirubin, and evaded CPK assistant with acute rhabdomyolysis.  CT scan of the head showed lacunar infarcts of undetermined age, no acute C-spine fracture some soft tissue swelling of the left knee no facial fractures and a left frontal scalp hematoma.  Sepsis protocol was initiated and the patient received appropriate IV fluids, IV antibiotics, and take acid was found to be 6 with a repeat of 3.  Once he received that significant amount of IV fluid his chest became more  congested.  Repeat chest x-ray revealed left lower lobe atelectasis but given concerns regarding his pulmonary health and possible aspiration associated with mental status changes while on the ground antibiotics were started to cover the patient for both a severe urinary tract infection and a pneumonia.  Patient remains minimally responsive and is unable to speak at the present time.  He is having grunting verbalizations as well as congestion in his chest.  He is being admitted to the intensive care unit for severe sepsis.  When the patient came into the emergency department he was originally speaking and then as he deteriorated he is now no longer able to speak.  Review of Systems: Unable as patient unable to respond due to severity of illness  Past Medical History:  Diagnosis Date  . Atrial fibrillation (HCC)   . Nonischemic cardiomyopathy (HCC)    a. echo in 05/2017 showing reduced EF of 30-35% - cath showing normal cors  . Seizures (HCC)     Past Surgical History:  Procedure Laterality Date  . HERNIA REPAIR    . LEFT HEART CATH AND CORONARY ANGIOGRAPHY N/A 06/05/2017   Procedure: LEFT HEART CATH AND CORONARY ANGIOGRAPHY;  Surgeon: [place], Peter M, MD;  Location[facility] INVASIVE CV LAB;  Service: Cardiovascular;  Laterality: N/A;    Social History   Social History Narrative  . Not on file     reports that he has never smoked. He has never used smokeless tobacco. He reports that he drinks alcohol. He reports that he does not use drugs.  Allergies  Allergen Reactions  . Other     Pt reports "older" medication used for migraines  . Talwin [Pentazocine]     Sweating, rapid heartbeat    Family History  Problem Relation Age of Onset  . Heart attack Mother        had AMI in 82s  . AAA (abdominal aortic aneurysm) Neg Hx     Prior to Admission medications   Medication Sig Start Date End Date Taking? Authorizing Provider  rivaroxaban (XARELTO) 20 MG TABS tablet Take 1 tablet (20 mg  total) by mouth daily with supper. 10/08/17  Yes Strader, [place] M, PA-C  losartan (COZAAR) 25 MG tablet Take 1 tablet (25 mg total) by mouth daily. 07/25/17   Wendall Stade, MD  metoprolol succinate (TOPROL-XL) 100 MG 24 hr tablet Take 1 tablet (100 mg total) by mouth daily. 10/30/17   Strader, Lennart Pall, PA-C  metoprolol tartrate (LOPRESSOR) 50 MG tablet Take 1 tablet (50 mg total) by mouth 2 (two) times daily. 05/21/17 08/19/17  Wendall Stade, MD    Physical Exam:  Constitutional: Acutely and critically ill man in severe respiratory discomfort with accessory muscle use and taking respiration Vitals:   12/17/17 1800 12/17/17 1829 12/17/17 1830 12/17/17 1900  BP: 94/61 94/61 105/60 107/62  Pulse: (!) 48 96 95 93  Resp: (!) 25 (!) 24 (!) 24 (!) 22  Temp: 100 F (37.8 C)  99.9 F (37.7 C) 99.9 F (37.7 C)  TempSrc:      SpO2: 99% 99% 99% 100%  Weight:      Height:       Eyes: PERRL, lids and conjunctivae normal ENMT: Mucous membranes are dry. Posterior pharynx clear of any exudate or lesions.poor dentition Neck: normal, supple, no masses, no thyromegaly Respiratory: Bilateral rhonchi without rales or wheezing.  Increased respiratory effort.  SNF against accessory muscle use. Cardiovascular: Him, no murmurs / rubs / gallops. No extremity edema. 2+ pedal pulses. No carotid bruits.  Abdomen: no tenderness, no masses palpated. No hepatosplenomegaly. Bowel sounds positive.  Musculoskeletal: no clubbing / cyanosis. No joint deformity upper and lower extremities. Good ROM, no contractures. Normal muscle tone.  Skin: Denuded skin at the bilateral knees, left superior chest, and left cheek he has a erythematous papular rash across the chest.  Otherwise there are no masses or evidence of decubiti. Neurologic: Not awake not alert unable to respond to commands or answer questions,     Labs on Admission: I have personally reviewed following labs and imaging studies  CBC: Recent Labs  Lab  12/17/17 1105  WBC 6.1  HGB 13.8  HCT 41.4  MCV 97.4  PLT 60*   Basic Metabolic Panel: Recent Labs  Lab 12/17/17 1105 12/17/17 1146  NA 142  --   K 4.7  --   CL 105  --   CO2 22  --   GLUCOSE 172*  --   BUN 105*  --   CREATININE 2.44*  --   CALCIUM 9.1  --   MG  --  2.8*   Liver Function Tests: Recent Labs  Lab 12/17/17 1105  AST 74*  ALT 30  ALKPHOS 45  BILITOT 3.5*  PROT 8.4*  ALBUMIN 3.0*   Cardiac Enzymes: Recent Labs  Lab 12/17/17 1105 12/17/17 1759  CKTOTAL 898*  --   TROPONINI 0.90* 0.88*   CBG: Recent Labs  Lab 12/17/17 1113  GLUCAP 161*   Urine analysis:    Component Value Date/Time   COLORURINE  AMBER (A) 12/17/2017 1105   APPEARANCEUR CLOUDY (A) 12/17/2017 1105   LABSPEC 1.015 12/17/2017 1105   PHURINE 5.0 12/17/2017 1105   GLUCOSEU NEGATIVE 12/17/2017 1105   HGBUR LARGE (A) 12/17/2017 1105   BILIRUBINUR NEGATIVE 12/17/2017 1105   KETONESUR 5 (A) 12/17/2017 1105   PROTEINUR 30 (A) 12/17/2017 1105   NITRITE NEGATIVE 12/17/2017 1105   LEUKOCYTESUR MODERATE (A) 12/17/2017 1105    Radiological Exams on Admission: Ct Head Wo Contrast  Result Date: 12/17/2017 CLINICAL DATA:  Found down in his apartment. Laceration to left cheek. EXAM: CT HEAD WITHOUT CONTRAST CT MAXILLOFACIAL WITHOUT CONTRAST CT CERVICAL SPINE WITHOUT CONTRAST TECHNIQUE: Multidetector CT imaging of the head, cervical spine, and maxillofacial structures were performed using the standard protocol without intravenous contrast. Multiplanar CT image reconstructions of the cervical spine and maxillofacial structures were also generated. COMPARISON:  Cervical spine x-rays dated August 03, 2008. 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 No evidence of hemorrhage, hydrocephalus, extra-axial collection or mass lesion/mass effect. Mild to moderate generalized cerebral atrophy. Mild scattered periventricular and  subcortical white matter hypodensities are nonspecific, but favored to reflect chronic microvascular ischemic changes. Vascular: Atherosclerotic vascular calcification of the carotid siphons. No hyperdense vessel. Skull: Normal. Negative for fracture or focal lesion. Other: Small left frontal scalp hematoma. CT MAXILLOFACIAL FINDINGS Osseous: No fracture or mandibular dislocation. No destructive process. Periapical lucencies involving the right maxillary first and second molars. Orbits: Negative. No traumatic or inflammatory finding. Sinuses: Chronic appearing mild-to-moderate moderate mucosal thickening of the paranasal sinuses, moderate in the left maxillary sinus. The mastoid air cells are clear. Soft tissues: Left cheek laceration. CT CERVICAL SPINE FINDINGS Alignment: Reversal of the normal cervical lordosis, centered at C5, unchanged. No traumatic malalignment. Skull base and vertebrae: No acute cervical spine fracture. Mild anterior superior endplate height loss at T1 with associated Schmorl's node. No primary bone lesion or focal pathologic process. Soft tissues and spinal canal: Mild prevertebral soft tissue thickening. Subcutaneous stranding and fluid in the left neck. No visible canal hematoma. Disc levels: Moderate to severe disc height loss at C2-C3, C5-C6, and C6-C7, progressed when compared to prior study. Mild bilateral neuroforaminal stenosis at C5-C6 and C6-C7. Upper chest: Negative. Other: 2.9 cm superficial subcutaneous cystic lesion in the right posterior neck, likely a sebaceous cyst. IMPRESSION: 1. 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 2. No acute cervical spine fracture. Mild prevertebral soft tissue thickening and subcutaneous stranding/fluid in the left neck. Ligamentous and/or soft tissue injury is not excluded. Recommend MRI cervical spine for further evaluation. 3. Mild anterior superior endplate height loss at T1 with associated Schmorl's node is  favored chronic. 4.  No acute facial fracture. 5. Small left frontal scalp hematoma.  Left cheek laceration. Electronically Signed   By: Obie Dredge M.D.   On: 12/17/2017 12:06   Ct Cervical Spine Wo Contrast  Result Date: 12/17/2017 CLINICAL DATA:  Found down in his apartment. Laceration to left cheek. EXAM: CT HEAD WITHOUT CONTRAST CT MAXILLOFACIAL WITHOUT CONTRAST CT CERVICAL SPINE WITHOUT CONTRAST TECHNIQUE: Multidetector CT imaging of the head, cervical spine, and maxillofacial structures were performed using the standard protocol without intravenous contrast. Multiplanar CT image reconstructions of the cervical spine and maxillofacial structures were also generated. COMPARISON:  Cervical spine x-rays dated August 03, 2008. 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 No evidence of hemorrhage, hydrocephalus, extra-axial collection or mass lesion/mass  effect. Mild to moderate generalized cerebral atrophy. Mild scattered periventricular and subcortical white matter hypodensities are nonspecific, but favored to reflect chronic microvascular ischemic changes. Vascular: Atherosclerotic vascular calcification of the carotid siphons. No hyperdense vessel. Skull: Normal. Negative for fracture or focal lesion. Other: Small left frontal scalp hematoma. CT MAXILLOFACIAL FINDINGS Osseous: No fracture or mandibular dislocation. No destructive process. Periapical lucencies involving the right maxillary first and second molars. Orbits: Negative. No traumatic or inflammatory finding. Sinuses: Chronic appearing mild-to-moderate moderate mucosal thickening of the paranasal sinuses, moderate in the left maxillary sinus. The mastoid air cells are clear. Soft tissues: Left cheek laceration. CT CERVICAL SPINE FINDINGS Alignment: Reversal of the normal cervical lordosis, centered at C5, unchanged. No traumatic malalignment. Skull base and vertebrae: No acute  cervical spine fracture. Mild anterior superior endplate height loss at T1 with associated Schmorl's node. No primary bone lesion or focal pathologic process. Soft tissues and spinal canal: Mild prevertebral soft tissue thickening. Subcutaneous stranding and fluid in the left neck. No visible canal hematoma. Disc levels: Moderate to severe disc height loss at C2-C3, C5-C6, and C6-C7, progressed when compared to prior study. Mild bilateral neuroforaminal stenosis at C5-C6 and C6-C7. Upper chest: Negative. Other: 2.9 cm superficial subcutaneous cystic lesion in the right posterior neck, likely a sebaceous cyst. IMPRESSION: 1. 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 2. No acute cervical spine fracture. Mild prevertebral soft tissue thickening and subcutaneous stranding/fluid in the left neck. Ligamentous and/or soft tissue injury is not excluded. Recommend MRI cervical spine for further evaluation. 3. Mild anterior superior endplate height loss at T1 with associated Schmorl's node is favored chronic. 4.  No acute facial fracture. 5. Small left frontal scalp hematoma.  Left cheek laceration. Electronically Signed   By: Obie Dredge M.D.   On: 12/17/2017 12:06   Dg Chest Port 1 View  Result Date: 12/17/2017 CLINICAL DATA:  Chest pain, altered mental status EXAM: PORTABLE CHEST 1 VIEW COMPARISON:  Chest x-ray of 05/18/2017 FINDINGS: No active infiltrate or effusion is seen. Mediastinal and hilar contours are unremarkable. The heart is borderline enlarged and stable. No bony abnormality is seen. IMPRESSION: No active lung disease.  Heart upper normal in size. Electronically Signed   By: Dwyane Dee M.D.   On: 12/17/2017 11:56   Dg Chest Port 1v Same Day  Result Date: 12/17/2017 CLINICAL DATA:  72 year old male with code sepsis, and evolving lung sounds EXAM: PORTABLE CHEST 1 VIEW COMPARISON:  Chest x-ray obtained earlier today at 11:40 a.m. FINDINGS: Significantly lower  inspiratory volumes with developing lingular and left lower lobe streaky airspace opacities favored to reflect atelectasis. No overt pulmonary edema, pneumothorax or pleural effusion. Stable cardiac and mediastinal contours. No acute osseous abnormality. IMPRESSION: 1. Lower inspiratory volumes with developing streaky lingular and left lower lobe airspace opacities favored to reflect atelectasis. Electronically Signed   By: Malachy Moan M.D.   On: 12/17/2017 18:08   Ct Maxillofacial Wo Cm  Result Date: 12/17/2017 CLINICAL DATA:  Found down in his apartment. Laceration to left cheek. EXAM: CT HEAD WITHOUT CONTRAST CT MAXILLOFACIAL WITHOUT CONTRAST CT CERVICAL SPINE WITHOUT CONTRAST TECHNIQUE: Multidetector CT imaging of the head, cervical spine, and maxillofacial structures were performed using the standard protocol without intravenous contrast. Multiplanar CT image reconstructions of the cervical spine and maxillofacial structures were also generated. COMPARISON:  Cervical spine x-rays dated August 03, 2008. 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 No  evidence of hemorrhage, hydrocephalus, extra-axial collection or mass lesion/mass effect. Mild to moderate generalized cerebral atrophy. Mild scattered periventricular and subcortical white matter hypodensities are nonspecific, but favored to reflect chronic microvascular ischemic changes. Vascular: Atherosclerotic vascular calcification of the carotid siphons. No hyperdense vessel. Skull: Normal. Negative for fracture or focal lesion. Other: Small left frontal scalp hematoma. CT MAXILLOFACIAL FINDINGS Osseous: No fracture or mandibular dislocation. No destructive process. Periapical lucencies involving the right maxillary first and second molars. Orbits: Negative. No traumatic or inflammatory finding. Sinuses: Chronic appearing mild-to-moderate moderate mucosal thickening of the paranasal  sinuses, moderate in the left maxillary sinus. The mastoid air cells are clear. Soft tissues: Left cheek laceration. CT CERVICAL SPINE FINDINGS Alignment: Reversal of the normal cervical lordosis, centered at C5, unchanged. No traumatic malalignment. Skull base and vertebrae: No acute cervical spine fracture. Mild anterior superior endplate height loss at T1 with associated Schmorl's node. No primary bone lesion or focal pathologic process. Soft tissues and spinal canal: Mild prevertebral soft tissue thickening. Subcutaneous stranding and fluid in the left neck. No visible canal hematoma. Disc levels: Moderate to severe disc height loss at C2-C3, C5-C6, and C6-C7, progressed when compared to prior study. Mild bilateral neuroforaminal stenosis at C5-C6 and C6-C7. Upper chest: Negative. Other: 2.9 cm superficial subcutaneous cystic lesion in the right posterior neck, likely a sebaceous cyst. IMPRESSION: 1. 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 2. No acute cervical spine fracture. Mild prevertebral soft tissue thickening and subcutaneous stranding/fluid in the left neck. Ligamentous and/or soft tissue injury is not excluded. Recommend MRI cervical spine for further evaluation. 3. Mild anterior superior endplate height loss at T1 with associated Schmorl's node is favored chronic. 4.  No acute facial fracture. 5. Small left frontal scalp hematoma.  Left cheek laceration. Electronically Signed   By: Obie Dredge M.D.   On: 12/17/2017 12:06    EKG #1: Independently reviewed.  At 10:51 AM showed atrial fibrillation with rapid ventricular response and premature complexes at 164 bpm EKG #2: Dependently reviewed shows atrial fibrillation with an increased QT and rapid ventricular response at 147 bpm that was at 1403 p.m. Both EKGs were compared with May 21, 2017 rate has increased from 123 bpm.  Assessment/Plan Principal Problem:   Sepsis secondary to UTI [facility]) Active  Problems:   Persistent atrial fibrillation (HCC)   Acute renal failure due to rhabdomyolysis (HCC)   Elevated troponin   Traumatic rhabdomyolysis (HCC)   Aspiration pneumonia of both lower lobes due to gastric secretions (HCC)   Chronic systolic CHF (congestive heart failure) (HCC)   Dilated cardiomyopathy (HCC)   Nonischemic cardiomyopathy (HCC)   Thrombocytopenia (HCC)   Closed Smith's fracture of right radius  In summary an extraordinarily complicated 72 year old man found down at home after days of not being seen.  Presents with severe sepsis, UTI, rhabdomyolysis, probable pneumonia, thrombocytopenia, and oliguria all in the setting of chronic congestive heart failure which is systolic in nature patient is acutely and critically ill and has required significant amount of my care this evening amounting to 85 minutes.   1.  Sepsis secondary to UTI with probable pneumonia: Patient down on the ground for prolonged period of time likely since Thursday given his current condition.  He was found covered in urine and is making very little urine at the present time.  He had 20 mL of urine out in a 4-hour.  While in the emergency department.  He has received significant fluid resuscitation and  hopefully he will continue to improve.  He has received pain and ceftriaxone in the emergency department.  Given that he has not had any hospitalizations or recent acute illnesses we will treat him with ceftriaxone to cover his urinary tract infection and pneumonia as well as azithromycin to cover his pneumonia.  I doubt that he has a complicated organism even if this does turn out to be a mild aspiration.  At this point I think these current antibiotics will cover him well.  We will reassess the situation and recheck his chest x-ray in the a.m.  2.  Probable pneumonia: As noted above will start ceftriaxone and azithromycin and recheck chest x-ray in a.m.  3.  Persistent atrial fibrillation: Patient has been  started on heparin full dose anticoagulation.  He may or may not have been taking Eliquis at this point it is really difficult to determine if he has had any in the past 24 hours.  Pharmacy is looking into it.  Platelet count is slightly low however I do not think he has HIT and I think we can safely give him heparin overnight and then determine if he need to continue it in the morning.  If patient felt bleeding then will discontinue heparin.  4.  Acute renal failure due to rhabdomyolysis: Patient's CPK is elevated at 900 range.  His opponent is elevated as well and I believe probably related to the rhabdomyolysis.  Will hydrate patient and rule out myocardial infarction with serial enzymes.  Repeat EKG in a.m.  5.  Elevated troponin rule out for myocardial infarction with serial enzymes and repeat EKG in the a.m.  6.  Traumatic rhabdomyolysis: Continue hydration as noted.  7.  Chronic systolic congestive heart failure: Echocardiogram has been performed.  Previous echocardiogram in March of this year showed an EF of 40%.  She has received a significant amount of IV fluids and may be at risk for pulmonary decompensation and heart failure due to volume.  Will monitor closely.  8.  Related and nonischemic cardiomyopathy: Noted continue treatment.  9.  Thrombocytopenia: Noted will monitor platelet levels while on heparin.  10.  Smith's fracture of right radius: Patient was supposed to have this operated on but apparently had not been able to follow-up.  11.  Oliguria: Monitor urine output closely.  May require further food boluses.  DVT prophylaxis: Anticoagulated on heparin Code Status: Full code Family Communication: Spoke with patient's cousin and his sister who came to the emergency department. Disposition Plan: Home versus skilled facility depending on course likely in 4 to 5 days Consults called: *Cardiology Admission status: Inpatient   Lahoma Crocker MD FACP [facility] Pager [phone]  If 7PM-7AM, please contact night-coverage [email or web address] Password [facility]  12/17/2017, 7:57 PM

## 2017-12-17 NOTE — ED Provider Notes (Addendum)
[facility] EMERGENCY DEPARTMENT Provider Note   CSN: [id number] Arrival date & time: 12/17/17  1039     History   Chief Complaint Chief Complaint  Patient presents with  . Altered Mental Status  . Fall    HPI Juan Barber is a 72 y.o. male.  Level 5 caveat for acuity of condition and urgent need for intervention.  Patient was apparently found face down in his apartment.  He has not been seen since Thursday.  On arrival to the emergency department, he was very confused.  Unable to state a history.  His past medical history was reviewed and as follows:.  Atrial fibrillation, congestive heart failure, dilated/nonischemic cardiomyopathy, seizures.     Past Medical History:  Diagnosis Date  . Atrial fibrillation (HCC)   . Nonischemic cardiomyopathy (HCC)    a. echo in 05/2017 showing reduced EF of 30-35% - cath showing normal cors  . Seizures [facility])     Patient Active Problem List   Diagnosis Date Noted  . Persistent atrial fibrillation (HCC) 06/05/2017  . Chronic systolic CHF (congestive heart failure) (HCC) 06/05/2017  . Dilated cardiomyopathy (HCC) 06/05/2017  . Nonischemic cardiomyopathy (HCC) 06/05/2017  . Closed displaced fracture of shaft of fifth metacarpal bone of left hand 05/31/2017  . Closed Smith's fracture of right radius 05/15/2017    Past Surgical History:  Procedure Laterality Date  . HERNIA REPAIR    . LEFT HEART CATH AND CORONARY ANGIOGRAPHY N/A 06/05/2017   Procedure: LEFT HEART CATH AND CORONARY ANGIOGRAPHY;  Surgeon: [place], Peter M, MD;  Location: [facility] INVASIVE CV LAB;  Service: Cardiovascular;  Laterality: N/A;        Home Medications    Prior to Admission medications   Medication Sig Start Date End Date Taking? Authorizing Provider  rivaroxaban (XARELTO) 20 MG TABS tablet Take 1 tablet (20 mg total) by mouth daily with supper. 10/08/17  Yes Strader, [place] M, PA-C  losartan (COZAAR) 25 MG tablet Take 1 tablet (25 mg total) by mouth daily.  07/25/17   Wendall Stade, MD  metoprolol succinate (TOPROL-XL) 100 MG 24 hr tablet Take 1 tablet (100 mg total) by mouth daily. 10/30/17   Strader, Lennart Pall, PA-C  metoprolol tartrate (LOPRESSOR) 50 MG tablet Take 1 tablet (50 mg total) by mouth 2 (two) times daily. 05/21/17 08/19/17  Wendall Stade, MD    Family History Family History  Problem Relation Age of Onset  . Heart attack Mother        had AMI in 14s  . AAA (abdominal aortic aneurysm) Neg Hx     Social History Social History   Tobacco Use  . Smoking status: Never Smoker  . Smokeless tobacco: Never Used  Substance Use Topics  . Alcohol use: Yes    Comment: occassional  . Drug use: No     Allergies   Other and Talwin [pentazocine]   Review of Systems Review of Systems  Unable to perform ROS: Acuity of condition     Physical Exam Updated Vital Signs BP (!) 106/58   Pulse (!) 102   Temp (!) 100.9 F (38.3 C)   Resp (!) 23   Ht 6\' 2"  (1.88 m)   Wt 79.2 kg (174 lb 8 oz)   SpO2 99%   BMI 22.40 kg/m   Physical Exam  Constitutional:  Garbled speech, dehydrated  HENT:  Head: Normocephalic.  Abrasion/contusion left cheek  Eyes: Conjunctivae are normal.  Neck: Neck supple.  Cardiovascular:  Tachycardia  Pulmonary/Chest: Effort normal and breath sounds normal.  Abdominal: Soft. Bowel sounds are normal.  Musculoskeletal: Normal range of motion.  Neurological:  Unable  Skin:  Dry mucous membranes  Psychiatric:  Confused  Nursing note and vitals reviewed.    ED Treatments / Results  Labs (all labs ordered are listed, but only abnormal results are displayed) Labs Reviewed  COMPREHENSIVE METABOLIC PANEL - Abnormal; Notable for the following components:      Result Value   Glucose, Bld 172 (*)    BUN 105 (*)    Creatinine, Ser 2.44 (*)    Total Protein 8.4 (*)    Albumin 3.0 (*)    AST 74 (*)    Total Bilirubin 3.5 (*)    GFR calc non Af Amer 25 (*)    GFR calc Af Amer 29 (*)    All other  components within normal limits  CBC - Abnormal; Notable for the following components:   Platelets 60 (*)    All other components within normal limits  CK - Abnormal; Notable for the following components:   Total CK 898 (*)    All other components within normal limits  TROPONIN I - Abnormal; Notable for the following components:   Troponin I 0.90 (*)    All other components within normal limits  URINALYSIS, ROUTINE W REFLEX MICROSCOPIC - Abnormal; Notable for the following components:   Color, Urine AMBER (*)    APPearance CLOUDY (*)    Hgb urine dipstick LARGE (*)    Ketones, ur 5 (*)    Protein, ur 30 (*)    Leukocytes, UA MODERATE (*)    RBC / HPF >50 (*)    WBC, UA >50 (*)    Bacteria, UA MANY (*)    All other components within normal limits  MAGNESIUM - Abnormal; Notable for the following components:   Magnesium 2.8 (*)    All other components within normal limits  CBG MONITORING, ED - Abnormal; Notable for the following components:   Glucose-Capillary 161 (*)    All other components within normal limits  URINE CULTURE  LACTIC ACID, PLASMA  LACTIC ACID, PLASMA    EKG EKG Interpretation  Date/Time:  Monday Dec 17 2017 10:51:58 EDT Ventricular Rate:  164 PR Interval:    QRS Duration: 71 QT Interval:  294 QTC Calculation: 486 R Axis:   6 Text Interpretation:  Atrial fibrillation with rapid V-rate Ventricular premature complex Repolarization abnormality, prob rate related Confirmed by Donnetta Hutching ([id number]) on 12/17/2017 12:29:17 PM   Radiology Ct Head Wo Contrast  Result Date: 12/17/2017 CLINICAL DATA:  Found down in his apartment. Laceration to left cheek. EXAM: CT HEAD WITHOUT CONTRAST CT MAXILLOFACIAL WITHOUT CONTRAST CT CERVICAL SPINE WITHOUT CONTRAST TECHNIQUE: Multidetector CT imaging of the head, cervical spine, and maxillofacial structures were performed using the standard protocol without intravenous contrast. Multiplanar CT image reconstructions of the cervical  spine and maxillofacial structures were also generated. COMPARISON:  Cervical spine x-rays dated August 03, 2008. 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 No evidence of hemorrhage, hydrocephalus, extra-axial collection or mass lesion/mass effect. Mild to moderate generalized cerebral atrophy. Mild scattered periventricular and subcortical white matter hypodensities are nonspecific, but favored to reflect chronic microvascular ischemic changes. Vascular: Atherosclerotic vascular calcification of the carotid siphons. No hyperdense vessel. Skull: Normal. Negative for fracture or focal lesion. Other: Small left frontal scalp hematoma. CT MAXILLOFACIAL FINDINGS Osseous: No fracture or mandibular dislocation.  No destructive process. Periapical lucencies involving the right maxillary first and second molars. Orbits: Negative. No traumatic or inflammatory finding. Sinuses: Chronic appearing mild-to-moderate moderate mucosal thickening of the paranasal sinuses, moderate in the left maxillary sinus. The mastoid air cells are clear. Soft tissues: Left cheek laceration. CT CERVICAL SPINE FINDINGS Alignment: Reversal of the normal cervical lordosis, centered at C5, unchanged. No traumatic malalignment. Skull base and vertebrae: No acute cervical spine fracture. Mild anterior superior endplate height loss at T1 with associated Schmorl's node. No primary bone lesion or focal pathologic process. Soft tissues and spinal canal: Mild prevertebral soft tissue thickening. Subcutaneous stranding and fluid in the left neck. No visible canal hematoma. Disc levels: Moderate to severe disc height loss at C2-C3, C5-C6, and C6-C7, progressed when compared to prior study. Mild bilateral neuroforaminal stenosis at C5-C6 and C6-C7. Upper chest: Negative. Other: 2.9 cm superficial subcutaneous cystic lesion in the right posterior neck, likely a sebaceous cyst. IMPRESSION: 1.  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 2. No acute cervical spine fracture. Mild prevertebral soft tissue thickening and subcutaneous stranding/fluid in the left neck. Ligamentous and/or soft tissue injury is not excluded. Recommend MRI cervical spine for further evaluation. 3. Mild anterior superior endplate height loss at T1 with associated Schmorl's node is favored chronic. 4.  No acute facial fracture. 5. Small left frontal scalp hematoma.  Left cheek laceration. Electronically Signed   By: Obie Dredge M.D.   On: 12/17/2017 12:06   Ct Cervical Spine Wo Contrast  Result Date: 12/17/2017 CLINICAL DATA:  Found down in his apartment. Laceration to left cheek. EXAM: CT HEAD WITHOUT CONTRAST CT MAXILLOFACIAL WITHOUT CONTRAST CT CERVICAL SPINE WITHOUT CONTRAST TECHNIQUE: Multidetector CT imaging of the head, cervical spine, and maxillofacial structures were performed using the standard protocol without intravenous contrast. Multiplanar CT image reconstructions of the cervical spine and maxillofacial structures were also generated. COMPARISON:  Cervical spine x-rays dated August 03, 2008. 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 No evidence of hemorrhage, hydrocephalus, extra-axial collection or mass lesion/mass effect. Mild to moderate generalized cerebral atrophy. Mild scattered periventricular and subcortical white matter hypodensities are nonspecific, but favored to reflect chronic microvascular ischemic changes. Vascular: Atherosclerotic vascular calcification of the carotid siphons. No hyperdense vessel. Skull: Normal. Negative for fracture or focal lesion. Other: Small left frontal scalp hematoma. CT MAXILLOFACIAL FINDINGS Osseous: No fracture or mandibular dislocation. No destructive process. Periapical lucencies involving the right maxillary first and second molars. Orbits: Negative. No  traumatic or inflammatory finding. Sinuses: Chronic appearing mild-to-moderate moderate mucosal thickening of the paranasal sinuses, moderate in the left maxillary sinus. The mastoid air cells are clear. Soft tissues: Left cheek laceration. CT CERVICAL SPINE FINDINGS Alignment: Reversal of the normal cervical lordosis, centered at C5, unchanged. No traumatic malalignment. Skull base and vertebrae: No acute cervical spine fracture. Mild anterior superior endplate height loss at T1 with associated Schmorl's node. No primary bone lesion or focal pathologic process. Soft tissues and spinal canal: Mild prevertebral soft tissue thickening. Subcutaneous stranding and fluid in the left neck. No visible canal hematoma. Disc levels: Moderate to severe disc height loss at C2-C3, C5-C6, and C6-C7, progressed when compared to prior study. Mild bilateral neuroforaminal stenosis at C5-C6 and C6-C7. Upper chest: Negative. Other: 2.9 cm superficial subcutaneous cystic lesion in the right posterior neck, likely a sebaceous cyst. IMPRESSION: 1. 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 2. No acute cervical  spine fracture. Mild prevertebral soft tissue thickening and subcutaneous stranding/fluid in the left neck. Ligamentous and/or soft tissue injury is not excluded. Recommend MRI cervical spine for further evaluation. 3. Mild anterior superior endplate height loss at T1 with associated Schmorl's node is favored chronic. 4.  No acute facial fracture. 5. Small left frontal scalp hematoma.  Left cheek laceration. Electronically Signed   By: Obie Dredge M.D.   On: 12/17/2017 12:06   Dg Chest Port 1 View  Result Date: 12/17/2017 CLINICAL DATA:  Chest pain, altered mental status EXAM: PORTABLE CHEST 1 VIEW COMPARISON:  Chest x-ray of 05/18/2017 FINDINGS: No active infiltrate or effusion is seen. Mediastinal and hilar contours are unremarkable. The heart is borderline enlarged and stable. No bony  abnormality is seen. IMPRESSION: No active lung disease.  Heart upper normal in size. Electronically Signed   By: Dwyane Dee M.D.   On: 12/17/2017 11:56   Ct Maxillofacial Wo Cm  Result Date: 12/17/2017 CLINICAL DATA:  Found down in his apartment. Laceration to left cheek. EXAM: CT HEAD WITHOUT CONTRAST CT MAXILLOFACIAL WITHOUT CONTRAST CT CERVICAL SPINE WITHOUT CONTRAST TECHNIQUE: Multidetector CT imaging of the head, cervical spine, and maxillofacial structures were performed using the standard protocol without intravenous contrast. Multiplanar CT image reconstructions of the cervical spine and maxillofacial structures were also generated. COMPARISON:  Cervical spine x-rays dated August 03, 2008. 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 No evidence of hemorrhage, hydrocephalus, extra-axial collection or mass lesion/mass effect. Mild to moderate generalized cerebral atrophy. Mild scattered periventricular and subcortical white matter hypodensities are nonspecific, but favored to reflect chronic microvascular ischemic changes. Vascular: Atherosclerotic vascular calcification of the carotid siphons. No hyperdense vessel. Skull: Normal. Negative for fracture or focal lesion. Other: Small left frontal scalp hematoma. CT MAXILLOFACIAL FINDINGS Osseous: No fracture or mandibular dislocation. No destructive process. Periapical lucencies involving the right maxillary first and second molars. Orbits: Negative. No traumatic or inflammatory finding. Sinuses: Chronic appearing mild-to-moderate moderate mucosal thickening of the paranasal sinuses, moderate in the left maxillary sinus. The mastoid air cells are clear. Soft tissues: Left cheek laceration. CT CERVICAL SPINE FINDINGS Alignment: Reversal of the normal cervical lordosis, centered at C5, unchanged. No traumatic malalignment. Skull base and vertebrae: No acute cervical spine fracture. Mild  anterior superior endplate height loss at T1 with associated Schmorl's node. No primary bone lesion or focal pathologic process. Soft tissues and spinal canal: Mild prevertebral soft tissue thickening. Subcutaneous stranding and fluid in the left neck. No visible canal hematoma. Disc levels: Moderate to severe disc height loss at C2-C3, C5-C6, and C6-C7, progressed when compared to prior study. Mild bilateral neuroforaminal stenosis at C5-C6 and C6-C7. Upper chest: Negative. Other: 2.9 cm superficial subcutaneous cystic lesion in the right posterior neck, likely a sebaceous cyst. IMPRESSION: 1. 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 2. No acute cervical spine fracture. Mild prevertebral soft tissue thickening and subcutaneous stranding/fluid in the left neck. Ligamentous and/or soft tissue injury is not excluded. Recommend MRI cervical spine for further evaluation. 3. Mild anterior superior endplate height loss at T1 with associated Schmorl's node is favored chronic. 4.  No acute facial fracture. 5. Small left frontal scalp hematoma.  Left cheek laceration. Electronically Signed   By: Obie Dredge M.D.   On: 12/17/2017 12:06    Procedures Procedures (including critical care time)  Medications Ordered in ED Medications  diltiazem (CARDIZEM) 1 mg/mL load via infusion 10  mg (10 mg Intravenous Bolus from Bag 12/17/17 1241)    And  diltiazem (CARDIZEM) 100 mg in dextrose 5% (1 mg/mL) infusion (0 mg/hr Intravenous Stopped 12/17/17 1456)  metoprolol tartrate (LOPRESSOR) injection 5 mg (0 mg Intravenous Hold 12/17/17 1500)  vancomycin (VANCOCIN) 1,500 mg in sodium chloride 0.9 % 500 mL IVPB (has no administration in time range)  ceFEPIme (MAXIPIME) 2 g in sodium chloride 0.9 % 100 mL IVPB (2 g Intravenous New Bag/Given 12/17/17 1624)  sodium chloride 0.9 % bolus 1,000 mL (0 mLs Intravenous Stopped 12/17/17 1158)  adenosine (ADENOCARD) 6 MG/2ML injection 6 mg (6 mg  Intravenous Given 12/17/17 1225)  sodium chloride 0.9 % bolus 1,000 mL (0 mLs Intravenous Stopped 12/17/17 1228)  LORazepam (ATIVAN) injection 0.5 mg (0.5 mg Intravenous Given 12/17/17 1205)  adenosine (ADENOCARD) 6 MG/2ML injection 12 mg (12 mg Intravenous Given 12/17/17 1226)  adenosine (ADENOCARD) 6 MG/2ML injection 12 mg (12 mg Intravenous Given 12/17/17 1220)  magnesium sulfate IVPB 2 g 50 mL (0 g Intravenous Stopped 12/17/17 1332)  levETIRAcetam (KEPPRA) IVPB 500 mg/100 mL premix (0 mg Intravenous Stopped 12/17/17 1407)  cefTRIAXone (ROCEPHIN) 2 g in sodium chloride 0.9 % 100 mL IVPB (0 g Intravenous Stopped 12/17/17 1452)  metoprolol tartrate (LOPRESSOR) injection 5 mg (5 mg Intravenous Given 12/17/17 1417)  acetaminophen (TYLENOL) suppository 650 mg (650 mg Rectal Given 12/17/17 1452)  ipratropium-albuterol (DUONEB) 0.5-2.5 (3) MG/3ML nebulizer solution 3 mL (3 mLs Nebulization Given 12/17/17 1622)     Initial Impression / Assessment and Plan / ED Course  I have reviewed the triage vital signs and the nursing notes.  Pertinent labs & imaging results that were available during my care of the patient were reviewed by me and considered in my medical decision making (see chart for details).    Patient presents with confusion, garbled speech.  He has not been seen since Thursday.  He was found on the floor of his apartment.  He is unable to give a history.  Initial vital signs showed normal blood pressure, but he was tachycardic up to 150-160 with a rhythm of SVT versus A. Fib.  He was given fluid boluses and magnesium.  Adenosine 6 mg, 12 mg, 12 mg given.  This brought him back to normal sinus rhythm briefly, but he returned to a SVT versus A. fib pattern.  Cardizem bolus and drip was initiated.  CT head, maxillofacial, cervical spinal reviewed.  An age-indeterminate lacunar infarct in the left thalamus and right cerebellum were noted.  CBC normal, glucose 172, creatinine 2.44 (last creatinine 1.23), total CK 898,  urinalysis shows evidence of infection, troponin 0 0.90,  chest x-ray shows no obvious pneumonia.   IV fluid boluses initiated, Rocephin 2 g IV given, cardiology consultation obtained, patient will be admitted.  1500: Discussed with cardiologist.  Cardiac cath was reviewed which was normal in October 2018.  He recommended IV Lopressor and discontinuation of the diltiazem.  Patient's pulse has responded to Lopressor and is now in the low 100s.  If rate does not improve, he recommended amiodarone bolus and drip.  Will admit to general medicine.   1630: We will start vancomycin and Maxipime.  Systolic blood pressure now 106.  Pulse stable in the 100 range.  Urinary output 300-400 cc.  Patient will respond to painful stimuli.  Airway intact.  Discussed with critical care doctor.  Will admit to general medicine.  Addendeum: Initial presentation was more consistent with a neurological/cardiac event.  Therefore, there  was a delay in identifying code sepsis. CRITICAL CARE Performed by: Donnetta Hutching Total critical care time: 90 minutes Critical care time was exclusive of separately billable procedures and treating other patients. Critical care was necessary to treat or prevent imminent or life-threatening deterioration. Critical care was time spent personally by me on the following activities: development of treatment plan with patient and/or surrogate as well as nursing, discussions with consultants, evaluation of patient's response to treatment, examination of patient, obtaining history from patient or surrogate, ordering and performing treatments and interventions, ordering and review of laboratory studies, ordering and review of radiographic studies, pulse oximetry and re-evaluation of patient's condition.  Final Clinical Impressions(s) / ED Diagnoses   Final diagnoses:  Altered mental status, unspecified altered mental status type  Tachycardia    ED Discharge Orders    None       Donnetta Hutching,  MD 12/17/17 1408    Donnetta Hutching, MD 12/17/17 1528    Donnetta Hutching, MD 12/17/17 1648    Donnetta Hutching, MD 12/17/17 1653    Donnetta Hutching, MD 12/25/17 [phone]

## 2017-12-17 NOTE — ED Notes (Signed)
Pt shaking and mumbling.  Provided with several warm blankets, but considering DTs.  MD aware.

## 2017-12-17 NOTE — Progress Notes (Signed)
Dr. Mahala Menghini called back and stated he would like for the on-call physician here (Dr. Sharl Ma) to come up to floor and look at pt before he orders any Lasix. This RN paged Dr. Sharl Ma- adv him of respiratory status-labored breathing, tachypnea and rhonhous breath sounds. Adv MD of what Dr. Mahala Menghini said.  Waiting for call back-will continue to monitor pt

## 2017-12-17 NOTE — Progress Notes (Addendum)
ANTICOAGULATION CONSULT NOTE - Initial Consult  Pharmacy Consult for Heparin Indication: atrial fibrillation  Allergies  Allergen Reactions  . Other     Pt reports "older" medication used for migraines  . Talwin [Pentazocine]     Sweating, rapid heartbeat   Patient Measurements: Height: 6\' 2"  (188 cm) Weight: 174 lb 8 oz (79.2 kg) IBW/kg (Calculated) : 82.2 HEPARIN DW (KG): 79.2   Vital Signs: Temp: 99.9 F (37.7 C) (05/06 1900) Temp Source: Oral (05/06 1101) BP: 107/62 (05/06 1900) Pulse Rate: 93 (05/06 1900)  Labs: Recent Labs    12/17/17 1105  HGB 13.8  HCT 41.4  PLT 60*  CREATININE 2.44*  CKTOTAL 898*  TROPONINI 0.90*   Estimated Creatinine Clearance: 30.7 mL/min (A) (by C-G formula based on SCr of 2.44 mg/dL (H)).  Medical History: Past Medical History:  Diagnosis Date  . Atrial fibrillation (HCC)   . Nonischemic cardiomyopathy (HCC)    a. echo in 05/2017 showing reduced EF of 30-35% - cath showing normal cors  . Seizures (HCC)    Medications:   (Not in a hospital admission)  Reviewed - Xarelto PTA but history of non-compliance.  Assessment: Okay for Protocol, last of of Xarelto unknown.  Thrombocytopenic w/ PLTC = 60K (MD aware). Baseline aPTT and anti-Xa level WNL, will monitor Heparin with anti-Xa levels.    Goal of Therapy:  Heparin level 0.3-0.7 units/ml Monitor platelets by anticoagulation protocol: Yes   Plan:  No Bolus Start heparin infusion at 1250 units/hr Check anti-Xa in 6-8 hours and daily while on heparin Continue to monitor H&H and platelets Monitor labs, micro and vitals.   Lamonte Richer R 12/17/2017,7:16 PM

## 2017-12-17 NOTE — ED Notes (Signed)
Order for blood cultures placed per sepsis protocol.

## 2017-12-17 NOTE — ED Notes (Signed)
Spoke with pt's daughter who lives in [place].  States there is a close family friend who checks on pt and lives locally who will be coming to the hospital.  Relates pt is very paranoid of doctors and people in general at times and does not trust anything due to being a war veteran.  This is why he has not had any care as he will not allow anyone into his home.

## 2017-12-17 NOTE — Progress Notes (Signed)
Late entry from 2015- RN spoke with Dr. Sheehan who stated that she wanted pt to be hooked up to Cardizem gtt @ 5mg /hr. This RN adv MD that pt is in ST not AFIB at the moment and MD stated he has hx of Afib and not able to take his Eliquis. Adv MD that pts BP 126/82- <MEASUREMEDevelopment worker, intNatural Eyes Laser And Surgery CHarrieLu308North Tampa Behavioral HealthLorin [phone] [phone] Rogelia B1(65Development worker, internaTresa EndShea JPaMarland KitchCentral Coast Endoscopy C91HarriettLu30865tDuncan Regional HosBaylor Scott And White Healthcare - LlanoLorin PickettVMerchant navy [phone] East CampuseO54 [phone] Health Tennova Healthcare - Clarksvill72e7DMarlana Rogelia B4(58Development worker, internaTresa EndShea JGoodlettsviMarland KitchSpectrum Health Zeeland Community80HarriettLu30865tNorthwest Medical Center - BentonWhittier Rehabilitation Hospital BradfordLorin PicketlVMerchant navy [phone] County Community [phone] Endoscopy Center LL11C9DMarlana Rogelia B7(82Development worker, internaTresa EndShea JOsbMarland KitchOchsner Medical Cente84HarriettLu30865tMedical City WeatheDetar Hospital NavarroLorin PicketoVMerchant navy [phone] Health Rehabilitation Hospital Of TallahasseeeO67314-0506-8CitruOceans Behavioral HePappas Rehabilitation Hospital For Chi66ldDMarlana Rogelia B6(231Development worker, internaTresa EndShea JPrattsviMarland KitchSouthern Kentucky Surgicenter LLC Dba Greenview Surge61HarriettLu30865tApple Surgery Columbia Eye And Specialty Surgery Center LtdLorin PickettVMerchant navy [phone] Regional [phone] Lukes HoDigestive Disease Endoscopy Center Inc96 33SuDMarlana Rogelia B5Development worker, internaTresa EndShea JSt. HeMarland KitchParadise Valley(92HarriettLu30865tBaylor Institute For Rehabilitation At FSummitridge Center- Psychiatry & Addictive MedLorin PicketsVMerchant navy [phone] County Hospital [phone] Surgical Center On Peacht4reDMarlana Rogelia B4(40Development worker, internaTresa EndShea JTMarland KitchNorthwest Community Day Surgery Cent(863HarriettLu30865tLafayette Regional Health Nmc Surgery Center LP Dba The Surgery Center Of NacogdochesLorin PickettVMerchant navy [phone] State [phone]-Bon Secours Mary Immaculate Hospita40l9DMarlana Rogelia B148Development worker, internaTresa EndShea JSausalMarland KitchBloomington Eye Inst81HarriettLu30865tSalinas Valley Memorial HosEating Recovery Center Behavioral HealthLorin PickettVMerchant navy [phone] Mental Health Syst([phone]-Hurst Ambulatory Surgery Center LLC Dba Precinct Ambulatory Surgery Cent80erDMarlana Rogelia B7(443Development worker, internaTresa EndShea JZephyrhills WMarland KitchMemorialcare Surgical Center At Saddl(77HarriettLu30865tTristate SurgerSurgery Center Of MichiganLorin PicketCVMerchant navy [phone] [facility] Fort [phone] Eye And Skin Centers 66PcDMarlana Rogelia B661Development worker, internaTresa EndShea JConcMarland KitchLifeways(86HarriettLu30865tEndoscopy Center Of Southeast TexSurgical Hospital At SouthwoodsLorin Picket VMerchant navy [phone] 259Wausau Surgery [phone] Medical Johnson City Medical Cente63r7DMarlana<MEASUREMENTDevelopment worker, intClinica HarrieLu308Red River Behavioral CenterLorin [phone] [phone] Rogelia B165Development worker, internaTresa EndShea JCoMarland KitchLaurel Oaks Behavioral HealHarriettLu30865tErie Va Medical Inspire Specialty HospitalLorin PickettVMerchant navy [phone] S. Harper Geriatric Psychiatry [phone] Orange EndoscopClaiborne County11 HDMarlana<MEASUREM308Ms Band Of Choctaw HospitalLorin Pick40.315 8 Rogelia B687Development worker, internaTresa EndShea JWest DaMarland KitchVa Black Hills Healthcare System - Ho(50HarriettLu30865tNatividad Medical Physicians Medical CenterLorin PickettVMerchant navy [phone] Medical CentereO97 [phone]-Sain FraOakes Community Hospital5380DMarlana Rogelia B30Development worker, internaTresa EndShea JOcoMarland KitchGalloway Surge51HarriettLu30865tMilton S Hershey Medical Poplar Bluff Regional Medical CenterLorin PickettVMerchant navy [phone] Care [phone] Medical Center - East Newton Ca61mpDMarlana Rogelia B491Development worker, internaTresa EndShea JEllisbMarland KitchRehabilitation Hospital Navice32HarriettLu30865tSurgcenter CameSelect Specialty Hospital - Spectrum HealthLorin PicketaVMerchant navy [phone] [phone] HilBaptist Memorial Hospital - Carroll C60ouDMarlana Rogelia B1(262Development worker, internaTresa EndShea JMcIntMarland KitchSequoia Surgical92HarriettLu30865tBolsa Outpatient Surgery Center A Medical CorporNational Jewish HealthLorin PicketiVMerchant navy [phone] Health University [phone] Line EnLoch Raven Va Medical Cente72r7DMarlana Rogelia B874Development worker, internaTresa EndShea JVan HMarland KitchCarolina Healthcare AssocHarriettLu30865tAlta Bates Summit Med Ctr-Summit Campus-HawtAlliance Healthcare SystemLorin PicketrVMerchant navy [phone] Woods Surgery Center([phone] CreThe Medical Center Of Southeast Texas15375 SDMarlana Rogelia BDevelopment worker, internaTresa EndShea JGosMarland KitchEncompass Health Rehabilitation Hospital Of (207HarriettLu30865tSan Antonio Gastroenterology Edoscopy CentBaptist Health Rehabilitation InstituteLorin Picket VMerchant navy [phone] Regional [phone] ForesMemorial Regional Hospita31l3DMarlana Rogelia B4(414Development worker, internaTresa EndShea JAltoMarland KitchNorth Big Horn Hospital84HarriettLu30865tSt. Elizabeth Community HosSt. Anthony'S HospitalLorin PickettVMerchant navy [phone] Specialty Hospital - Battle CreekeO89267-09079-7PineMeNew Mexico Orthopaedic Surgery Center LP Dba [place] Orthopaedic Surgery Cent57erDMarlana Rogelia B6(51Development worker, internaTresa EndShea JHayneviMarland KitchOdessa Endoscopy C(62HarriettLu30865tSurgicenter Of VinelanWashington County Memorial HospitalLorin PicketLVMerchant navy [phone] Surgical Center [phone] RegProvidence St Vincent Medical Cent75erDMarlana Rogelia B5Development worker, internaTresa EndShea JConMarland KitchSummit Ventures Of Santa BHarriettLu30865tSt Lucie Surgical CentSt Joseph'S Hospital SouthLorin Picket VMerchant navy [phone] County [phone] Scott And White Texas SpinTri State Centers For Sight39(5DMarlana Rogelia B591Development worker, internaTresa EndShea JBandMarland Kitch481 Asc PrHarriettLu30865tBeatrice Community HosSpecialists Hospital ShreveportLorin PickettVMerchant navy [phone] Medical [phone] MonrHermann [address] Surgical Hospit44alDMarlana Rogelia B10Development worker, internaTresa EndShea JMilfMarland KitchSouth Nassau Communities Hospital Off Campus Emerg21HarriettLu30865tSt. Elizabeth EdgEncompass Health Nittany Valley Rehabilitation HospitalLorin PicketoVMerchant navy [phone]. Bernards Medical CentereO([phone] HosEncompass Health Rehabilitation Hospital Of Sav48(5DMarlana Rogelia B233Development worker, internaTresa EndShea JArbMarland KitchShoals(47HarriettLu30865tQueens Hospital Glens Falls HospitalLorin PickettVMerchant navy [phone] Georgetown Memorial [phone] Conemaugh MeyerFremont Hospi68t(DMarlana Rogelia B6(239Development worker, internaTresa EndShea JGoldsMarland KitchAgmg Endoscopy Center A General Pa22HarriettLu30865tOrlando SurgicarThe Champion CenterLorin PicketLVMerchant navy [phone] Hospita([phone] Hospital - Las VVa New Mexico Healthcare Syst38e(DMarlana Rogelia B9(332Development worker, internaTresa EndShea JNewMarland KitchLos Angeles Community86HarriettLu30865tWiregrass Medical Ace Endoscopy And Surgery CenterLorin PickettVMerchant navy [phone] Fairbanks Medical [phone]-Wilmington AmbulatoryPam Specialty Hospital Of Texarkana North9 S52. DMarlana Rogelia B2(35Development worker, internaTresa EndShea JOttertMarland KitchMount Desert Island30HarriettLu30865tProvidence HosInspire Specialty HospitalLorin PickettVMerchant navy [phone] Memorial Hospita([phone] Hughston Memorial Hosp75itDMarlana Rogelia B640Development worker, internaTresa EndShea JLocklMarland KitchBeverly Hospital Addison Gilbe76HarriettLu30865tDoor County Medical Kootenai Outpatient SurgeryLorin PickettVMerchant navy [phone] Specialty Hospital - NashvilleeO144315-27133-0KSanEye Surgery Center Of West Georgia Incorporated74724DMarlana Rogelia B51Development worker, internaTresa EndShea JMiller's CMarland KitchConnally Memorial Medic(43HarriettLu30865tScl Health Community Hospital - SoutFreestone Medical CenterLorin PicketeVMerchant navy [phone] Medical CentereO93([phone] State Riverside Regional Medical Cen4teDMarlana Rogelia B630Development worker, internaTresa EndShea JMer RoMarland KitchNantucket Cottage60HarriettLu30865tTanner Medical Center/East AlMayo Clinic Health System S FLorin PicketaVMerchant navy [phone] Kaplan Memorial HospitaleO60([phone] ViSeaside Surgery Cente12r9DMarlana Rogelia B6(360Development worker, internaTresa EndShea JOccidenMarland KitchAmg Specialty Hospita(419HarriettLu30865tCentral Wyoming Outpatient Surgery CenteCenter For Orthopedic Surgery LLCLorin PicketLVMerchant navy [phone] County Community [phone] Physicians Network Inc Dba Carolinas Gastroenterology Pickens County Medical Cent26(6DMarlana Rogelia B462Development worker, internaTresa EndShea JAkhMarland KitchMad River Community(705HarriettLu30865tHuntingdon Valley Surgery Dr John C Corrigan Mental Health CenterLorin PickettVMerchant navy [phone] Medical CentereO8816 3516 6PriDoctors NeuroShelby Baptist Ambulatory Surgery Center 15(5DMarlana Rogelia B494Development worker, internaTresa EndShea JHamilton BraMarland KitchWest Central Georgia Regional62HarriettLu30865tUhs Binghamton General HosLv Surgery Ctr LLCLorin PickettVMerchant navy [phone] Health Rehabilitation Hospital Of [phone] Al CanOzarks Medical Cen4teDMarlana Rogelia B598Development worker, internaTresa EndShea JElk FaMarland KitchUniversity Medic(323HarriettLu30865tMckenzie Memorial HosRiver Valley Ambulatory Surgical CenterLorin PickettVMerchant navy [phone] Island Jewish Medical CentereO283 E810-31048-1REliInnovations Surgery Center 33(6DMarlana Rogelia B3Development worker, internaTresa EndShea JRMarland KitchTennova Healthcare North Knoxville Medic25HarriettLu30865tHunter Holmes Mcguire Va Medical Mary Breckinridge Arh HospitalLorin PickettVMerchant navy [phone] California Stone Cent951-51465-5MCenter For SurCitrus Urology Center In19c9DMarlana Rogelia B168Development worker, internaTresa EndShea JPaMarland KitchParkview Noble(587HarriettLu30865tCommunity Howard Regional HealtLevindale Hebrew Geriatric Center & HospitalLorin PicketIVMerchant navy [phone] Surgicenter [phone] Of South Alabama Children'S Faulkner Hospit68a(DMarlana Rogelia B653Development worker, internaTresa EndShea JWestfMarland KitchKindred Hospital - 76HarriettLu30865tOakes Community HosOsu Internal Medicine LLCLorin PickettVMerchant navy [phone] Center Of Grand Junctio([phone]-Captain James A. Lovell FederaCentral Ma Ambulatory Endoscopy Ce44ntDMarlana Rogelia B3(440Development worker, internaTresa EndShea JLouisbMarland KitchCedar Park Regional Medic31HarriettLu30865tBeacan Behavioral Health BNashville Gastrointestinal Specialists LLC Dba Ngs Mid State Endoscopy CenterLorin PicketkVMerchant navy [phone] Westside HospitaleO7893 [phone] SubRidge Lake As23c DMarlana Rogelia B2Development worker, internaTresa EndShea JMakaha ValMarland KitchTrihealth Surgery Center20HarriettLu30865tDelware Outpatient Center For SuGuam Memorial Hospital AuthorityLorin PicketeVMerchant navy [phone] Medical Ctr [phone] MenThe Neuromedical Center Rehabilitation Hospi14taDMarlana Rogelia B5(46Development worker, internaTresa EndShea JShorewMarland KitchAnmed Enterprises Inc Upstate Endoscopy Cente(78HarriettLu30865tRoseland Community HosMontgomery County Emergency ServiceLorin PickettVMerchant navy [phone] Hospital Of PlanoeO563 So712-186AzusHouston Methodist West Hospital854853 DMarlana Rogelia B6(516Development worker, internaTresa EndShea JSugarcrMarland KitchPcs Endosc57HarriettLu30865tGuidance CenterPort Orange Endoscopy And Surgery CenterLorin PicketTVMerchant navy [phone] Diagnostic Center L847-33831-2South YaGeisinger Encompass Health RehJohn Muir Medical Center-Concord Camp13usDMarlana Rogelia B7(31Development worker, internaTresa EndShea JSouth LebaMarland KitchMemorialHarriettLu30865tBaylor Scott & White Surgical Hospital At ShTaylor Station Surgical Center LtdLorin PicketmVMerchant navy [phone] Ulm Medical Cente([phone] University Of Vermont Health Network Elizabethtown MoseOcala Specialty Surgery Center LLC9454(4DMarlana Rogelia B540Development worker, internaTresa EndShea JBear CrMarland KitchHarmo77HarriettLu30865tSt Joseph'S Hospital - SavFort Myers Surgery CenterLorin PicketnVMerchant navy [phone] Cooper Medical Cente([phone] Regional Medical Ce89n(DMarlana Rogelia B7Development worker, internaTresa EndShea JElMarland KitchJfk Medic70HarriettLu30865tEvergreen Hospital Medical West Florida Medical Center Clinic PaLorin PickettVMerchant navy [phone] [phone] MocaPeacehealth Peace IIntegris Community Hospital - Council Cros77siDMarlana Rogelia B1Development worker, internaTresa EndShea JSeton VillMarland KitchProliance SurgeoHarriettLu30865tLake Lansing Asc PartnerPresence Chicago Hospitals Network Dba Presence Saint Mary Of Nazareth Hospital CenterLorin PicketLVMerchant navy [phone] General [phone] Osborn Fox Memorial Hospital65(6DMarlana Rogelia B564Development worker, internaTresa EndShea JLaveMarland Kitc(661HarriettLu30865tLeahi HosAmbulatory Surgery Center Of WnyLorin PickettVMerchant navy [phone] Microsurgical Center LLC([phone] Flint Surgery LLC9525(DMarlana Rogelia B127Development worker, internaTresa EndShea JHorMarland KitchMohawk Vall61HarriettLu30865tGlendale Adventist Medical Center - Wilson TeBarbourville Arh HospitalLorin PicketaVMerchant navy [phone] Mount Vernon [phone] RegMercy Regional Medical Center82280(DMarlana Rogelia B181Development worker, internaTresa EndShea JMcMarland KitchDiscover Eye Surgery CHarriettLu30865tSutter Solano Medical Spectrum Health United Memorial - United CampusLorin PickettVMerchant navy [phone] Health Ambulatory Surgical Center Inverness Orthopedics And Spine Surgery CentereO8620-35849-0ClePark CentralN W Eye Surgeons P28 CDMarlana Rogelia B531Development worker, internaTresa EndShea JMission WoMarland KitchRiverside Rehabilitation 66HarriettLu30865tBedford Ambulatory Surgical CenteOlin E. Teague Veterans' Medical CenterLorin PicketLVMerchant navy [phone] Health Rehabilitation Of City View([phone] HealGeorgetown Behavioral Health I40nsDMarlana Rogelia B7(43Development worker, internaTresa EndShea JBluewaMarland KitchDigestive Health Center(93HarriettLu30865tSentara Halifax Regional HosFoster G Mcgaw Hospital Loyola University Medical CenterLorin PickettVMerchant navy [phone] Health Center-FarmingtoneO8798 Eas646-4462-1LFlint RiveVanderbilt Wilson County Hos36piDMarlana Rogelia B6Development worker, internaTresa EndShea JPenhMarland KitchSan Antonio Behavioral Healthcare Hosp(617HarriettLu30865tSaint Francis Medical Central Connecticut Endoscopy CenterLorin PickettVMerchant navy [phone] Surgery CentereO7939-47033-8ExcelsioNorth Iowa Medical Center West Campus8286 60N(DMarlana Rogelia B4Development worker, internaTresa EndShea JBrentwMarland KitchBaylor (224HarriettLu30865tNew Jersey Surgery CenteSage Rehabilitation InstituteLorin PicketLVMerchant navy [phone] Digestive Health Endoscopy Cent415 3733 5Tanner MedicHebrew Rehabilitation Center At Ded45haDMarlana Rogelia B4(60Development worker, internaTresa EndShea JAbbottstMarland KitchJeff Davis91HarriettLu30865tGenesis Medical Center-DSouthern Virginia Regional Medical CenterLorin PicketiVMerchant navy [phone] Community [facility], [phone] Healthcare Shady Healthsouth Rehabilitation Hospital Of42 MDMarlana Rogelia B350Development worker, internaTresa EndShea JCanMarland KitchBertrand Chaffee(36HarriettLu30865tEastside AssociateLake Wales Medical CenterLorin PicketLVMerchant navy [phone] J Mccord Adolescent Treatment [phone]ic Rehabilitation I95nsDMarlana Rogelia B228Development worker, internaTresa EndShea JHardinsbMarland KitchRiver View Surge83HarriettLu30865tPickens County Medical Northlake Surgical Center LPLorin PickettVMerchant navy [phone] Asc L678-76088-2HaNorthwest SuHarper County Community Hosp33i(DMarlana Rogelia B5(510Development worker, internaTresa EndShea JSergeant BlMarland KitchNorthwest Spine And Laser Surgery C(64HarriettLu30865tSheperd Hill HosCrichton Rehabilitation CenterLorin PickettVMerchant navy [phone] Hermann Rehabilitation Hospital Ka928-7905West [place] W.G. (Bill) Hefner Salisbury Va Medical Center (Sa23l(DMarlana Rogelia B9(817Development worker, internaTresa EndShea JMorgantMarland KitchSterlington Rehabilitation51HarriettLu30865tPortneuf Medical Encompass Health Rehabilitation Hospital Of HumbleLorin PickettVMerchant navy [phone] Vincents [phone] Memorial Restorative Care Hos64piDMarlana Rogelia B263Development worker, internaTresa EndShea JKMarland KitchChristus Dubuis Hospital Of31HarriettLu30865tKindred Hospital North HoSchoolcraft Memorial HospitalLorin PickettVMerchant navy [phone] University [phone] River Medical C23enDMarlana Rogelia B654Development worker, internaTresa EndShea JMount CrogMarland KitchUniversity Medical Center Of Southe66HarriettLu30865tAscension - All SEye Surgery Center Of Augusta LLCLorin PicketnVMerchant navy [phone] Consultants Of San Antonio Stone [phone] ANortheast RehWarren Memorial Hosp4itDMarlana Rogelia B6(75Development worker, internaTresa EndShea JZavaMarland KitchProvidence52HarriettLu30865tSinging River HosGastrointestinal Specialists Of Clarksville PcLorin PickettVMerchant navy [phone] St Elizabeth'S HospitaleO6 ([phone] Lake Shore RegAdvanced Ambulatory Surgical Center Inc5610(DMarlana Rogelia B570Development worker, internaTresa EndShea JLakeview NoMarland KitchUniversity Of KansasHarriettLu30865tPresentation Medical Surgery Center Of NaplesLorin PickettVMerchant navy [phone] Health Reh At [phone] Medical Center Wild Rose Com Mem Hospital63(3DMarlana Rogelia B576Development worker, internaTresa EndShea JMaMarland KitchNorth Ms Medic56HarriettLu30865tMemorialcare Surgical Center At Saddleback LLC Dba Laguna Niguel Surgery Kaiser Fnd Hosp - RiversideLorin PickettVMerchant navy [phone] Children'S Hospital56451970MineraAndalusia Regional H8osDMarlana Rogelia B821Development worker, internaTresa EndShea JLittle CyprMarland KitchKosciusko Community61HarriettLu30865tHeart Hospital Of LafaGarfield Medical CenterLorin PickettVMerchant navy [phone] Simons By-The-Sea Hospit726-70757-2SoArnold Palmer HRoane Medical Center56 N.61 KDMarlana Rogelia B392Development worker, internaTresa EndShea JRiviera BeMarland KitchLos Robles Hospital & Medical Center - Ea50HarriettLu30865tAcadian Medical Center (A Campus Of Mercy Regional Medical CeNemaha Valley Community HospitalLorin PicketeVMerchant navy [phone] Ambulatory Care [phone] Surgery Center I6n(DMarlana<MEASUREMENTDevelopment worker, intSunbury CommunitHarrieLu308University Orthopaedic CenterLorin [phone] Va Medical Center (Va Central Texas Healthcare [phone]<MEASUREM308Glendive Medical CenterLorin Pick40.601-7 Rogelia B263Development worker, internaTresa EndShea JSylvarMarland KitchPrince William Ambulatory Surge93HarriettLu30865tWhittier Rehabilitation Hospital BraFreeman Regional Health ServicesLorin PicketoVMerchant navy [phone] Health Creighton University Medical - Bergan MercyeO7620-74058-5St. Regional RehClinical Associates Pa Dba Clinical Associates A48scDMarlana Rogelia B384Development worker, internaTresa EndShea JWallenpaupack Lake EstaMarland KitchHill Crest Behavioral Health51HarriettLu30865tNor Lea District HosFairbanksLorin PickettVMerchant navy [phone] Health Medical Center - Hot Spring [phone] MUniontown Hosp62i(DMarlana Rogelia B176Development worker, internaTresa EndShea JColumMarland KitchSurgery Center At [address] Michiga(25HarriettLu30865tPalms Of Pasadena HosCrisp Regional HospitalLorin PickettVMerchant navy [phone] Hospital Of Indianapolis, LLCeO28([phone] DigestivMidwest Orthopedic Specialty Hosp22itDMarlana Rogelia B260Development worker, internaTresa EndShea JTanque VeMarland KitchBirmingham Va Medic22HarriettLu30865tColumbia Abilene Endoscopy CenterLorin PickettVMerchant navy [phone] Endoscopy CentereO7([phone] ViPorter Medical Center36, DMarlana Rogelia B623Development worker, internaTresa EndShea JRaMarland KitchParkland Memorial40HarriettLu30865tPasadena Advanced Surgery InstWheaton Franciscan Wi Heart Spine And OrthoLorin PicketuVMerchant navy [phone] Surgery CentereO602 72158 9The WooBarstoSummit Surgery Cent71erDMarlana Rogelia B542Development worker, internaTresa EndShea JPonderMarland KitchJennersville Regional93HarriettLu30865tRiverside County Regional Medical Center - D/Columbus HospitalLorin PicketAVMerchant navy [phone] Foundation - Extended CareeO519-01549-2GraSt Marys HospitaPioneer Community Hospital612(DMarlana Rogelia B151Development worker, internaTresa EndShea JAckMarland KitchMercy Hospital Of Francisca91HarriettLu30865tPalmer Lutheran Health Chippenham Ambulatory Surgery Center LLCLorin PickettVMerchant navy [phone] Centere([phone] North Mississippi Medical Center West Poi52(2DMarlana Rogelia B643Development worker, internaTresa EndShea JHickMarland KitchCommunity50HarriettLu30865tMaryland Specialty Surgery CenteSt. Jude Medical CenterLorin PicketLVMerchant navy [phone] Secours St Francis Watkins Cent323-24080-9La Lanier Eye Associates LLC Dba Advanced Eye SurgGadsden Regional Medica9l DMarlana Rogelia B640Development worker, internaTresa EndShea JHendMarland KitchApple Surge80HarriettLu30865tChildren'S Hospital Of Orange Consulate Health Care Of PensacolaLorin PicketnVMerchant navy [phone] York Gi Center LL639 32980 6St. Clair MerSt Michaels Surgery Cent19e(DMarlana Salvagey Meigs St.somatheter dose she still want IVF. Adv to hold IVF for now dt good output and rhonchi lung sounds.    2100- RN paged DR. Sheehan to see if Cardizem was titratable. RN tried to increase Cardizem gtt to 10mg  after 30 min of being at 5mg - but MAR stated MD ordered it at 5mg .  Waiting for call back/orders.n Pts HR 133-135 sustaining, BP-109/72. Will continue to monitor pt

## 2017-12-17 NOTE — ED Notes (Signed)
Date and time results received: 12/17/17 11:51 AM  (use smartphrase ".now" to insert current time)  Test: Troponin Critical Value: 0.90  Name of Provider Notified: Adriana Simas  Orders Received? Or Actions Taken?: Orders Received - See Orders for details

## 2017-12-17 NOTE — ED Notes (Signed)
Date and time results received: 12/17/17 @19 :42  Test: lactic acid 3.7 Critical Value: lactic acid 3.7  Name of Provider Notified: Dr Willette Pa  Orders Received? Or Actions Taken?:  No additional orders given,

## 2017-12-18 ENCOUNTER — Encounter (HOSPITAL_COMMUNITY): Payer: Self-pay | Admitting: Primary Care

## 2017-12-18 ENCOUNTER — Inpatient Hospital Stay (HOSPITAL_COMMUNITY): Payer: Medicare Other

## 2017-12-18 DIAGNOSIS — N183 Chronic kidney disease, stage 3 (moderate): Secondary | ICD-10-CM

## 2017-12-18 DIAGNOSIS — I480 Paroxysmal atrial fibrillation: Secondary | ICD-10-CM

## 2017-12-18 DIAGNOSIS — I5023 Acute on chronic systolic (congestive) heart failure: Secondary | ICD-10-CM

## 2017-12-18 DIAGNOSIS — R748 Abnormal levels of other serum enzymes: Secondary | ICD-10-CM

## 2017-12-18 DIAGNOSIS — I340 Nonrheumatic mitral (valve) insufficiency: Secondary | ICD-10-CM

## 2017-12-18 LAB — ECHOCARDIOGRAM LIMITED
Height: 70 in
Weight: 2723.12 oz

## 2017-12-18 LAB — GLUCOSE, CAPILLARY
Glucose-Capillary: 164 mg/dL — ABNORMAL HIGH (ref 65–99)
Glucose-Capillary: 180 mg/dL — ABNORMAL HIGH (ref 65–99)
Glucose-Capillary: 143 mg/dL — ABNORMAL HIGH (ref 65–99)
Glucose-Capillary: 174 mg/dL — ABNORMAL HIGH (ref 65–99)

## 2017-12-18 LAB — COMPREHENSIVE METABOLIC PANEL
ALK PHOS: 35 U/L — AB (ref 38–126)
ALT: 23 U/L (ref 17–63)
AST: 71 U/L — AB (ref 15–41)
Albumin: 2.2 g/dL — ABNORMAL LOW (ref 3.5–5.0)
Anion gap: 11 (ref 5–15)
BILIRUBIN TOTAL: 3.8 mg/dL — AB (ref 0.3–1.2)
BUN: 98 mg/dL — AB (ref 6–20)
CALCIUM: 7.7 mg/dL — AB (ref 8.9–10.3)
CO2: 18 mmol/L — ABNORMAL LOW (ref 22–32)
CREATININE: 2.58 mg/dL — AB (ref 0.61–1.24)
Chloride: 117 mmol/L — ABNORMAL HIGH (ref 101–111)
GFR calc Af Amer: 27 mL/min — ABNORMAL LOW (ref >60)
GFR, EST NON AFRICAN AMERICAN: 23 mL/min — AB (ref >60)
Glucose, Bld: 217 mg/dL — ABNORMAL HIGH (ref 65–99)
Potassium: 4.6 mmol/L (ref 3.5–5.1)
Sodium: 146 mmol/L — ABNORMAL HIGH (ref 135–145)
TOTAL PROTEIN: 6.6 g/dL (ref 6.5–8.1)

## 2017-12-18 LAB — RAPID URINE DRUG SCREEN, HOSP PERFORMED
AMPHETAMINES: NOT DETECTED
Barbiturates: NOT DETECTED
Benzodiazepines: NOT DETECTED
Cocaine: NOT DETECTED
Opiates: NOT DETECTED
Tetrahydrocannabinol: NOT DETECTED

## 2017-12-18 LAB — CBC
HCT: 32.8 % — ABNORMAL LOW (ref 39.0–52.0)
Hemoglobin: 11 g/dL — ABNORMAL LOW (ref 13.0–17.0)
MCH: 32.6 pg (ref 26.0–34.0)
MCHC: 33.5 g/dL (ref 30.0–36.0)
MCV: 97.3 fL (ref 78.0–100.0)
Platelets: 38 10*3/uL — ABNORMAL LOW (ref 150–400)
RBC: 3.37 MIL/uL — ABNORMAL LOW (ref 4.22–5.81)
RDW: 13.8 % (ref 11.5–15.5)
WBC: 26.6 10*3/uL — AB (ref 4.0–10.5)

## 2017-12-18 LAB — TROPONIN I
Troponin I: 0.65 ng/mL (ref <0.03)
Troponin I: 0.54 ng/mL (ref <0.03)

## 2017-12-18 LAB — MRSA PCR SCREENING: MRSA by PCR: POSITIVE — AB

## 2017-12-18 LAB — HEPARIN LEVEL (UNFRACTIONATED): Heparin Unfractionated: 0.44 IU/mL (ref 0.30–0.70)

## 2017-12-18 MED ORDER — MUPIROCIN 2 % EX OINT
1.0000 "application " | TOPICAL_OINTMENT | Freq: Two times a day (BID) | CUTANEOUS | Status: AC
  Administered 2017-12-18 – 2017-12-22 (×10): 1 via NASAL
  Filled 2017-12-18 (×2): qty 22

## 2017-12-18 MED ORDER — MUPIROCIN 2 % EX OINT
TOPICAL_OINTMENT | Freq: Every day | CUTANEOUS | Status: DC
  Administered 2017-12-18: 12:00:00 via TOPICAL
  Administered 2017-12-19 – 2017-12-21 (×3): 1 via TOPICAL
  Administered 2017-12-22 – 2017-12-23 (×2): via TOPICAL

## 2017-12-18 MED ORDER — FUROSEMIDE 10 MG/ML IJ SOLN
20.0000 mg | Freq: Once | INTRAMUSCULAR | Status: AC
  Administered 2017-12-18: 20 mg via INTRAVENOUS
  Filled 2017-12-18: qty 2

## 2017-12-18 MED ORDER — CHLORHEXIDINE GLUCONATE CLOTH 2 % EX PADS
6.0000 | MEDICATED_PAD | Freq: Every day | CUTANEOUS | Status: AC
  Administered 2017-12-18 – 2017-12-22 (×5): 6 via TOPICAL

## 2017-12-18 MED ORDER — SODIUM CHLORIDE 0.9 % IV SOLN
1.5000 g | Freq: Three times a day (TID) | INTRAVENOUS | Status: DC
  Administered 2017-12-18 – 2017-12-19 (×4): 1.5 g via INTRAVENOUS
  Filled 2017-12-18 (×7): qty 1.5

## 2017-12-18 MED ORDER — METHYLPREDNISOLONE SODIUM SUCC 40 MG IJ SOLR
40.0000 mg | Freq: Two times a day (BID) | INTRAMUSCULAR | Status: DC
  Administered 2017-12-18 – 2017-12-19 (×2): 40 mg via INTRAVENOUS
  Filled 2017-12-18 (×2): qty 1

## 2017-12-18 MED ORDER — FAMOTIDINE IN NACL 20-0.9 MG/50ML-% IV SOLN
20.0000 mg | INTRAVENOUS | Status: DC
  Administered 2017-12-18 – 2017-12-22 (×5): 20 mg via INTRAVENOUS
  Filled 2017-12-18 (×5): qty 50

## 2017-12-18 MED ORDER — DILTIAZEM HCL-DEXTROSE 100-5 MG/100ML-% IV SOLN (PREMIX)
5.0000 mg/h | INTRAVENOUS | Status: DC
  Administered 2017-12-18: 5 mg/h via INTRAVENOUS

## 2017-12-18 MED ORDER — METOPROLOL TARTRATE 5 MG/5ML IV SOLN
2.5000 mg | Freq: Three times a day (TID) | INTRAVENOUS | Status: DC
  Administered 2017-12-18 – 2017-12-20 (×7): 2.5 mg via INTRAVENOUS
  Filled 2017-12-18 (×7): qty 5

## 2017-12-18 MED ORDER — FUROSEMIDE 10 MG/ML IJ SOLN
INTRAMUSCULAR | Status: AC
  Administered 2017-12-18: 10 mg
  Filled 2017-12-18: qty 2

## 2017-12-18 NOTE — Progress Notes (Signed)
Taken down for MRI of heald

## 2017-12-18 NOTE — Consult Note (Signed)
Cardiology Consultation:   Patient ID: Juan Barber; [id number]; 1946-06-22   Admit date: 12/17/2017 Date of Consult: 12/18/2017  Primary Care Provider: Aliene Beams, MD Primary Cardiologist: Charlton Haws, MD  Primary Electrophysiologist:  na   Patient Profile:   Juan Barber is a 72 y.o. male with a hx of chronic systolic HF who is being seen today for the evaluation of elevated troponin at the request of Dr Gwenlyn Perking.  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 Admitted after being found down at home. From notes neihbors had not seen him since Thursday. Found on floor covered in urine. Unable to gather any information from patient, he does not answer questions.   In ER afib with RVR, started on IV dilt drip, complicated by hypotension. Given 4L NS in ER.    K 4.7 Cr 2.44 AST 74 GFR 29 WBC 6.1 Hgb 13.8 Plt 60 CK 898 Mg 2.8 Lactic acid 6.1 -->3.7 procalcitonin 47 TSH 1.211  Trop 0.9--> 0.88--> 0.65--> 0.54 10/2017 echo LVEF 40%, grade I diastoilc dysfunction CT head age indeterminate lacunar infarcts CXR possible LLL opacity 05/2017 cath: normal coronaries  Past Medical History:  Diagnosis Date  . Atrial fibrillation (HCC)   . Nonischemic cardiomyopathy (HCC)    a. echo in 05/2017 showing reduced EF of 30-35% - cath showing normal cors  . Seizures (HCC)     Past Surgical History:  Procedure Laterality Date  . HERNIA REPAIR    . LEFT HEART CATH AND CORONARY ANGIOGRAPHY N/A 06/05/2017   Procedure: LEFT HEART CATH AND CORONARY ANGIOGRAPHY;  Surgeon: [place], Peter M, MD;  Location: [facility] INVASIVE CV LAB;  Service: Cardiovascular;  Laterality: N/A;       Inpatient Medications: Scheduled Meds: . aspirin  300 mg Rectal Daily  . Chlorhexidine Gluconate Cloth  6 each Topical Q0600  . insulin aspart  0-15 Units Subcutaneous TID WC  . levalbuterol  0.63 mg Nebulization Q6H    . mouth rinse  15 mL Mouth Rinse BID  . metoprolol tartrate  25 mg Oral BID  . mupirocin ointment  1 application Nasal BID  . mupirocin ointment   Topical Daily  . sodium chloride flush  3 mL Intravenous Q12H   Continuous Infusions: . azithromycin Stopped (12/17/17 2200)  . cefTRIAXone (ROCEPHIN)  IV    . diltiazem (CARDIZEM) infusion 5 mg/hr (12/18/17 0400)   PRN Meds: acetaminophen **OR** acetaminophen, bisacodyl, ketorolac, levalbuterol, ondansetron **OR** ondansetron (ZOFRAN) IV  Allergies:    Allergies  Allergen Reactions  . Other     Pt reports "older" medication used for migraines  . Talwin [Pentazocine]     Sweating, rapid heartbeat    Social History:   Social History   Socioeconomic History  . Marital status: Divorced    Spouse name: Not on file  . Number of children: Not on file  . Years of education: Not on file  . Highest education level: Not on file  Occupational History  . Not on file  Social Needs  . Financial resource strain: Not on file  . Food insecurity:    Worry: Not on file    Inability: Not on file  . Transportation needs:    Medical: Not on file    Non-medical: Not on file  Tobacco Use  . Smoking status: Never Smoker  . Smokeless tobacco: Never Used  Substance and Sexual Activity  .  Alcohol use: Yes    Comment: occassional  . Drug use: No  . Sexual activity: Not on file  Lifestyle  . Physical activity:    Days per week: Not on file    Minutes per session: Not on file  . Stress: Not on file  Relationships  . Social connections:    Talks on phone: Not on file    Gets together: Not on file    Attends religious service: Not on file    Active member of club or organization: Not on file    Attends meetings of clubs or organizations: Not on file    Relationship status: Not on file  . Intimate partner violence:    Fear of current or ex partner: Not on file    Emotionally abused: Not on file    Physically abused: Not on file    Forced  sexual activity: Not on file  Other Topics Concern  . Not on file  Social History Narrative  . Not on file    Family History:    Family History  Problem Relation Age of Onset  . Heart attack Mother        had AMI in 21s  . AAA (abdominal aortic aneurysm) Neg Hx      ROS:  Patient does not answer questions.   Physical Exam/Data:   Vitals:   12/18/17 0530 12/18/17 0600 12/18/17 0740 12/18/17 0838  BP: 107/64 109/63    Pulse: 90 90 93   Resp: (!) 24 (!) 28 (!) 25   Temp: 98.4 F (36.9 C) 98.2 F (36.8 C) 97.9 F (36.6 C)   TempSrc:      SpO2: 98% 98% 97% 99%  Weight:      Height:        Intake/Output Summary (Last 24 hours) at 12/18/2017 1024 Last data filed at 12/18/2017 0500 Gross per 24 hour  Intake 3130.87 ml  Output 800 ml  Net 2330.87 ml   Filed Weights   12/17/17 1103 12/17/17 2007 12/18/17 0500  Weight: 174 lb 8 oz (79.2 kg) 169 lb 5 oz (76.8 kg) 170 lb 3.1 oz (77.2 kg)   Body mass index is 24.42 kg/m.  General:  Well nourished, well developed, in no acute distress HEENT: normal Lymph: no adenopathy Neck: no JVD Endocrine:  No thryomegaly Vascular: No carotid bruits; FA pulses 2+ bilaterally without bruits  Cardiac:  normal S1, S2; RRR; no murmur Lungs:  Coarse biaterally Abd: soft, nontender, no hepatomegaly  Ext: no edema Musculoskeletal:  No deformities, BUE and BLE strength normal and equal Skin: warm and dry  Neuro:  No focal deficits Psych:  Unable to assess, patient does not answer questions.    Laboratory Data:  Chemistry Recent Labs  Lab 12/17/17 1105 12/18/17 0424  NA 142 146*  K 4.7 4.6  CL 105 117*  CO2 22 18*  GLUCOSE 172* 217*  BUN 105* 98*  CREATININE 2.44* 2.58*  CALCIUM 9.1 7.7*  GFRNONAA 25* 23*  GFRAA 29* 27*  ANIONGAP 15 11    Recent Labs  Lab 12/17/17 1105 12/18/17 0424  PROT 8.4* 6.6  ALBUMIN 3.0* 2.2*  AST 74* 71*  ALT 30 23  ALKPHOS 45 35*  BILITOT 3.5* 3.8*   Hematology Recent Labs  Lab  12/17/17 1105 12/18/17 0424  WBC 6.1 26.6*  RBC 4.25 3.37*  HGB 13.8 11.0*  HCT 41.4 32.8*  MCV 97.4 97.3  MCH 32.5 32.6  MCHC 33.3 33.5  RDW 13.1  13.8  PLT 60* 38*   Cardiac Enzymes Recent Labs  Lab 12/17/17 1105 12/17/17 1759 12/18/17 0047 12/18/17 0424  TROPONINI 0.90* 0.88* 0.65* 0.54*   No results for input(s): TROPIPOC in the last 168 hours.  BNPNo results for input(s): BNP, PROBNP in the last 168 hours.  DDimer No results for input(s): DDIMER in the last 168 hours.  Radiology/Studies:  Ct Head Wo Contrast  Result Date: 12/17/2017 CLINICAL DATA:  Found down in his apartment. Laceration to left cheek. EXAM: CT HEAD WITHOUT CONTRAST CT MAXILLOFACIAL WITHOUT CONTRAST CT CERVICAL SPINE WITHOUT CONTRAST TECHNIQUE: Multidetector CT imaging of the head, cervical spine, and maxillofacial structures were performed using the standard protocol without intravenous contrast. Multiplanar CT image reconstructions of the cervical spine and maxillofacial structures were also generated. COMPARISON:  Cervical spine x-rays dated August 03, 2008. 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 No evidence of hemorrhage, hydrocephalus, extra-axial collection or mass lesion/mass effect. Mild to moderate generalized cerebral atrophy. Mild scattered periventricular and subcortical white matter hypodensities are nonspecific, but favored to reflect chronic microvascular ischemic changes. Vascular: Atherosclerotic vascular calcification of the carotid siphons. No hyperdense vessel. Skull: Normal. Negative for fracture or focal lesion. Other: Small left frontal scalp hematoma. CT MAXILLOFACIAL FINDINGS Osseous: No fracture or mandibular dislocation. No destructive process. Periapical lucencies involving the right maxillary first and second molars. Orbits: Negative. No traumatic or inflammatory finding. Sinuses: Chronic appearing mild-to-moderate  moderate mucosal thickening of the paranasal sinuses, moderate in the left maxillary sinus. The mastoid air cells are clear. Soft tissues: Left cheek laceration. CT CERVICAL SPINE FINDINGS Alignment: Reversal of the normal cervical lordosis, centered at C5, unchanged. No traumatic malalignment. Skull base and vertebrae: No acute cervical spine fracture. Mild anterior superior endplate height loss at T1 with associated Schmorl's node. No primary bone lesion or focal pathologic process. Soft tissues and spinal canal: Mild prevertebral soft tissue thickening. Subcutaneous stranding and fluid in the left neck. No visible canal hematoma. Disc levels: Moderate to severe disc height loss at C2-C3, C5-C6, and C6-C7, progressed when compared to prior study. Mild bilateral neuroforaminal stenosis at C5-C6 and C6-C7. Upper chest: Negative. Other: 2.9 cm superficial subcutaneous cystic lesion in the right posterior neck, likely a sebaceous cyst. IMPRESSION: 1. 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 2. No acute cervical spine fracture. Mild prevertebral soft tissue thickening and subcutaneous stranding/fluid in the left neck. Ligamentous and/or soft tissue injury is not excluded. Recommend MRI cervical spine for further evaluation. 3. Mild anterior superior endplate height loss at T1 with associated Schmorl's node is favored chronic. 4.  No acute facial fracture. 5. Small left frontal scalp hematoma.  Left cheek laceration. Electronically Signed   By: Obie Dredge M.D.   On: 12/17/2017 12:06   Ct Cervical Spine Wo Contrast  Result Date: 12/17/2017 CLINICAL DATA:  Found down in his apartment. Laceration to left cheek. EXAM: CT HEAD WITHOUT CONTRAST CT MAXILLOFACIAL WITHOUT CONTRAST CT CERVICAL SPINE WITHOUT CONTRAST TECHNIQUE: Multidetector CT imaging of the head, cervical spine, and maxillofacial structures were performed using the standard protocol without intravenous contrast.  Multiplanar CT image reconstructions of the cervical spine and maxillofacial structures were also generated. COMPARISON:  Cervical spine x-rays dated August 03, 2008. 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 No evidence of hemorrhage, hydrocephalus, extra-axial collection or mass lesion/mass effect. Mild to moderate generalized cerebral atrophy. Mild scattered periventricular  and subcortical white matter hypodensities are nonspecific, but favored to reflect chronic microvascular ischemic changes. Vascular: Atherosclerotic vascular calcification of the carotid siphons. No hyperdense vessel. Skull: Normal. Negative for fracture or focal lesion. Other: Small left frontal scalp hematoma. CT MAXILLOFACIAL FINDINGS Osseous: No fracture or mandibular dislocation. No destructive process. Periapical lucencies involving the right maxillary first and second molars. Orbits: Negative. No traumatic or inflammatory finding. Sinuses: Chronic appearing mild-to-moderate moderate mucosal thickening of the paranasal sinuses, moderate in the left maxillary sinus. The mastoid air cells are clear. Soft tissues: Left cheek laceration. CT CERVICAL SPINE FINDINGS Alignment: Reversal of the normal cervical lordosis, centered at C5, unchanged. No traumatic malalignment. Skull base and vertebrae: No acute cervical spine fracture. Mild anterior superior endplate height loss at T1 with associated Schmorl's node. No primary bone lesion or focal pathologic process. Soft tissues and spinal canal: Mild prevertebral soft tissue thickening. Subcutaneous stranding and fluid in the left neck. No visible canal hematoma. Disc levels: Moderate to severe disc height loss at C2-C3, C5-C6, and C6-C7, progressed when compared to prior study. Mild bilateral neuroforaminal stenosis at C5-C6 and C6-C7. Upper chest: Negative. Other: 2.9 cm superficial subcutaneous cystic lesion in the right  posterior neck, likely a sebaceous cyst. IMPRESSION: 1. 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 2. No acute cervical spine fracture. Mild prevertebral soft tissue thickening and subcutaneous stranding/fluid in the left neck. Ligamentous and/or soft tissue injury is not excluded. Recommend MRI cervical spine for further evaluation. 3. Mild anterior superior endplate height loss at T1 with associated Schmorl's node is favored chronic. 4.  No acute facial fracture. 5. Small left frontal scalp hematoma.  Left cheek laceration. Electronically Signed   By: Obie Dredge M.D.   On: 12/17/2017 12:06   Portable Chest X-ray 1 View  Result Date: 12/18/2017 CLINICAL DATA:  Sepsis, AFib EXAM: PORTABLE CHEST 1 VIEW COMPARISON:  12/17/2017 FINDINGS: Mild lingular and bilateral lower lobe opacities, likely atelectasis, although pneumonia remains possible. No frank interstitial edema.  No pleural effusion or pneumothorax. The heart is normal in size. IMPRESSION: Mild lingular and bilateral lower lobe opacities, likely atelectasis, although pneumonia remains possible. Electronically Signed   By: Charline Bills M.D.   On: 12/18/2017 07:35   Dg Chest Port 1 View  Result Date: 12/17/2017 CLINICAL DATA:  Chest pain, altered mental status EXAM: PORTABLE CHEST 1 VIEW COMPARISON:  Chest x-ray of 05/18/2017 FINDINGS: No active infiltrate or effusion is seen. Mediastinal and hilar contours are unremarkable. The heart is borderline enlarged and stable. No bony abnormality is seen. IMPRESSION: No active lung disease.  Heart upper normal in size. Electronically Signed   By: Dwyane Dee M.D.   On: 12/17/2017 11:56   Dg Chest Port 1v Same Day  Result Date: 12/17/2017 CLINICAL DATA:  72 year old male with code sepsis, and evolving lung sounds EXAM: PORTABLE CHEST 1 VIEW COMPARISON:  Chest x-ray obtained earlier today at 11:40 a.m. FINDINGS: Significantly lower inspiratory volumes with developing  lingular and left lower lobe streaky airspace opacities favored to reflect atelectasis. No overt pulmonary edema, pneumothorax or pleural effusion. Stable cardiac and mediastinal contours. No acute osseous abnormality. IMPRESSION: 1. Lower inspiratory volumes with developing streaky lingular and left lower lobe airspace opacities favored to reflect atelectasis. Electronically Signed   By: Malachy Moan M.D.   On: 12/17/2017 18:08   Ct Maxillofacial Wo Cm  Result Date: 12/17/2017 CLINICAL DATA:  Found down in his apartment. Laceration to left cheek. EXAM: CT  HEAD WITHOUT CONTRAST CT MAXILLOFACIAL WITHOUT CONTRAST CT CERVICAL SPINE WITHOUT CONTRAST TECHNIQUE: Multidetector CT imaging of the head, cervical spine, and maxillofacial structures were performed using the standard protocol without intravenous contrast. Multiplanar CT image reconstructions of the cervical spine and maxillofacial structures were also generated. COMPARISON:  Cervical spine x-rays dated August 03, 2008. 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 No evidence of hemorrhage, hydrocephalus, extra-axial collection or mass lesion/mass effect. Mild to moderate generalized cerebral atrophy. Mild scattered periventricular and subcortical white matter hypodensities are nonspecific, but favored to reflect chronic microvascular ischemic changes. Vascular: Atherosclerotic vascular calcification of the carotid siphons. No hyperdense vessel. Skull: Normal. Negative for fracture or focal lesion. Other: Small left frontal scalp hematoma. CT MAXILLOFACIAL FINDINGS Osseous: No fracture or mandibular dislocation. No destructive process. Periapical lucencies involving the right maxillary first and second molars. Orbits: Negative. No traumatic or inflammatory finding. Sinuses: Chronic appearing mild-to-moderate moderate mucosal thickening of the paranasal sinuses, moderate in the left  maxillary sinus. The mastoid air cells are clear. Soft tissues: Left cheek laceration. CT CERVICAL SPINE FINDINGS Alignment: Reversal of the normal cervical lordosis, centered at C5, unchanged. No traumatic malalignment. Skull base and vertebrae: No acute cervical spine fracture. Mild anterior superior endplate height loss at T1 with associated Schmorl's node. No primary bone lesion or focal pathologic process. Soft tissues and spinal canal: Mild prevertebral soft tissue thickening. Subcutaneous stranding and fluid in the left neck. No visible canal hematoma. Disc levels: Moderate to severe disc height loss at C2-C3, C5-C6, and C6-C7, progressed when compared to prior study. Mild bilateral neuroforaminal stenosis at C5-C6 and C6-C7. Upper chest: Negative. Other: 2.9 cm superficial subcutaneous cystic lesion in the right posterior neck, likely a sebaceous cyst. IMPRESSION: 1. 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 2. No acute cervical spine fracture. Mild prevertebral soft tissue thickening and subcutaneous stranding/fluid in the left neck. Ligamentous and/or soft tissue injury is not excluded. Recommend MRI cervical spine for further evaluation. 3. Mild anterior superior endplate height loss at T1 with associated Schmorl's node is favored chronic. 4.  No acute facial fracture. 5. Small left frontal scalp hematoma.  Left cheek laceration. Electronically Signed   By: Obie Dredge M.D.   On: 12/17/2017 12:06    Assessment and Plan:   1. Sepsis - from notes seconday to UTI vs pneumonia. Multiorgan failure with AKI, abnormal liver labs.  - WBC up to 26.6  2. Elevated troponin - likely demand ischemia, normal cath just 6 months ago - no plans for further ischemic testing - no anticoag indicated, particularly with his low platelets   3. AKI - per primary team, avoid nephrotoxic drugs. Hold home losartan.   4. Thomrbocytopenia - platelets 60 on admission, down to  38 - hold xarelto, stop aspirin   5. Hyperibilirubinemia - per primary ream.   6. Afib - hold anticoag due to thrombocytopenia - dilt not best option in setting of systolic dysfuntion - change to IV lopressor as unclear if he will be able to take po due to mental status   7. Chronic systolic HF - aggressive IVFs initially on admission in setting of sepsis. Currently volume overloaded - some volume overload, very gentle lasix IV 20mg  x 1. Bp's are stable, will need to monitor renal function.      For questions or updates, please contact [facility] Please consult [email or web address] for contact info under Cardiology/STEMI.   Signed, Dina Rich,  MD  12/18/2017 10:24 AM

## 2017-12-18 NOTE — Consult Note (Signed)
WOC Nurse wound consult note Reason for Consult:Remote (camera) consult with bedside RN completed for traumatic wounds after being found down for an extended period of time.   Left inframammary region with large adherent unknown material present unclear if this is a scab or a foreign body as it is quite adhered to the skin.  Will consult surgery if MD agrees, Scattered traumatic lesions to left face, bilateral knees from fall at home.  Wound type:traumatic Pressure Injury POA: NA Measurement: Left knee:  2.5 cm x 2 cm x 0.2 cm  Right knee:  2 cmx 1.8 cm x 0.1 cm  Left anterior chest"  4 cm x 6 cm adherent covering. Unclear if foreign body or scabbed leison Wound bed: pink and moist Drainage (amount, consistency, odor) minimal serosanguinous  No odor Periwound:intact Dressing procedure/placement/frequency: Cleanse wounds to bilateral knees and face with NS.  Apply mupirocin ointment.  Cover with dry dressing daily.   Warm compress to left chest lesion.  Attempting to loosen if foreign body from prolonged time down at home.  Will not follow at this time.  Please re-consult if needed.  Maple Hudson RN BSN CWON Pager [phone]

## 2017-12-18 NOTE — Progress Notes (Signed)
Initial Nutrition Assessment  DOCUMENTATION CODES:      INTERVENTION:  RD will continue to follow    NUTRITION DIAGNOSIS:   Increased nutrient needs related to acute illness(sepsis (UTI), rhabdomyolysis, asp. pneumonia) as evidenced by estimated needs.  GOAL:   Provide needs based on ASPEN/SCCM guidelines(If feasible and agreeable once patient goals of care are established)  MONITOR:   Diet advancement, Skin, Weight trends, I & O's, PO intake  REASON FOR ASSESSMENT:   Malnutrition Screening Tool    ASSESSMENT:  Presents with sepsis secondary to UTI, acute renal failure related to rhabdomyolysis, aspiration pneumonia, closed fx right radius, elevated troponin. Hx of chronic CHF  MRI today: cervical edema, advanced disc degeneration C5-6 &C-6-7. CT Cervical spine no acute facial fx. He was found face down in his home. ST assessment completed: Patient has dysphagia. NPO recommended at this time. Identified as severe aspiration risk. ST suspects neurologically related dysphagia. Patient apparently lives alone. Unclear at this time what if any family support he has.  Palliative consulted- met briefly with son and daughter and is to follow up with them tomorrow.  His weight is down slightly. Unsure at this time how long the patient was on the floor without food or drink or what his usual intake is. He is not responding to questions at this time. Discussed pt with nursing. RD will follow up tomorrow.  Intake/Output Summary (Last 24 hours) at 12/18/2017 1711 Last data filed at 12/18/2017 1557 Gross per 24 hour  Intake 992.21 ml  Output 800 ml  Net 192.21 ml      Labs: BMP Latest Ref Rng & Units 12/18/2017 12/17/2017 06/04/2017  Glucose 65 - 99 mg/dL 217(H) 172(H) 127(H)  BUN 6 - 20 mg/dL 98(H) 105(H) 14  Creatinine 0.61 - 1.24 mg/dL 2.58(H) 2.44(H) 1.23  BUN/Creat Ratio 6 - 22 (calc) - - -  Sodium 135 - 145 mmol/L 146(H) 142 129(L)  Potassium 3.5 - 5.1 mmol/L 4.6 4.7 4.5  Chloride  101 - 111 mmol/L 117(H) 105 97(L)  CO2 22 - 32 mmol/L 18(L) 22 25  Calcium 8.9 - 10.3 mg/dL 7.7(L) 9.1 8.7(L)    Past Medical History:  Diagnosis Date  . Atrial fibrillation ([place])   . Nonischemic cardiomyopathy ([place])    a. echo in 05/2017 showing reduced EF of 30-35% - cath showing normal cors  . Seizures ([place])     Past Surgical History:  Procedure Laterality Date  . HERNIA REPAIR    . LEFT HEART CATH AND CORONARY ANGIOGRAPHY N/A 06/05/2017   Procedure: LEFT HEART CATH AND CORONARY ANGIOGRAPHY;  Surgeon: Martinique, Peter M, MD;  Location: [place] CV LAB;  Service: Cardiovascular;  Laterality: N/A;   Scheduled Meds: . Chlorhexidine Gluconate Cloth  6 each Topical Q0600  . insulin aspart  0-15 Units Subcutaneous TID WC  . levalbuterol  0.63 mg Nebulization Q6H  . mouth rinse  15 mL Mouth Rinse BID  . methylPREDNISolone (SOLU-MEDROL) injection  40 mg Intravenous Q12H  . metoprolol tartrate  2.5 mg Intravenous Q8H  . mupirocin ointment  1 application Nasal BID  . mupirocin ointment   Topical Daily  . sodium chloride flush  3 mL Intravenous Q12H    NUTRITION - FOCUSED PHYSICAL EXAM: Deferred -  Diet Order:   Diet Order           Diet NPO time specified  Diet effective now          EDUCATION NEEDS:   Not appropriate for education  at this time Skin:  Skin Assessment: Reviewed RN Assessment(MASD and multiple abrasions)  Last BM:  unknown  Height:   Ht Readings from Last 1 Encounters:  12/17/17 '5\' 10"'$  (1.778 m)    Weight:   Wt Readings from Last 1 Encounters:  12/18/17 170 lb 3.1 oz (77.2 kg)    Ideal Body Weight:  75 kg  BMI:  Body mass index is 24.42 kg/m.  Estimated Nutritional Needs:   Kcal:  2156-2387 (28-31 kcal/kg/bw)  Protein:  92-108gr (1.2-1.4 gr/kg /bw)  Fluid:  24-hr Urine output + 500 ml insensible losses  Colman Cater MS,RD,CSG,LDN Office: [phone] Pager: [phone]

## 2017-12-18 NOTE — Plan of Care (Signed)
  Problem: SLP Dysphagia Goals Goal: Patient will demonstrate readiness for PO's Description Patient will demonstrate readiness for PO's and/or instrumental swallow study as evidenced by: Flowsheets (Taken 12/18/2017 1426) Patient will demonstrate readiness for PO's and/or instrumental swallow study as evidenced by: sustained attention to PO trials;initiation of swallow sequence;ability to manage secretions;with mod assist  Thank you,  Havery Moros, CCC-SLP [phone]

## 2017-12-18 NOTE — Evaluation (Signed)
Clinical/Bedside Swallow Evaluation Patient Details  Name: Juan Barber MRN: [id number] Date of Birth: 08-20-1945  Today's Date: 12/18/2017 Time: SLP Start Time (ACUTE ONLY): 1350 SLP Stop Time (ACUTE ONLY): 1413 SLP Time Calculation (min) (ACUTE ONLY): 23 min  Past Medical History:  Past Medical History:  Diagnosis Date  . Atrial fibrillation (HCC)   . Nonischemic cardiomyopathy (HCC)    a. echo in 05/2017 showing reduced EF of 30-35% - cath showing normal cors  . Seizures (HCC)    Past Surgical History:  Past Surgical History:  Procedure Laterality Date  . HERNIA REPAIR    . LEFT HEART CATH AND CORONARY ANGIOGRAPHY N/A 06/05/2017   Procedure: LEFT HEART CATH AND CORONARY ANGIOGRAPHY;  Surgeon: [place], Peter M, MD;  Location: [facility] INVASIVE CV LAB;  Service: Cardiovascular;  Laterality: N/A;   HPI:  Juan Barber a 72 y.o.malewith medical history significant ofatrial fibrillation, nonischemic cardiomyopathy, seizures, paranoid behaviors after serving in the [employer] who was found down on his home after a welfare check was done on his apartment. Neighbors had not seen him since Thursday and the police was present at his apartment for another reason and the apartment staff decided to check on the patient. They found him down on the floor unresponsive covered in urine. Brought into the emergency department where he was noted to be in atrial fibrillation with rapid ventricular response. He was given multiple medications including diltiazem and Lopressor to improve his heart rate. Ultimately was started on a diltiazem drip. After that his blood pressure dropped when he spiked a temperature to 1.9 in the emergency department. Blood pressure had been in the 180s systolic and then dropped to the 78/47 range. Received 4 L of IV fluid and his blood pressure has improved to the mid to low 100s currently 107/62. The patient was found to have markedly infected urine, and elevated  troponin, elevated blood glucoses, and elevated bilirubin, and evaded CPK assistant with acute rhabdomyolysis. CT scan of the head showed lacunar infarcts of undetermined age, no acute C-spine fracture some soft tissue swelling of the left knee no facial fractures and a left frontal scalp hematoma. Sepsis protocol was initiated and the patient received appropriate IV fluids, IV antibiotics, and take acid was found to be 6 with a repeat of 3. Once he received that significant amount of IV fluid his chest became more congested. Repeat chest x-ray revealed left lower lobe atelectasis but given concerns regarding his pulmonary health and possible aspiration associated with mental status changes while on the ground antibiotics were started to cover the patient for both a severe urinary tract infection and a pneumonia. MR Cervical spine shows:Diffuse cervical prevertebral fluid/edema. This could reflect posttraumatic effusion and soft tissue injury versus early infectious discitis at C5-6 and C6-7. 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 MRI completed this AM, but results are still pending. BSE requested.    Assessment / Plan / Recommendation Clinical Impression  Pt roused and opened eyes to verbal stimuli. He was leaving sharply to the left and head turned sharply to the left. SLP provided assistance for repositioning and Pt unable/unwilling to provide assist. SLP gently repositioned head/neck to more upright position and Pt winced and moved his left arm as if in pain. Oral care provided with suction and verbal/tactile cues from SLP. Pt initially clenched his teeth, but did eventually allow SLP to complete whole mouth care. Pt exhibited hyolaryngeal pumping and  audible throat clearing at times. Ice chips presented to lips and oral cavity and Pt made little attempt to manipulate and it was eventually suctioned. Pt appears to  have a neurological basis to his dysphagia, however is also not fully alert at this time. Recommend NPO with SLP to re-evaluate and nursing staff to provided oral care according to NPO status with encouragement for Pt to cough/clear throat as he appears to be having difficulty managing his secretions. Above to RN. SLP will follow.   SLP Visit Diagnosis: Dysphagia, oropharyngeal phase (R13.12)    Aspiration Risk  Risk for inadequate nutrition/hydration;Severe aspiration risk    Diet Recommendation NPO   Medication Administration: Via alternative means    Other  Recommendations Oral Care Recommendations: Oral care QID;Staff/trained caregiver to provide oral care   Follow up Recommendations Skilled Nursing facility(pending hospital course and recovery)      Frequency and Duration min 3x week  1 week       Prognosis Prognosis for Safe Diet Advancement: Guarded Barriers to Reach Goals: Severity of deficits      Swallow Study   General Date of Onset: 12/17/17(However Pt last seen well on 12/13/17) HPI: Juan Barber a 72 y.o.malewith medical history significant ofatrial fibrillation, nonischemic cardiomyopathy, seizures, paranoid behaviors after serving in the [employer] who was found down on his home after a welfare check was done on his apartment. Neighbors had not seen him since Thursday and the police was present at his apartment for another reason and the apartment staff decided to check on the patient. They found him down on the floor unresponsive covered in urine. Brought into the emergency department where he was noted to be in atrial fibrillation with rapid ventricular response. He was given multiple medications including diltiazem and Lopressor to improve his heart rate. Ultimately was started on a diltiazem drip. After that his blood pressure dropped when he spiked a temperature to 1.9 in the emergency department. Blood pressure had been in the 180s systolic and then  dropped to the 78/47 range. Received 4 L of IV fluid and his blood pressure has improved to the mid to low 100s currently 107/62. The patient was found to have markedly infected urine, and elevated troponin, elevated blood glucoses, and elevated bilirubin, and evaded CPK assistant with acute rhabdomyolysis. CT scan of the head showed lacunar infarcts of undetermined age, no acute C-spine fracture some soft tissue swelling of the left knee no facial fractures and a left frontal scalp hematoma. Sepsis protocol was initiated and the patient received appropriate IV fluids, IV antibiotics, and take acid was found to be 6 with a repeat of 3. Once he received that significant amount of IV fluid his chest became more congested. Repeat chest x-ray revealed left lower lobe atelectasis but given concerns regarding his pulmonary health and possible aspiration associated with mental status changes while on the ground antibiotics were started to cover the patient for both a severe urinary tract infection and a pneumonia. MR Cervical spine shows:Diffuse cervical prevertebral fluid/edema. This could reflect posttraumatic effusion and soft tissue injury versus early infectious discitis at C5-6 and C6-7. 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 MRI completed this AM, but results are still pending. BSE requested.  Type of Study: Bedside Swallow Evaluation Previous Swallow Assessment: None on record Diet Prior to this Study: NPO Temperature Spikes Noted: Yes Respiratory Status: Nasal cannula History of Recent Intubation: No Behavior/Cognition: Alert;Requires cueing;Doesn't follow  directions;Lethargic/Drowsy Oral Cavity Assessment: (some secretions) Oral Care Completed by SLP: Yes Oral Cavity - Dentition: Adequate natural dentition(missing some dentition) Vision: Impaired for self-feeding Self-Feeding Abilities: Total  assist Patient Positioning: Upright in bed(pt leaning to left and head turned to left) Baseline Vocal Quality: Not observed Volitional Cough: Weak;Congested Volitional Swallow: Unable to elicit(pumping of hyolaryngeal complex observed)    Oral/Motor/Sensory Function Overall Oral Motor/Sensory Function: Mild impairment(difficult to assess due to limited participation) Facial ROM: Within Functional Limits Facial Symmetry: Within Functional Limits Lingual ROM: Reduced right;Reduced left Lingual Symmetry: Within Functional Limits Lingual Strength: Reduced Velum: (could not observe)   Ice Chips Ice chips: Impaired Presentation: Spoon Oral Phase Impairments: Reduced labial seal;Reduced lingual movement/coordination;Poor awareness of bolus Oral Phase Functional Implications: Oral holding;Left lateral sulci pocketing Pharyngeal Phase Impairments: Suspected delayed Swallow;Unable to trigger swallow(oral suction provided to remove)   Thin Liquid Thin Liquid: Not tested    Nectar Thick Nectar Thick Liquid: Not tested   Honey Thick Honey Thick Liquid: Not tested   Puree Puree: Not tested   Solid   Thank you,  Havery Moros, CCC-SLP [phone]    Solid: Not tested        Carnita Golob 12/18/2017,2:19 PM

## 2017-12-18 NOTE — Progress Notes (Signed)
Patient taken down for MRI. All IV medication had to be stopped and temp foley had to be removed. Patient just now at 0816 is back from MRI

## 2017-12-18 NOTE — Progress Notes (Signed)
Pt HR increased to 130's sustaining- Dr. Sharl Ma paged and made aware. Waiting for call back/orders.

## 2017-12-18 NOTE — Plan of Care (Signed)
Palliative: Brief meeting with family today.  Juan Barber is unresponsive, will grimace when I move his head.  Family at bedside today includes daughter Lecil Moffa and son Domenic Schwab along with extended family and friends.  We talked about 24 to 48 hours for outcomes.  I share the palliative medicine is an extra layer of support, here to answer questions, help with symptom management and discharge plan.  Family meeting planned for 5/8 at 10:30 AM. No charge Lillia Carmel, NP Palliative Medicine Team Team Phone # [phone]

## 2017-12-18 NOTE — Progress Notes (Signed)
Dr. Sharl Ma informed that pts Urine drug screen was negative.

## 2017-12-18 NOTE — Progress Notes (Signed)
*  PRELIMINARY RESULTS* Echocardiogram 2D Echocardiogram LIMITED has been performed.  Jeryl Columbia 12/18/2017, 1:10 PM

## 2017-12-18 NOTE — Progress Notes (Addendum)
[facility] PROGRESS NOTE  Juan Barber ZOX:[id number] DOB: [birth year] DOA: 12/17/2017 PCP: Aliene Beams, MD  Interim summary and HPI 72 y.o. 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  Neighbors had not seen him since Thursday and the police was present at his apartment for another reason and the apartment staff decided to check on the patient.  They found him down on the floor unresponsive covered in urine.  Brought into the emergency department where he was noted to be in atrial fibrillation with rapid ventricular response.  found with UTI, concerns for aspiration PNA and acute encephalopathy.   Assessment/Plan: 1-sepsis: due to UTI vs aspiration PNA -currently afebrile -elevated WBC's in 20K range, procalcitonin level 46.76; HR is controlled. -continue IV antibiotics -follow clinical response -low dose steroids started   2-acute encephalopathy -multifactorial: due to acute stroke and due to sepsis; patient also with worsening renal function. -will avoid oral agents; follow SPL and PT/OT -stroke work up to be completed and will check EEG -neurology consulted  -patient's stroke secondary to A. Fib -no anticoagulation currently in the setting of thrombocytopenia.   3-atrial fibrillation: chronic; with RVR on admission  -controlled and sinus now -stop cardizem drip as recommended by cardiologist  -will use IV metoprolol -follow HR -given low platelets count will stop heparin drip  4-elevated troponin -in the setting of demand ischemia with sepsis, and A. Fib with RVR. -worsening of renal function also contributing. -continue metoprolol -no ASA due to low platelets  5-acute on chronic systolic heart failure  -in the setting of aggressive IVF's given on admission for sepsis -one dose of  lasix given  -follow daily weights and strict intake and output -breathing is better now -2-D echo with EF 40-45% -no ACE or ARB with worsening renal function  -continue b-blocker  6-rhabdomyolysis -patient found down after multiple days -received IVF's on admission  -now no further fluids due to HF exacerbation.  7-thrombocytopenia -probably due to sepsis -avoiding heparin and ASA for now -will check smears -steroids started to help with BP  8-GERD/GI prophylaxis  -continue famotidine  9-skin injury and knees abrasion  -wound care consulted and will follow rec's -for his abd wound will have surgery to see, looks like he might need some debridement.  10-acute on chronic renal failure -patient with stage 3 at baseline base on previous GFR -acute worsening in the setting of dehydration and rhabdomyolysis  -minimize nephrotoxic agents  -follow renal function -continue tx for UTI  Code Status: Full  Family Communication: daughter at bedside  Disposition Plan: guard prognosis; neurology consultation pending, antibiotics transitioned to unasyn (coverin urine and aspiration); d/c heparin drip and Cardizem drip. Palliative care consulted.    Consultants:  Cardiology  Neurology  General surgery   Palliative care  Procedures:  EEG: Pending  2D echo: - Left ventricle: The cavity size was normal. Wall thickness was   normal. Systolic function was mildly to moderately reduced. The   estimated ejection fraction was in the range of 40% to 45%.   Doppler parameters are consistent with abnormal left ventricular   relaxation (grade 1 diastolic dysfunction). - Aortic valve: Mildly calcified annulus. Trileaflet; normal   thickness leaflets. Valve area (VTI): 3.01 cm^2. Valve area   (Vmax): 2.6 cm^2. - Mitral valve: Mildly calcified annulus. Normal thickness leaflets   . There was mild  regurgitation. - Left atrium: The atrium was mildly dilated. - Limited echo to evaluate LV  function.   See below for x-ray reports  Antibiotics:  Unasyn 12/18/17  HPI/Subjective: Currently afebrile, unable to speak or follow commands, sinus rhythm and rate control appreciated on telemetry.  No nausea/vomiting.  Objective: Vitals:   12/18/17 1603 12/18/17 1800  BP:  115/69  Pulse: 92 97  Resp: 20 (!) 22  Temp: 97.8 F (36.6 C)   SpO2: 99% 100%    Intake/Output Summary (Last 24 hours) at 12/18/2017 1856 Last data filed at 12/18/2017 1851 Gross per 24 hour  Intake 992.21 ml  Output 2100 ml  Net -1107.79 ml   Filed Weights   12/17/17 1103 12/17/17 2007 12/18/17 0500  Weight: 79.2 kg (174 lb 8 oz) 76.8 kg (169 lb 5 oz) 77.2 kg (170 lb 3.1 oz)    Exam:   General: Nonverbal, currently afebrile, able to partially track with his eye towards the left; but nothing else. Mild tachypnea and with 2  supplementation in place.  Cardiovascular: Regular rate and currently sinus rhythm, no rubs, no gallops, mild JVD.  Respiratory: Positive tachypnea, scattered rhonchi, positive bibasilar fine crackles on exam, no using accessory muscles.  Abdomen: Left upper quadrant with wound (having indurated edges, no active drainage and extensions of erythema and petechia all the way down to his left lower quadrant); positive bowel sounds, no guarding, soft.  Musculoskeletal: No edema, no cyanosis, no clubbing.   Neurologic exam: Unable to follow commands, nonverbal, patient open his eyes and is partially able to track towards the left; he is moving spontaneously both of his lower extremities but is unable to squeeze my fingers or sustains upper extremities against gravity.  Data Reviewed: Basic Metabolic Panel: Recent Labs  Lab 12/17/17 1105 12/17/17 1146 12/18/17 0424  NA 142  --  146*  K 4.7  --  4.6  CL 105  --  117*  CO2 22  --  18*  GLUCOSE 172*  --  217*  BUN 105*  --  98*  CREATININE 2.44*  --  2.58*  CALCIUM 9.1  --  7.7*  MG  --  2.8*  --    Liver Function  Tests: Recent Labs  Lab 12/17/17 1105 12/18/17 0424  AST 74* 71*  ALT 30 23  ALKPHOS 45 35*  BILITOT 3.5* 3.8*  PROT 8.4* 6.6  ALBUMIN 3.0* 2.2*   CBC: Recent Labs  Lab 12/17/17 1105 12/18/17 0424  WBC 6.1 26.6*  HGB 13.8 11.0*  HCT 41.4 32.8*  MCV 97.4 97.3  PLT 60* 38*   Cardiac Enzymes: Recent Labs  Lab 12/17/17 1105 12/17/17 1759 12/18/17 0047 12/18/17 0424  CKTOTAL 898*  --   --   --   TROPONINI 0.90* 0.88* 0.65* 0.54*   CBG: Recent Labs  Lab 12/17/17 1113 12/17/17 2200 12/18/17 0737 12/18/17 1128 12/18/17 1602  GLUCAP 161* 133* 164* 180* 143*    Recent Results (from the past 240 hour(s))  Blood culture (routine x 2)     Status: None (Preliminary result)   Collection Time: 12/17/17  7:21 PM  Result Value Ref Range Status   Specimen Description BLOOD RIGHT ARM DRAWN BY RN  Final   Special Requests   Final    BOTTLES DRAWN AEROBIC AND ANAEROBIC Blood Culture adequate volume   Culture   Final    NO GROWTH < 24 HOURS Performed at [facility], [address] [place], [place] [address]  Report Status PENDING  Incomplete  Blood culture (routine x 2)     Status: None (Preliminary result)   Collection Time: 12/17/17  7:21 PM  Result Value Ref Range Status   Specimen Description BLOOD RIGHT WRIST DRAWN BY RN  Final   Special Requests   Final    BOTTLES DRAWN AEROBIC AND ANAEROBIC Blood Culture adequate volume   Culture   Final    NO GROWTH < 24 HOURS Performed at [facility], [address], [place], [place] [address]    Report Status PENDING  Incomplete  MRSA PCR Screening     Status: Abnormal   Collection Time: 12/17/17  8:08 PM  Result Value Ref Range Status   MRSA by PCR POSITIVE (A) NEGATIVE Final    Comment:        The GeneXpert MRSA Assay (FDA approved for NASAL specimens only), is one component of a comprehensive MRSA colonization surveillance program. It is not intended to diagnose MRSA infection nor to guide or monitor treatment  for MRSA infections. RESULT CALLED TO, READ BACK BY AND VERIFIED WITH: AMBURN,A. AT 1914 ON 12/18/2017 BY EVA Performed at [facility], [address] [place], [place] [address]      Studies: Ct Head Wo Contrast  Result Date: 12/17/2017 CLINICAL DATA:  Found down in his apartment. Laceration to left cheek. EXAM: CT HEAD WITHOUT CONTRAST CT MAXILLOFACIAL WITHOUT CONTRAST CT CERVICAL SPINE WITHOUT CONTRAST TECHNIQUE: Multidetector CT imaging of the head, cervical spine, and maxillofacial structures were performed using the standard protocol without intravenous contrast. Multiplanar CT image reconstructions of the cervical spine and maxillofacial structures were also generated. COMPARISON:  Cervical spine x-rays dated August 03, 2008. 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 No evidence of hemorrhage, hydrocephalus, extra-axial collection or mass lesion/mass effect. Mild to moderate generalized cerebral atrophy. Mild scattered periventricular and subcortical white matter hypodensities are nonspecific, but favored to reflect chronic microvascular ischemic changes. Vascular: Atherosclerotic vascular calcification of the carotid siphons. No hyperdense vessel. Skull: Normal. Negative for fracture or focal lesion. Other: Small left frontal scalp hematoma. CT MAXILLOFACIAL FINDINGS Osseous: No fracture or mandibular dislocation. No destructive process. Periapical lucencies involving the right maxillary first and second molars. Orbits: Negative. No traumatic or inflammatory finding. Sinuses: Chronic appearing mild-to-moderate moderate mucosal thickening of the paranasal sinuses, moderate in the left maxillary sinus. The mastoid air cells are clear. Soft tissues: Left cheek laceration. CT CERVICAL SPINE FINDINGS Alignment: Reversal of the normal cervical lordosis, centered at C5, unchanged. No traumatic malalignment. Skull base and vertebrae:  No acute cervical spine fracture. Mild anterior superior endplate height loss at T1 with associated Schmorl's node. No primary bone lesion or focal pathologic process. Soft tissues and spinal canal: Mild prevertebral soft tissue thickening. Subcutaneous stranding and fluid in the left neck. No visible canal hematoma. Disc levels: Moderate to severe disc height loss at C2-C3, C5-C6, and C6-C7, progressed when compared to prior study. Mild bilateral neuroforaminal stenosis at C5-C6 and C6-C7. Upper chest: Negative. Other: 2.9 cm superficial subcutaneous cystic lesion in the right posterior neck, likely a sebaceous cyst. IMPRESSION: 1. 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 2. No acute cervical spine fracture. Mild prevertebral soft tissue thickening and subcutaneous stranding/fluid in the left neck. Ligamentous and/or soft tissue injury is not excluded. Recommend MRI cervical spine for further evaluation. 3. Mild anterior superior endplate height loss at T1 with associated Schmorl's node is favored chronic. 4.  No acute facial fracture. 5. Small left frontal scalp hematoma.  Left cheek laceration. Electronically Signed   By: Obie Dredge M.D.   On: 12/17/2017 12:06   Ct Cervical Spine Wo Contrast  Result Date: 12/17/2017 CLINICAL DATA:  Found down in his apartment. Laceration to left cheek. EXAM: CT HEAD WITHOUT CONTRAST CT MAXILLOFACIAL WITHOUT CONTRAST CT CERVICAL SPINE WITHOUT CONTRAST TECHNIQUE: Multidetector CT imaging of the head, cervical spine, and maxillofacial structures were performed using the standard protocol without intravenous contrast. Multiplanar CT image reconstructions of the cervical spine and maxillofacial structures were also generated. COMPARISON:  Cervical spine x-rays dated August 03, 2008. 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 No evidence of hemorrhage, hydrocephalus,  extra-axial collection or mass lesion/mass effect. Mild to moderate generalized cerebral atrophy. Mild scattered periventricular and subcortical white matter hypodensities are nonspecific, but favored to reflect chronic microvascular ischemic changes. Vascular: Atherosclerotic vascular calcification of the carotid siphons. No hyperdense vessel. Skull: Normal. Negative for fracture or focal lesion. Other: Small left frontal scalp hematoma. CT MAXILLOFACIAL FINDINGS Osseous: No fracture or mandibular dislocation. No destructive process. Periapical lucencies involving the right maxillary first and second molars. Orbits: Negative. No traumatic or inflammatory finding. Sinuses: Chronic appearing mild-to-moderate moderate mucosal thickening of the paranasal sinuses, moderate in the left maxillary sinus. The mastoid air cells are clear. Soft tissues: Left cheek laceration. CT CERVICAL SPINE FINDINGS Alignment: Reversal of the normal cervical lordosis, centered at C5, unchanged. No traumatic malalignment. Skull base and vertebrae: No acute cervical spine fracture. Mild anterior superior endplate height loss at T1 with associated Schmorl's node. No primary bone lesion or focal pathologic process. Soft tissues and spinal canal: Mild prevertebral soft tissue thickening. Subcutaneous stranding and fluid in the left neck. No visible canal hematoma. Disc levels: Moderate to severe disc height loss at C2-C3, C5-C6, and C6-C7, progressed when compared to prior study. Mild bilateral neuroforaminal stenosis at C5-C6 and C6-C7. Upper chest: Negative. Other: 2.9 cm superficial subcutaneous cystic lesion in the right posterior neck, likely a sebaceous cyst. IMPRESSION: 1. 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 2. No acute cervical spine fracture. Mild prevertebral soft tissue thickening and subcutaneous stranding/fluid in the left neck. Ligamentous and/or soft tissue injury is not excluded.  Recommend MRI cervical spine for further evaluation. 3. Mild anterior superior endplate height loss at T1 with associated Schmorl's node is favored chronic. 4.  No acute facial fracture. 5. Small left frontal scalp hematoma.  Left cheek laceration. Electronically Signed   By: Obie Dredge M.D.   On: 12/17/2017 12:06   Mr Maxine Glenn Head Wo Contrast  Result Date: 12/18/2017 CLINICAL DATA:  Altered mental status EXAM: MRI HEAD WITHOUT CONTRAST MRA HEAD WITHOUT CONTRAST TECHNIQUE: Multiplanar, multiecho pulse sequences of the brain and surrounding structures were obtained without intravenous contrast. Angiographic images of the head were obtained using MRA technique without contrast. COMPARISON:  None. FINDINGS: MRI HEAD FINDINGS Brain: Multifocal abnormal diffusion restriction. The largest areas involve the left anterior and middle cerebral artery territories. There are smaller infarct scattered within the left deep gray structures, right occipital lobe, right temporal lobe, right midbrain and both cerebellar hemispheres. Extensive hyperintense T2-weighted signal corresponds to the areas of ischemia. There is no midline shift. No acute hemorrhage. No mass lesion. No chronic microhemorrhage or cerebral amyloid angiopathy. No hydrocephalus, age advanced atrophy or lobar predominant volume loss. No dural abnormality or extra-axial collection. Skull and upper cervical spine: The  visualized skull base, calvarium, upper cervical spine and extracranial soft tissues are normal. Sinuses/Orbits: Moderate left maxillary sinus mucosal disease. Small amount of bilateral mastoid fluid. Normal orbits. MRA HEAD FINDINGS Intracranial internal carotid arteries: Normal. Anterior cerebral arteries: The left anterior cerebral arteries occluded of the proximal A2 segment. Normal right ACA. Middle cerebral arteries: There is occlusion of the left middle cerebral artery inferior division. Superior division and M1 segment are normal. Right MCA  is normal. Posterior communicating arteries: Present bilaterally. Posterior cerebral arteries: Bilateral fetal origins.  Normal. Basilar artery: Normal. Vertebral arteries: Left dominant. Distal right vertebral artery appears occluded or severely stenotic. Superior cerebellar arteries: Normal. Anterior inferior cerebellar arteries: Normal. Posterior inferior cerebellar arteries: Nonvisualization of right PICA. Normal left. IMPRESSION: 1. 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 No acute hemorrhage or herniation. 2. Multiple small foci of acute ischemia within the left basal ganglia, left thalamus, right occipital lobe, right temporal lobe, right midbrain and bilaterally within the cerebellum. 3. 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 4. Occlusion of the proximal to midportion of the left anterior cerebral artery A2 segment. Electronically Signed   By: Deatra Robinson M.D.   On: 12/18/2017 17:49   Mr Brain Wo Contrast  Result Date: 12/18/2017 CLINICAL DATA:  Altered mental status EXAM: MRI HEAD WITHOUT CONTRAST MRA HEAD WITHOUT CONTRAST TECHNIQUE: Multiplanar, multiecho pulse sequences of the brain and surrounding structures were obtained without intravenous contrast. Angiographic images of the head were obtained using MRA technique without contrast. COMPARISON:  None. FINDINGS: MRI HEAD FINDINGS Brain: Multifocal abnormal diffusion restriction. The largest areas involve the left anterior and middle cerebral artery territories. There are smaller infarct scattered within the left deep gray structures, right occipital lobe, right temporal lobe, right midbrain and both cerebellar hemispheres. Extensive hyperintense T2-weighted signal corresponds to the areas of ischemia. There is no midline shift. No acute hemorrhage. No mass lesion. No chronic microhemorrhage or cerebral amyloid angiopathy. No  hydrocephalus, age advanced atrophy or lobar predominant volume loss. No dural abnormality or extra-axial collection. Skull and upper cervical spine: The visualized skull base, calvarium, upper cervical spine and extracranial soft tissues are normal. Sinuses/Orbits: Moderate left maxillary sinus mucosal disease. Small amount of bilateral mastoid fluid. Normal orbits. MRA HEAD FINDINGS Intracranial internal carotid arteries: Normal. Anterior cerebral arteries: The left anterior cerebral arteries occluded of the proximal A2 segment. Normal right ACA. Middle cerebral arteries: There is occlusion of the left middle cerebral artery inferior division. Superior division and M1 segment are normal. Right MCA is normal. Posterior communicating arteries: Present bilaterally. Posterior cerebral arteries: Bilateral fetal origins.  Normal. Basilar artery: Normal. Vertebral arteries: Left dominant. Distal right vertebral artery appears occluded or severely stenotic. Superior cerebellar arteries: Normal. Anterior inferior cerebellar arteries: Normal. Posterior inferior cerebellar arteries: Nonvisualization of right PICA. Normal left. IMPRESSION: 1. 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 No acute hemorrhage or herniation. 2. Multiple small foci of acute ischemia within the left basal ganglia, left thalamus, right occipital lobe, right temporal lobe, right midbrain and bilaterally within the cerebellum. 3. 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 4. Occlusion of the proximal to midportion of the left anterior cerebral artery A2 segment. Electronically Signed   By: Deatra Robinson M.D.   On: 12/18/2017 17:49   Mr Cervical Spine Wo Contrast  Addendum Date: 12/18/2017  ADDENDUM REPORT: 12/18/2017 12:32 ADDENDUM: Old cerebellar and pontine infarcts with abnormal appearance of the distal right vertebral artery which may  reflect slow flow or occlusion. Electronically Signed   By: Sebastian Ache M.D.   On: 12/18/2017 12:32   Result Date: 12/18/2017 CLINICAL DATA:  Found down on floor unresponsive. Prevertebral swelling on CT. EXAM: MRI CERVICAL SPINE WITHOUT CONTRAST TECHNIQUE: Multiplanar, multisequence MR imaging of the cervical spine was performed. No intravenous contrast was administered. COMPARISON:  Cervical spine CT 12/17/2017 FINDINGS: Alignment: Mild reversal the normal cervical lordosis. Trace retrolisthesis of C2 on C3, C5 on C6, and C6 on C7. Vertebrae: T1 superior endplate Schmorl's node without marrow edema. Advanced disc degeneration at C5-6 and C6-7 with T2/stir hyperintensity diffusely throughout both disc spaces and mild endplate edema. Cord: Normal signal. Posterior Fossa, vertebral arteries, paraspinal tissues: Prevertebral fluid extends from C2-C7 and measures up to 1 cm in AP thickness at the C3-4 level. Edema in the more superficial soft tissues of the left lateral neck was more fully imaged on the earlier CT and may favor a posttraumatic etiology for the prevertebral fluid. No discrete anterior longitudinal ligament disruption is identified. There is no soft tissue edema suggestive of posterior ligamentous complex injury. 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 Preserved vertebral artery flow voids in the neck. Abnormal appearance of the right V4 segment. 2.9 cm T2 hyperintense subcutaneous lesion in the posterior right lower neck, likely a sebaceous cyst. Disc levels: C2-3: Severe disc space narrowing. Disc bulging, uncovertebral spurring, infolding of the ligamentum flavum, and moderate right facet arthrosis result in moderate spinal stenosis and mild-to-moderate right and severe left neural foraminal stenosis. C3-4: Small to moderate-sized central disc extrusion with mild superior migration and infolding of the ligamentum flavum result in moderate spinal  stenosis. No significant neural foraminal stenosis. C4-5: Broad posterior disc protrusion results in mild spinal stenosis. Mild bilateral neural foraminal stenosis. C5-6: Severe disc space narrowing. Broad-based posterior disc osteophyte complex results in mild-to-moderate spinal stenosis and severe bilateral neural foraminal stenosis. C6-7: Severe disc space narrowing. Broad-based posterior disc osteophyte complex results in mild-to-moderate spinal stenosis and severe bilateral neural foraminal stenosis. C7-T1: Minimal disc bulging and mild facet arthrosis without stenosis. IMPRESSION: 1. Diffuse cervical prevertebral fluid/edema. This could reflect posttraumatic effusion and soft tissue injury versus early infectious discitis at C5-6 and C6-7. 2. 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 Electronically Signed: By: Sebastian Ache M.D. On: 12/18/2017 11:44   Portable Chest X-ray 1 View  Result Date: 12/18/2017 CLINICAL DATA:  Sepsis, AFib EXAM: PORTABLE CHEST 1 VIEW COMPARISON:  12/17/2017 FINDINGS: Mild lingular and bilateral lower lobe opacities, likely atelectasis, although pneumonia remains possible. No frank interstitial edema.  No pleural effusion or pneumothorax. The heart is normal in size. IMPRESSION: Mild lingular and bilateral lower lobe opacities, likely atelectasis, although pneumonia remains possible. Electronically Signed   By: Charline Bills M.D.   On: 12/18/2017 07:35   Dg Chest Port 1 View  Result Date: 12/17/2017 CLINICAL DATA:  Chest pain, altered mental status EXAM: PORTABLE CHEST 1 VIEW COMPARISON:  Chest x-ray of 05/18/2017 FINDINGS: No active infiltrate or effusion is seen. Mediastinal and hilar contours are unremarkable. The heart is borderline enlarged and stable. No bony abnormality is seen. IMPRESSION: No active lung disease.  Heart upper normal in size. Electronically Signed   By: Dwyane Dee  M.D.   On: 12/17/2017  11:56   Dg Chest Port 1v Same Day  Result Date: 12/17/2017 CLINICAL DATA:  72 year old male with code sepsis, and evolving lung sounds EXAM: PORTABLE CHEST 1 VIEW COMPARISON:  Chest x-ray obtained earlier today at 11:40 a.m. FINDINGS: Significantly lower inspiratory volumes with developing lingular and left lower lobe streaky airspace opacities favored to reflect atelectasis. No overt pulmonary edema, pneumothorax or pleural effusion. Stable cardiac and mediastinal contours. No acute osseous abnormality. IMPRESSION: 1. Lower inspiratory volumes with developing streaky lingular and left lower lobe airspace opacities favored to reflect atelectasis. Electronically Signed   By: Malachy Moan M.D.   On: 12/17/2017 18:08   Ct Maxillofacial Wo Cm  Result Date: 12/17/2017 CLINICAL DATA:  Found down in his apartment. Laceration to left cheek. EXAM: CT HEAD WITHOUT CONTRAST CT MAXILLOFACIAL WITHOUT CONTRAST CT CERVICAL SPINE WITHOUT CONTRAST TECHNIQUE: Multidetector CT imaging of the head, cervical spine, and maxillofacial structures were performed using the standard protocol without intravenous contrast. Multiplanar CT image reconstructions of the cervical spine and maxillofacial structures were also generated. COMPARISON:  Cervical spine x-rays dated August 03, 2008. 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 No evidence of hemorrhage, hydrocephalus, extra-axial collection or mass lesion/mass effect. Mild to moderate generalized cerebral atrophy. Mild scattered periventricular and subcortical white matter hypodensities are nonspecific, but favored to reflect chronic microvascular ischemic changes. Vascular: Atherosclerotic vascular calcification of the carotid siphons. No hyperdense vessel. Skull: Normal. Negative for fracture or focal lesion. Other: Small left frontal scalp hematoma. CT MAXILLOFACIAL FINDINGS Osseous: No  fracture or mandibular dislocation. No destructive process. Periapical lucencies involving the right maxillary first and second molars. Orbits: Negative. No traumatic or inflammatory finding. Sinuses: Chronic appearing mild-to-moderate moderate mucosal thickening of the paranasal sinuses, moderate in the left maxillary sinus. The mastoid air cells are clear. Soft tissues: Left cheek laceration. CT CERVICAL SPINE FINDINGS Alignment: Reversal of the normal cervical lordosis, centered at C5, unchanged. No traumatic malalignment. Skull base and vertebrae: No acute cervical spine fracture. Mild anterior superior endplate height loss at T1 with associated Schmorl's node. No primary bone lesion or focal pathologic process. Soft tissues and spinal canal: Mild prevertebral soft tissue thickening. Subcutaneous stranding and fluid in the left neck. No visible canal hematoma. Disc levels: Moderate to severe disc height loss at C2-C3, C5-C6, and C6-C7, progressed when compared to prior study. Mild bilateral neuroforaminal stenosis at C5-C6 and C6-C7. Upper chest: Negative. Other: 2.9 cm superficial subcutaneous cystic lesion in the right posterior neck, likely a sebaceous cyst. IMPRESSION: 1. 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 2. No acute cervical spine fracture. Mild prevertebral soft tissue thickening and subcutaneous stranding/fluid in the left neck. Ligamentous and/or soft tissue injury is not excluded. Recommend MRI cervical spine for further evaluation. 3. Mild anterior superior endplate height loss at T1 with associated Schmorl's node is favored chronic. 4.  No acute facial fracture. 5. Small left frontal scalp hematoma.  Left cheek laceration. Electronically Signed   By: Obie Dredge M.D.   On: 12/17/2017 12:06    Scheduled Meds: . Chlorhexidine Gluconate Cloth  6 each Topical Q0600  . insulin aspart  0-15 Units Subcutaneous TID WC  . levalbuterol  0.63 mg Nebulization  Q6H  . mouth rinse  15 mL Mouth Rinse BID  . methylPREDNISolone (SOLU-MEDROL) injection  40 mg Intravenous Q12H  . metoprolol tartrate  2.5 mg Intravenous Q8H  . mupirocin ointment  1 application Nasal BID  .  mupirocin ointment   Topical Daily  . sodium chloride flush  3 mL Intravenous Q12H   Continuous Infusions: . ampicillin-sulbactam (UNASYN) IV Stopped (12/18/17 1645)     Time spent: 40 minutes (> 50% of the time dedicated to face to face examination, coordination of care; discussion with consultants and update at bedside to family about findings, work up planned and current treatment).Vassie Loll  [facility] Pager [phone]. If 7PM-7AM, please contact night-coverage at [email or web address], password [facility] 12/18/2017, 6:56 PM  LOS: 1 day

## 2017-12-19 ENCOUNTER — Inpatient Hospital Stay (HOSPITAL_COMMUNITY)
Admit: 2017-12-19 | Discharge: 2017-12-19 | Disposition: A | Discharge status: Home / Self Care | Payer: Medicare Other | Attending: Cardiovascular Disease | Admitting: Cardiovascular Disease

## 2017-12-19 DIAGNOSIS — D696 Thrombocytopenia, unspecified: Secondary | ICD-10-CM

## 2017-12-19 DIAGNOSIS — A419 Sepsis, unspecified organism: Principal | ICD-10-CM

## 2017-12-19 DIAGNOSIS — I639 Cerebral infarction, unspecified: Secondary | ICD-10-CM

## 2017-12-19 DIAGNOSIS — I481 Persistent atrial fibrillation: Secondary | ICD-10-CM

## 2017-12-19 DIAGNOSIS — G9340 Encephalopathy, unspecified: Secondary | ICD-10-CM

## 2017-12-19 DIAGNOSIS — Z7189 Other specified counseling: Secondary | ICD-10-CM

## 2017-12-19 DIAGNOSIS — A4159 Other Gram-negative sepsis: Secondary | ICD-10-CM

## 2017-12-19 DIAGNOSIS — A414 Sepsis due to anaerobes: Secondary | ICD-10-CM

## 2017-12-19 DIAGNOSIS — Z515 Encounter for palliative care: Secondary | ICD-10-CM

## 2017-12-19 DIAGNOSIS — R234 Changes in skin texture: Secondary | ICD-10-CM

## 2017-12-19 DIAGNOSIS — N179 Acute kidney failure, unspecified: Secondary | ICD-10-CM

## 2017-12-19 DIAGNOSIS — N183 Chronic kidney disease, stage 3 (moderate): Secondary | ICD-10-CM

## 2017-12-19 DIAGNOSIS — G9341 Metabolic encephalopathy: Secondary | ICD-10-CM

## 2017-12-19 DIAGNOSIS — T796XXD Traumatic ischemia of muscle, subsequent encounter: Secondary | ICD-10-CM

## 2017-12-19 LAB — GLUCOSE, CAPILLARY
GLUCOSE-CAPILLARY: 224 mg/dL — AB (ref 65–99)
Glucose-Capillary: 232 mg/dL — ABNORMAL HIGH (ref 65–99)
Glucose-Capillary: 226 mg/dL — ABNORMAL HIGH (ref 65–99)
Glucose-Capillary: 241 mg/dL — ABNORMAL HIGH (ref 65–99)

## 2017-12-19 LAB — BASIC METABOLIC PANEL
ANION GAP: 12 (ref 5–15)
BUN: 92 mg/dL — AB (ref 6–20)
CALCIUM: 8.3 mg/dL — AB (ref 8.9–10.3)
CO2: 19 mmol/L — AB (ref 22–32)
CREATININE: 2.15 mg/dL — AB (ref 0.61–1.24)
Chloride: 121 mmol/L — ABNORMAL HIGH (ref 101–111)
GFR calc Af Amer: 34 mL/min — ABNORMAL LOW (ref >60)
GFR calc non Af Amer: 29 mL/min — ABNORMAL LOW (ref >60)
GLUCOSE: 249 mg/dL — AB (ref 65–99)
Potassium: 4.4 mmol/L (ref 3.5–5.1)
Sodium: 152 mmol/L — ABNORMAL HIGH (ref 135–145)

## 2017-12-19 LAB — CBC
HEMATOCRIT: 33.3 % — AB (ref 39.0–52.0)
Hemoglobin: 10.8 g/dL — ABNORMAL LOW (ref 13.0–17.0)
MCH: 31.6 pg (ref 26.0–34.0)
MCHC: 32.4 g/dL (ref 30.0–36.0)
MCV: 97.4 fL (ref 78.0–100.0)
Platelets: 42 10*3/uL — ABNORMAL LOW (ref 150–400)
RBC: 3.42 MIL/uL — ABNORMAL LOW (ref 4.22–5.81)
RDW: 14.3 % (ref 11.5–15.5)
WBC: 28.2 10*3/uL — ABNORMAL HIGH (ref 4.0–10.5)

## 2017-12-19 LAB — PATHOLOGIST SMEAR REVIEW

## 2017-12-19 MED ORDER — DEXTROSE-NACL 5-0.45 % IV SOLN
INTRAVENOUS | Status: DC
  Administered 2017-12-19: 16:00:00 via INTRAVENOUS

## 2017-12-19 MED ORDER — SODIUM CHLORIDE 0.9 % IV SOLN
500.0000 mg | Freq: Two times a day (BID) | INTRAVENOUS | Status: DC
  Administered 2017-12-19 (×2): 500 mg via INTRAVENOUS
  Filled 2017-12-19 (×3): qty 0.5

## 2017-12-19 MED ORDER — LEVALBUTEROL HCL 0.63 MG/3ML IN NEBU
0.6300 mg | INHALATION_SOLUTION | Freq: Three times a day (TID) | RESPIRATORY_TRACT | Status: DC
  Administered 2017-12-20 – 2017-12-22 (×7): 0.63 mg via RESPIRATORY_TRACT
  Filled 2017-12-19 (×7): qty 3

## 2017-12-19 MED ORDER — INSULIN ASPART 100 UNIT/ML ~~LOC~~ SOLN
0.0000 [IU] | Freq: Three times a day (TID) | SUBCUTANEOUS | Status: DC
  Administered 2017-12-19: 3 [IU] via SUBCUTANEOUS

## 2017-12-19 NOTE — Progress Notes (Signed)
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  Neighbors had not seen him since 12/13/17 and the police was present at his apartment for another reason and the apartment staff decided to check on the patient.  They found him down on the floor unresponsive covered in urine.  Brought into the emergency department where he was noted to be in atrial fibrillation with rapid ventricular response.  He was given multiple medications including diltiazem and Lopressor to improve his heart rate.  Ultimately was started on a diltiazem drip.  After that his blood pressure dropped when he spiked a temperature to 1.9 in the emergency department.  Blood pressure had been in the 180s systolic and then dropped to the 78/47 range.  Received 4 L of IV fluid and his blood pressure has improved to the mid to low 100s currently 107/62.  The patient was found to have markedly infected urine, and elevated troponin, elevated blood glucoses, and elevated bilirubin, and evaded CPK.  The patient was given vancomycin and cefepime in the emergency department.  Chest x-ray showed mild lingular and bilateral lower lobe opacities.  He was given adenosine 6 mg and subsequently 12 mg.  He was ultimately started on diltiazem drip.  Cardiology was consulted for elevated troponins.  They felt this to be due to demand ischemia.    Assessment/Plan: Sepsis -Secondary to UTI and aspiration pneumonia -Lactic acid peaked at 6.1 -Procalcitonin to 46.76 -Given elevated WBC and concern for possible ESBL Klebsiella oxytoca--> plan antibiotic coverage to meropenem pending final culture data  Acute metabolic encephalopathy -Multifactorial including  sepsis, stroke, acute on chronic renal failure, hypernatremia -Patient remains very somnolent  UTI -Meropenem pending final culture data  Aspiration pneumonia -Start meropenem  Acute embolic stroke -08/21/9840 MRI brain--large acute left ACA territory infarct and a left MCA infarct; multiple foci of acute ischemia in the left basal ganglia, left thalamus, right occipital lobe, right temporal lobe, right midbrain, and bilateral cerebellum -Neurology consult -Carotid duplex -12/18/2017 echo EF 44-45%, grade 1 DD, mild MR  Atrial fibrillation with RVR -Patient has converted to sinus rhythm -Continue IV metoprolol -Currently a poor candidate for anticoagulation, but defer to cardiology and neurology  Rhabdomyolysis, traumatic -CPK peaked at 898 -Repeat CPK  Thrombocytopenia -Secondary to sepsis -Holding Lovenox and heparin -Serum B12 -TSH 1.211 -PTT 29 -Check INR -Check fibrinogen  Acute on chronic renal failure--CKD stage III -Secondary to volume depletion, sepsis, and rhabdomyolysis -Baseline creatinine 1.2-1.4 -Serum creatinine peaked to 2.58  Elevated troponin -Appreciate cardiology consult -Felt to be due to demand ischemia  Neck contusion -MRI cervical spine--negative for fracture.  Cervical prevertebral edema more in the superficial soft tissues of the left lateral neck favoring posttraumatic etiology; no soft tissue edema suggestive of ligamentous injury   Goals of Care -appreciate palliative care consult -overall poor prognosis with low likelihood of recover to pre-morbid condition  The patient is critically ill with multiple organ systems failure and requires high complexity decision making for assessment and support, frequent evaluation and titration of therapies, application of advanced monitoring technologies and extensive interpretation of multiple databases.  Critical care time - 45 mins.      Disposition Plan:  SNF when stable Family Communication:    Daughter updated  Consultants:  Palliative medicine  Code Status:  FULL   DVT Prophylaxis: SCDs   Procedures: As Listed in Progress Note Above  Antibiotics: vanco 5/6 Cefepime 5/6 azithro 5/6 Unasyn 5/7>>>5/8 merrem 5/8>>>      Subjective: Patient intermittently opens eyes.  Does not follow commands.  He grimaces to perihepatic stimuli.  There is no reports of respiratory distress, vomiting, diarrhea, uncontrolled pain.  Objective: Vitals:   12/19/17 0800 12/19/17 0801 12/19/17 1000 12/19/17 1200  BP: 120/76  116/72 108/77  Pulse: 100  96 (!) 103  Resp: 16  16 (!) 21  Temp: 97.9 F (36.6 C)   98 F (36.7 C)  TempSrc: Axillary   Axillary  SpO2:  99% 99% 100%  Weight:      Height:        Intake/Output Summary (Last 24 hours) at 12/19/2017 1318 Last data filed at 12/19/2017 1034 Gross per 24 hour  Intake 356 ml  Output 1800 ml  Net -1444 ml   Weight change: -3.653 kg (-8 lb 0.8 oz) Exam:   General:  Pt is alert, follows commands appropriately, not in acute distress  HEENT: No icterus, No thrush, No neck mass, [place]/AT  Cardiovascular: RRR, S1/S2, no rubs, no gallops  Respiratory: Bilateral scattered rhonchi.  No wheezing.  Good air movement.  Abdomen: Soft/+BS, non tender, non distended, no guarding  Extremities: No edema, No lymphangitis, No petechiae, No rashes, no synovitis   Data Reviewed: I have personally reviewed following labs and imaging studies Basic Metabolic Panel: Recent Labs  Lab 12/17/17 1105 12/17/17 1146 12/18/17 0424 12/19/17 0421  NA 142  --  146* 152*  K 4.7  --  4.6 4.4  CL 105  --  117* 121*  CO2 22  --  18* 19*  GLUCOSE 172*  --  217* 249*  BUN 105*  --  98* 92*  CREATININE 2.44*  --  2.58* 2.15*  CALCIUM 9.1  --  7.7* 8.3*  MG  --  2.8*  --   --    Liver Function Tests: Recent Labs  Lab 12/17/17 1105 12/18/17 0424  AST 74* 71*  ALT 30 23  ALKPHOS 45 35*  BILITOT 3.5* 3.8*  PROT 8.4* 6.6  ALBUMIN 3.0* 2.2*    No results for input(s): LIPASE, AMYLASE in the last 168 hours. No results for input(s): AMMONIA in the last 168 hours. Coagulation Profile: No results for input(s): INR, PROTIME in the last 168 hours. CBC: Recent Labs  Lab 12/17/17 1105 12/18/17 0424 12/19/17 0421  WBC 6.1 26.6* 28.2*  HGB 13.8 11.0* 10.8*  HCT 41.4 32.8* 33.3*  MCV 97.4 97.3 97.4  PLT 60* 38* 42*   Cardiac Enzymes: Recent Labs  Lab 12/17/17 1105 12/17/17 1759 12/18/17 0047 12/18/17 0424  CKTOTAL 898*  --   --   --   TROPONINI 0.90* 0.88* 0.65* 0.54*   BNP: Invalid input(s): POCBNP CBG: Recent Labs  Lab 12/18/17 1128 12/18/17 1602 12/18/17 2224 12/19/17 0758 12/19/17 1202  GLUCAP 180* 143* 174* 232* 224*   HbA1C: Recent Labs    12/17/17 1759  HGBA1C 5.8*   Urine analysis:    Component Value Date/Time   COLORURINE AMBER (A) 12/17/2017 1105   APPEARANCEUR CLOUDY (A) 12/17/2017 1105   LABSPEC 1.015 12/17/2017 1105   PHURINE 5.0 12/17/2017 1105   GLUCOSEU NEGATIVE 12/17/2017 1105   HGBUR LARGE (A) 12/17/2017 1105   BILIRUBINUR NEGATIVE 12/17/2017  1105   KETONESUR 5 (A) 12/17/2017 1105   PROTEINUR 30 (A) 12/17/2017 1105   NITRITE NEGATIVE 12/17/2017 1105   LEUKOCYTESUR MODERATE (A) 12/17/2017 1105   Sepsis Labs: @LABRCNTIP (procalcitonin:4,lacticidven:4) ) Recent Results (from the past 240 hour(s))  Urine culture     Status: Abnormal (Preliminary result)   Collection Time: 12/17/17 11:05 AM  Result Value Ref Range Status   Specimen Description   Final    URINE, RANDOM Performed at [facility], [address], [place], [place] [address]    Special Requests   Final    NONE Performed at [facility], [address] [place], [place] [address]    Culture >=100,000 COLONIES/mL KLEBSIELLA OXYTOCA (A)  Final   Report Status PENDING  Incomplete  Blood culture (routine x 2)     Status: None (Preliminary result)   Collection Time: 12/17/17  7:21 PM  Result Value Ref Range Status    Specimen Description BLOOD RIGHT ARM DRAWN BY RN  Final   Special Requests   Final    BOTTLES DRAWN AEROBIC AND ANAEROBIC Blood Culture adequate volume   Culture   Final    NO GROWTH 2 DAYS Performed at [facility], [address] [place], [place] [address]    Report Status PENDING  Incomplete  Blood culture (routine x 2)     Status: None (Preliminary result)   Collection Time: 12/17/17  7:21 PM  Result Value Ref Range Status   Specimen Description BLOOD RIGHT WRIST DRAWN BY RN  Final   Special Requests   Final    BOTTLES DRAWN AEROBIC AND ANAEROBIC Blood Culture adequate volume   Culture   Final    NO GROWTH 2 DAYS Performed at [facility], [address], [place], [place] [address]    Report Status PENDING  Incomplete  MRSA PCR Screening     Status: Abnormal   Collection Time: 12/17/17  8:08 PM  Result Value Ref Range Status   MRSA by PCR POSITIVE (A) NEGATIVE Final    Comment:        The GeneXpert MRSA Assay (FDA approved for NASAL specimens only), is one component of a comprehensive MRSA colonization surveillance program. It is not intended to diagnose MRSA infection nor to guide or monitor treatment for MRSA infections. RESULT CALLED TO, READ BACK BY AND VERIFIED WITH: AMBURN,A. AT 6578 ON 12/18/2017 BY EVA Performed at [facility], [address], [place], [place] [address]      Scheduled Meds: . Chlorhexidine Gluconate Cloth  6 each Topical Q0600  . insulin aspart  0-15 Units Subcutaneous TID WC  . levalbuterol  0.63 mg Nebulization Q6H  . mouth rinse  15 mL Mouth Rinse BID  . methylPREDNISolone (SOLU-MEDROL) injection  40 mg Intravenous Q12H  . metoprolol tartrate  2.5 mg Intravenous Q8H  . mupirocin ointment  1 application Nasal BID  . mupirocin ointment   Topical Daily  . sodium chloride flush  3 mL Intravenous Q12H   Continuous Infusions: . ampicillin-sulbactam (UNASYN) IV Stopped (12/19/17 9528)  . famotidine (PEPCID) IV Stopped (12/18/17 2206)     Procedures/Studies: Ct Head Wo Contrast  Result Date: 12/17/2017 CLINICAL DATA:  Found down in his apartment. Laceration to left cheek. EXAM: CT HEAD WITHOUT CONTRAST CT MAXILLOFACIAL WITHOUT CONTRAST CT CERVICAL SPINE WITHOUT CONTRAST TECHNIQUE: Multidetector CT imaging of the head, cervical spine, and maxillofacial structures were performed using the standard protocol without intravenous contrast. Multiplanar CT image reconstructions of the cervical spine and maxillofacial structures  were also generated. COMPARISON:  Cervical spine x-rays dated August 03, 2008. 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 No evidence of hemorrhage, hydrocephalus, extra-axial collection or mass lesion/mass effect. Mild to moderate generalized cerebral atrophy. Mild scattered periventricular and subcortical white matter hypodensities are nonspecific, but favored to reflect chronic microvascular ischemic changes. Vascular: Atherosclerotic vascular calcification of the carotid siphons. No hyperdense vessel. Skull: Normal. Negative for fracture or focal lesion. Other: Small left frontal scalp hematoma. CT MAXILLOFACIAL FINDINGS Osseous: No fracture or mandibular dislocation. No destructive process. Periapical lucencies involving the right maxillary first and second molars. Orbits: Negative. No traumatic or inflammatory finding. Sinuses: Chronic appearing mild-to-moderate moderate mucosal thickening of the paranasal sinuses, moderate in the left maxillary sinus. The mastoid air cells are clear. Soft tissues: Left cheek laceration. CT CERVICAL SPINE FINDINGS Alignment: Reversal of the normal cervical lordosis, centered at C5, unchanged. No traumatic malalignment. Skull base and vertebrae: No acute cervical spine fracture. Mild anterior superior endplate height loss at T1 with associated Schmorl's node. No primary bone lesion or focal pathologic process. Soft  tissues and spinal canal: Mild prevertebral soft tissue thickening. Subcutaneous stranding and fluid in the left neck. No visible canal hematoma. Disc levels: Moderate to severe disc height loss at C2-C3, C5-C6, and C6-C7, progressed when compared to prior study. Mild bilateral neuroforaminal stenosis at C5-C6 and C6-C7. Upper chest: Negative. Other: 2.9 cm superficial subcutaneous cystic lesion in the right posterior neck, likely a sebaceous cyst. IMPRESSION: 1. 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 2. No acute cervical spine fracture. Mild prevertebral soft tissue thickening and subcutaneous stranding/fluid in the left neck. Ligamentous and/or soft tissue injury is not excluded. Recommend MRI cervical spine for further evaluation. 3. Mild anterior superior endplate height loss at T1 with associated Schmorl's node is favored chronic. 4.  No acute facial fracture. 5. Small left frontal scalp hematoma.  Left cheek laceration. Electronically Signed   By: Obie Dredge M.D.   On: 12/17/2017 12:06   Ct Cervical Spine Wo Contrast  Result Date: 12/17/2017 CLINICAL DATA:  Found down in his apartment. Laceration to left cheek. EXAM: CT HEAD WITHOUT CONTRAST CT MAXILLOFACIAL WITHOUT CONTRAST CT CERVICAL SPINE WITHOUT CONTRAST TECHNIQUE: Multidetector CT imaging of the head, cervical spine, and maxillofacial structures were performed using the standard protocol without intravenous contrast. Multiplanar CT image reconstructions of the cervical spine and maxillofacial structures were also generated. COMPARISON:  Cervical spine x-rays dated August 03, 2008. 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 No evidence of hemorrhage, hydrocephalus, extra-axial collection or mass lesion/mass effect. Mild to moderate generalized cerebral atrophy. Mild scattered periventricular and subcortical white matter hypodensities are  nonspecific, but favored to reflect chronic microvascular ischemic changes. Vascular: Atherosclerotic vascular calcification of the carotid siphons. No hyperdense vessel. Skull: Normal. Negative for fracture or focal lesion. Other: Small left frontal scalp hematoma. CT MAXILLOFACIAL FINDINGS Osseous: No fracture or mandibular dislocation. No destructive process. Periapical lucencies involving the right maxillary first and second molars. Orbits: Negative. No traumatic or inflammatory finding. Sinuses: Chronic appearing mild-to-moderate moderate mucosal thickening of the paranasal sinuses, moderate in the left maxillary sinus. The mastoid air cells are clear. Soft tissues: Left cheek laceration. CT CERVICAL SPINE FINDINGS Alignment: Reversal of the normal cervical lordosis, centered at C5, unchanged. No traumatic malalignment. Skull base and vertebrae: No acute cervical spine fracture. Mild anterior superior endplate height loss at T1  with associated Schmorl's node. No primary bone lesion or focal pathologic process. Soft tissues and spinal canal: Mild prevertebral soft tissue thickening. Subcutaneous stranding and fluid in the left neck. No visible canal hematoma. Disc levels: Moderate to severe disc height loss at C2-C3, C5-C6, and C6-C7, progressed when compared to prior study. Mild bilateral neuroforaminal stenosis at C5-C6 and C6-C7. Upper chest: Negative. Other: 2.9 cm superficial subcutaneous cystic lesion in the right posterior neck, likely a sebaceous cyst. IMPRESSION: 1. 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 2. No acute cervical spine fracture. Mild prevertebral soft tissue thickening and subcutaneous stranding/fluid in the left neck. Ligamentous and/or soft tissue injury is not excluded. Recommend MRI cervical spine for further evaluation. 3. Mild anterior superior endplate height loss at T1 with associated Schmorl's node is favored chronic. 4.  No acute facial  fracture. 5. Small left frontal scalp hematoma.  Left cheek laceration. Electronically Signed   By: Obie Dredge M.D.   On: 12/17/2017 12:06   Mr Maxine Glenn Head Wo Contrast  Result Date: 12/18/2017 CLINICAL DATA:  Altered mental status EXAM: MRI HEAD WITHOUT CONTRAST MRA HEAD WITHOUT CONTRAST TECHNIQUE: Multiplanar, multiecho pulse sequences of the brain and surrounding structures were obtained without intravenous contrast. Angiographic images of the head were obtained using MRA technique without contrast. COMPARISON:  None. FINDINGS: MRI HEAD FINDINGS Brain: Multifocal abnormal diffusion restriction. The largest areas involve the left anterior and middle cerebral artery territories. There are smaller infarct scattered within the left deep gray structures, right occipital lobe, right temporal lobe, right midbrain and both cerebellar hemispheres. Extensive hyperintense T2-weighted signal corresponds to the areas of ischemia. There is no midline shift. No acute hemorrhage. No mass lesion. No chronic microhemorrhage or cerebral amyloid angiopathy. No hydrocephalus, age advanced atrophy or lobar predominant volume loss. No dural abnormality or extra-axial collection. Skull and upper cervical spine: The visualized skull base, calvarium, upper cervical spine and extracranial soft tissues are normal. Sinuses/Orbits: Moderate left maxillary sinus mucosal disease. Small amount of bilateral mastoid fluid. Normal orbits. MRA HEAD FINDINGS Intracranial internal carotid arteries: Normal. Anterior cerebral arteries: The left anterior cerebral arteries occluded of the proximal A2 segment. Normal right ACA. Middle cerebral arteries: There is occlusion of the left middle cerebral artery inferior division. Superior division and M1 segment are normal. Right MCA is normal. Posterior communicating arteries: Present bilaterally. Posterior cerebral arteries: Bilateral fetal origins.  Normal. Basilar artery: Normal. Vertebral arteries:  Left dominant. Distal right vertebral artery appears occluded or severely stenotic. Superior cerebellar arteries: Normal. Anterior inferior cerebellar arteries: Normal. Posterior inferior cerebellar arteries: Nonvisualization of right PICA. Normal left. IMPRESSION: 1. 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 No acute hemorrhage or herniation. 2. Multiple small foci of acute ischemia within the left basal ganglia, left thalamus, right occipital lobe, right temporal lobe, right midbrain and bilaterally within the cerebellum. 3. 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 4. Occlusion of the proximal to midportion of the left anterior cerebral artery A2 segment. Electronically Signed   By: Deatra Robinson M.D.   On: 12/18/2017 17:49   Mr Brain Wo Contrast  Result Date: 12/18/2017 CLINICAL DATA:  Altered mental status EXAM: MRI HEAD WITHOUT CONTRAST MRA HEAD WITHOUT CONTRAST TECHNIQUE: Multiplanar, multiecho pulse sequences of the brain and surrounding structures were obtained without intravenous contrast. Angiographic images of the head were obtained using MRA technique without contrast. COMPARISON:  None. FINDINGS: MRI  HEAD FINDINGS Brain: Multifocal abnormal diffusion restriction. The largest areas involve the left anterior and middle cerebral artery territories. There are smaller infarct scattered within the left deep gray structures, right occipital lobe, right temporal lobe, right midbrain and both cerebellar hemispheres. Extensive hyperintense T2-weighted signal corresponds to the areas of ischemia. There is no midline shift. No acute hemorrhage. No mass lesion. No chronic microhemorrhage or cerebral amyloid angiopathy. No hydrocephalus, age advanced atrophy or lobar predominant volume loss. No dural abnormality or extra-axial collection. Skull and upper cervical spine: The visualized skull base,  calvarium, upper cervical spine and extracranial soft tissues are normal. Sinuses/Orbits: Moderate left maxillary sinus mucosal disease. Small amount of bilateral mastoid fluid. Normal orbits. MRA HEAD FINDINGS Intracranial internal carotid arteries: Normal. Anterior cerebral arteries: The left anterior cerebral arteries occluded of the proximal A2 segment. Normal right ACA. Middle cerebral arteries: There is occlusion of the left middle cerebral artery inferior division. Superior division and M1 segment are normal. Right MCA is normal. Posterior communicating arteries: Present bilaterally. Posterior cerebral arteries: Bilateral fetal origins.  Normal. Basilar artery: Normal. Vertebral arteries: Left dominant. Distal right vertebral artery appears occluded or severely stenotic. Superior cerebellar arteries: Normal. Anterior inferior cerebellar arteries: Normal. Posterior inferior cerebellar arteries: Nonvisualization of right PICA. Normal left. IMPRESSION: 1. 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 No acute hemorrhage or herniation. 2. Multiple small foci of acute ischemia within the left basal ganglia, left thalamus, right occipital lobe, right temporal lobe, right midbrain and bilaterally within the cerebellum. 3. 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 4. Occlusion of the proximal to midportion of the left anterior cerebral artery A2 segment. Electronically Signed   By: Deatra Robinson M.D.   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 Electronically Signed   By: Sebastian Ache M.D.   On: 12/18/2017 12:32   Result Date: 12/18/2017 CLINICAL DATA:  Found down on floor unresponsive.  Prevertebral swelling on CT. EXAM: MRI CERVICAL SPINE WITHOUT CONTRAST TECHNIQUE: Multiplanar, multisequence MR imaging of the cervical spine was performed. No intravenous contrast was administered. COMPARISON:  Cervical spine CT 12/17/2017 FINDINGS: Alignment: Mild reversal the normal cervical lordosis. Trace retrolisthesis of C2 on C3, C5 on C6, and C6 on C7. Vertebrae: T1 superior endplate Schmorl's node without marrow edema. Advanced disc degeneration at C5-6 and C6-7 with T2/stir hyperintensity diffusely throughout both disc spaces and mild endplate edema. Cord: Normal signal. Posterior Fossa, vertebral arteries, paraspinal tissues: Prevertebral fluid extends from C2-C7 and measures up to 1 cm in AP thickness at the C3-4 level. Edema in the more superficial soft tissues of the left lateral neck was more fully imaged on the earlier CT and may favor a posttraumatic etiology for the prevertebral fluid. No discrete anterior longitudinal ligament disruption is identified. There is no soft tissue edema suggestive of posterior ligamentous complex injury. 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 Preserved vertebral artery flow voids in the neck. Abnormal appearance of the right V4 segment. 2.9 cm T2 hyperintense subcutaneous lesion in the posterior right lower neck, likely a sebaceous cyst. Disc levels: C2-3: Severe disc space narrowing. Disc bulging, uncovertebral spurring, infolding of the ligamentum flavum, and moderate right facet  arthrosis result in moderate spinal stenosis and mild-to-moderate right and severe left neural foraminal stenosis. C3-4: Small to moderate-sized central disc extrusion with mild superior migration and infolding of the ligamentum flavum result in moderate spinal stenosis. No significant neural foraminal stenosis. C4-5: Broad posterior disc protrusion results in mild spinal stenosis. Mild bilateral neural foraminal stenosis. C5-6: Severe disc  space narrowing. Broad-based posterior disc osteophyte complex results in mild-to-moderate spinal stenosis and severe bilateral neural foraminal stenosis. C6-7: Severe disc space narrowing. Broad-based posterior disc osteophyte complex results in mild-to-moderate spinal stenosis and severe bilateral neural foraminal stenosis. C7-T1: Minimal disc bulging and mild facet arthrosis without stenosis. IMPRESSION: 1. Diffuse cervical prevertebral fluid/edema. This could reflect posttraumatic effusion and soft tissue injury versus early infectious discitis at C5-6 and C6-7. 2. 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 Electronically Signed: By: Sebastian Ache M.D. On: 12/18/2017 11:44   Portable Chest X-ray 1 View  Result Date: 12/18/2017 CLINICAL DATA:  Sepsis, AFib EXAM: PORTABLE CHEST 1 VIEW COMPARISON:  12/17/2017 FINDINGS: Mild lingular and bilateral lower lobe opacities, likely atelectasis, although pneumonia remains possible. No frank interstitial edema.  No pleural effusion or pneumothorax. The heart is normal in size. IMPRESSION: Mild lingular and bilateral lower lobe opacities, likely atelectasis, although pneumonia remains possible. Electronically Signed   By: Charline Bills M.D.   On: 12/18/2017 07:35   Dg Chest Port 1 View  Result Date: 12/17/2017 CLINICAL DATA:  Chest pain, altered mental status EXAM: PORTABLE CHEST 1 VIEW COMPARISON:  Chest x-ray of 05/18/2017 FINDINGS: No active infiltrate or effusion is seen. Mediastinal and hilar contours are unremarkable. The heart is borderline enlarged and stable. No bony abnormality is seen. IMPRESSION: No active lung disease.  Heart upper normal in size. Electronically Signed   By: Dwyane Dee M.D.   On: 12/17/2017 11:56   Dg Chest Port 1v Same Day  Result Date: 12/17/2017 CLINICAL DATA:  72 year old male with code sepsis, and evolving lung sounds EXAM: PORTABLE CHEST 1  VIEW COMPARISON:  Chest x-ray obtained earlier today at 11:40 a.m. FINDINGS: Significantly lower inspiratory volumes with developing lingular and left lower lobe streaky airspace opacities favored to reflect atelectasis. No overt pulmonary edema, pneumothorax or pleural effusion. Stable cardiac and mediastinal contours. No acute osseous abnormality. IMPRESSION: 1. Lower inspiratory volumes with developing streaky lingular and left lower lobe airspace opacities favored to reflect atelectasis. Electronically Signed   By: Malachy Moan M.D.   On: 12/17/2017 18:08   Ct Maxillofacial Wo Cm  Result Date: 12/17/2017 CLINICAL DATA:  Found down in his apartment. Laceration to left cheek. EXAM: CT HEAD WITHOUT CONTRAST CT MAXILLOFACIAL WITHOUT CONTRAST CT CERVICAL SPINE WITHOUT CONTRAST TECHNIQUE: Multidetector CT imaging of the head, cervical spine, and maxillofacial structures were performed using the standard protocol without intravenous contrast. Multiplanar CT image reconstructions of the cervical spine and maxillofacial structures were also generated. COMPARISON:  Cervical spine x-rays dated August 03, 2008. 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 No evidence of hemorrhage, hydrocephalus, extra-axial collection or mass lesion/mass effect. Mild to moderate generalized cerebral atrophy. Mild scattered periventricular and subcortical white matter hypodensities are nonspecific, but favored to reflect chronic microvascular ischemic changes. Vascular: Atherosclerotic vascular calcification of the carotid siphons. No hyperdense vessel. Skull: Normal. Negative for fracture or focal lesion. Other: Small left frontal scalp hematoma. CT MAXILLOFACIAL FINDINGS Osseous: No fracture or mandibular dislocation. No destructive process.  Periapical lucencies involving the right maxillary first and second molars. Orbits: Negative. No traumatic or inflammatory  finding. Sinuses: Chronic appearing mild-to-moderate moderate mucosal thickening of the paranasal sinuses, moderate in the left maxillary sinus. The mastoid air cells are clear. Soft tissues: Left cheek laceration. CT CERVICAL SPINE FINDINGS Alignment: Reversal of the normal cervical lordosis, centered at C5, unchanged. No traumatic malalignment. Skull base and vertebrae: No acute cervical spine fracture. Mild anterior superior endplate height loss at T1 with associated Schmorl's node. No primary bone lesion or focal pathologic process. Soft tissues and spinal canal: Mild prevertebral soft tissue thickening. Subcutaneous stranding and fluid in the left neck. No visible canal hematoma. Disc levels: Moderate to severe disc height loss at C2-C3, C5-C6, and C6-C7, progressed when compared to prior study. Mild bilateral neuroforaminal stenosis at C5-C6 and C6-C7. Upper chest: Negative. Other: 2.9 cm superficial subcutaneous cystic lesion in the right posterior neck, likely a sebaceous cyst. IMPRESSION: 1. 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 2. No acute cervical spine fracture. Mild prevertebral soft tissue thickening and subcutaneous stranding/fluid in the left neck. Ligamentous and/or soft tissue injury is not excluded. Recommend MRI cervical spine for further evaluation. 3. Mild anterior superior endplate height loss at T1 with associated Schmorl's node is favored chronic. 4.  No acute facial fracture. 5. Small left frontal scalp hematoma.  Left cheek laceration. Electronically Signed   By: Obie Dredge M.D.   On: 12/17/2017 12:06    Catarina Hartshorn, DO  [facility] Pager [phone]  If 7PM-7AM, please contact night-coverage [email or web address] Password [email or web address] 12/19/2017, 1:18 PM   LOS: 2 days

## 2017-12-19 NOTE — Progress Notes (Signed)
Pharmacy Antibiotic Note  RENEE WALKENHORST is a 72 y.o. male admitted on 12/17/2017 with UTI.  Pharmacy has been consulted for Meropenem dosing.  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  Plan: Meropenem 500mg  IV q12hrs (renally adjusted) Monitor labs, progress, c/s  Antimicrobials this admission: Zithromax, Vancomycin, Rocephin, and Cefepime x 1 on 5/6 Unasyn 5/7 >> 5/8 Meropenem 5/8 >>   Height: 5\' 10"  (177.8 cm) Weight: 166 lb 7.2 oz (75.5 kg) IBW/kg (Calculated) : 73  Temp (24hrs), Avg:98 F (36.7 C), Min:97.8 F (36.6 C), Max:98.2 F (36.8 C)  Recent Labs  Lab 12/17/17 1105 12/17/17 1650 12/17/17 1759 12/18/17 0424 12/19/17 0421  WBC 6.1  --   --  26.6* 28.2*  CREATININE 2.44*  --   --  2.58* 2.15*  LATICACIDVEN  --  6.1* 3.7*  --   --     Estimated Creatinine Clearance: 32.1 mL/min (A) (by C-G formula based on SCr of 2.15 mg/dL (H)).    Allergies  Allergen Reactions  . Other     Pt reports "older" medication used for migraines  . Talwin [Pentazocine]     Sweating, rapid heartbeat   Dose adjustments this admission: Meropenem renally adjusted due to clcr ~ 47ml/min  Microbiology results: 5/6 BCx: pending 5/6 UCx: > 100K col klebsiella oxytoca 5/6 MRSA PCR: positive  Thank you for allowing pharmacy to be a part of this patient's care.  Valrie Hart, PharmD Clinical Pharmacist Pager:  [phone] 12/19/2017  Recent Results (from the past 240 hour(s))  Urine culture     Status: Abnormal (Preliminary result)   Collection Time: 12/17/17 11:05 AM  Result Value Ref Range Status   Specimen Description   Final    URINE, RANDOM Performed at [facility], [address] [place], [place] [address]    Special Requests   Final    NONE Performed at [facility], [address], [place], [place] [address]    Culture >=100,000 COLONIES/mL KLEBSIELLA OXYTOCA (A)  Final   Report Status PENDING  Incomplete  Blood culture (routine x 2)     Status: None (Preliminary result)   Collection  Time: 12/17/17  7:21 PM  Result Value Ref Range Status   Specimen Description BLOOD RIGHT ARM DRAWN BY RN  Final   Special Requests   Final    BOTTLES DRAWN AEROBIC AND ANAEROBIC Blood Culture adequate volume   Culture   Final    NO GROWTH 2 DAYS Performed at [facility], [address], [place], [place] [address]    Report Status PENDING  Incomplete  Blood culture (routine x 2)     Status: None (Preliminary result)   Collection Time: 12/17/17  7:21 PM  Result Value Ref Range Status   Specimen Description BLOOD RIGHT WRIST DRAWN BY RN  Final   Special Requests   Final    BOTTLES DRAWN AEROBIC AND ANAEROBIC Blood Culture adequate volume   Culture   Final    NO GROWTH 2 DAYS Performed at [facility], [address] [place], [place] [address]    Report Status PENDING  Incomplete  MRSA PCR Screening     Status: Abnormal   Collection Time: 12/17/17  8:08 PM  Result Value Ref Range Status   MRSA by PCR POSITIVE (A) NEGATIVE Final    Comment:        The GeneXpert MRSA Assay (FDA approved for NASAL specimens only), is one component of a comprehensive MRSA colonization surveillance program. It is not intended to diagnose  MRSA infection nor to guide or monitor treatment for MRSA infections. RESULT CALLED TO, READ BACK BY AND VERIFIED WITH: AMBURN,A. AT 1610 ON 12/18/2017 BY EVA Performed at [facility], [address], [place], [place] [address]     Valrie Hart A 12/19/2017 2:32 PM

## 2017-12-19 NOTE — Procedures (Signed)
ELECTROENCEPHALOGRAM REPORT  Date of Study: 12/19/2017  Patient's Name: Juan Barber MRN: [id number] Date of Birth: 1945/09/25  Referring Provider: Dr. Onalee Hua Tat  Clinical History: This is a 72 year old man with altered mental status.  Medications: acetaminophen (TYLENOL) tablet 650 mg  ampicillin-sulbactam (UNASYN) 1.5 g in sodium chloride 0.9 % 100 mL IVPB  bisacodyl (DULCOLAX) suppository 10 mg  Chlorhexidine Gluconate Cloth 2 % PADS 6 each  famotidine (PEPCID) IVPB 20 mg premix  insulin aspart (novoLOG) injection 0-15 Units  levalbuterol (XOPENEX) nebulizer solution 0.63 mg  levalbuterol (XOPENEX) nebulizer solution 0.63 mg  methylPREDNISolone sodium succinate (SOLU-MEDROL) 40 mg/mL injection 40 mg  metoprolol tartrate (LOPRESSOR) injection 2.5 mg   Technical Summary: A multichannel digital EEG recording measured by the international 10-20 system with electrodes applied with paste and impedances below 5000 ohms performed as portable with EKG monitoring in a poorly responsive patient.  Hyperventilation and photic stimulation were not performed.  The digital EEG was referentially recorded, reformatted, and digitally filtered in a variety of bipolar and referential montages for optimal display.   Description: The patient is poorly responsive during the recording. There is no clear posterior dominant rhythm seen. The background consists of a large amount of diffuse 4-5 Hz theta and occasional diffuse 2-3 Hz delta slowing. Normal sleep architecture is not seen. Hyperventilation and photic stimulation were not performed. He is noted by staff to have tremor in the left arm and head tremor with no EEG correlate. There were no epileptiform discharges or electrographic seizures seen.    EKG lead was unremarkable.  Impression: This awake and drowsy EEG is abnormal due to moderate diffuse background slowing.  Clinical Correlation of the above findings indicates diffuse cerebral  dysfunction that is non-specific in etiology and can be seen with hypoxic/ischemic injury, toxic/metabolic encephalopathies, neurodegenerative disorders, or medication effect.  The absence of epileptiform discharges does not rule out a clinical diagnosis of epilepsy.  Clinical correlation is advised.   Patrcia Dolly, M.D.

## 2017-12-19 NOTE — Progress Notes (Signed)
Echo shows stable LVEF 40-45%. Elevated troponin due to demand ischemia in setting of sepsis, normal cath just 6 months ago. No further cardiac recs at this time, monitor volume status in setting of systolic dysfunction and sepsis, he did receive IVFs on admission. Hold anticoag while platelets remain <100. May call with questions.    Dina Rich MD

## 2017-12-19 NOTE — Progress Notes (Signed)
EEG completed, results pending. 

## 2017-12-19 NOTE — Progress Notes (Addendum)
Initial Nutrition Assessment  DOCUMENTATION CODES:      INTERVENTION:  RD will continue to follow    NUTRITION DIAGNOSIS:  Increased nutrient needs related to acute illness(sepsis (UTI), rhabdomyolysis, asp. pneumonia) as evidenced by estimated needs.  GOAL:  Provide needs based on ASPEN/SCCM guidelines(If feasible and agreeable once patient goals of care are established)  MONITOR:   Diet advancement, Skin, Weight trends, I & O's, PO intake  REASON FOR ASSESSMENT:   Malnutrition Screening Tool    ASSESSMENT:  Presents with sepsis secondary to UTI, acute renal failure related to rhabdomyolysis, aspiration pneumonia, closed fx right radius, elevated troponin. Hx of chronic CHF  MRI today: cervical edema, advanced disc degeneration C5-6 &C-6-7. CT Cervical spine no acute facial fx. He was found face down in his home. ST assessment completed: Patient has dysphagia. NPO recommended at this time. Identified as severe aspiration risk. ST suspects neurologically related dysphagia. Patient apparently lives alone. Unclear at this time what if any family support he has.  Palliative consulted- met briefly with son and daughter and is to follow up with them tomorrow.  His weight is down slightly. Unsure at this time how long the patient was on the floor without food or drink or what his usual intake is. He is not responding to questions at this time. Discussed pt with nursing. RD will follow up tomorrow.  5/8 Discussed with nursing. Patient remains poorly responsive per MD. Neurology has assessed pt today -abnormal EEG findings. His glucose and sodium levels are trending up.  He remains NPO.  Palliative is also following and has been addressing code status and goals of care. It is unclear at this time whether pt will be appropriate for nutrition support.   Intake/Output Summary (Last 24 hours) at 12/19/2017 1554 Last data filed at 12/19/2017 1034 Gross per 24 hour  Intake 356 ml  Output 1800  ml  Net -1444 ml      Labs: BMP Latest Ref Rng & Units 12/19/2017 12/18/2017 12/17/2017  Glucose 65 - 99 mg/dL 249(H) 217(H) 172(H)  BUN 6 - 20 mg/dL 92(H) 98(H) 105(H)  Creatinine 0.61 - 1.24 mg/dL 2.15(H) 2.58(H) 2.44(H)  BUN/Creat Ratio 6 - 22 (calc) - - -  Sodium 135 - 145 mmol/L 152(H) 146(H) 142  Potassium 3.5 - 5.1 mmol/L 4.4 4.6 4.7  Chloride 101 - 111 mmol/L 121(H) 117(H) 105  CO2 22 - 32 mmol/L 19(L) 18(L) 22  Calcium 8.9 - 10.3 mg/dL 8.3(L) 7.7(L) 9.1    Past Medical History:  Diagnosis Date  . Atrial fibrillation ([place])   . Nonischemic cardiomyopathy ([place])    a. echo in 05/2017 showing reduced EF of 30-35% - cath showing normal cors  . Seizures ([place])     Past Surgical History:  Procedure Laterality Date  . HERNIA REPAIR    . LEFT HEART CATH AND CORONARY ANGIOGRAPHY N/A 06/05/2017   Procedure: LEFT HEART CATH AND CORONARY ANGIOGRAPHY;  Surgeon: Martinique, Peter M, MD;  Location: [facility];  Service: Cardiovascular;  Laterality: N/A;   Scheduled Meds: . Chlorhexidine Gluconate Cloth  6 each Topical Q0600  . insulin aspart  0-9 Units Subcutaneous TID WC  . levalbuterol  0.63 mg Nebulization Q6H  . mouth rinse  15 mL Mouth Rinse BID  . metoprolol tartrate  2.5 mg Intravenous Q8H  . mupirocin ointment  1 application Nasal BID  . mupirocin ointment   Topical Daily  . sodium chloride flush  3 mL Intravenous Q12H  NUTRITION - FOCUSED PHYSICAL EXAM: Deferred -  Diet Order:   Diet Order           Diet NPO time specified  Diet effective now          EDUCATION NEEDS:   Not appropriate for education at this time Skin:  Skin Assessment: Reviewed RN Assessment(MASD and multiple abrasions)  Last BM:  unknown  Height:   Ht Readings from Last 1 Encounters:  12/17/17 '5\' 10"'$  (1.778 m)    Weight:   Wt Readings from Last 1 Encounters:  12/19/17 166 lb 7.2 oz (75.5 kg)    Ideal Body Weight:  75 kg  BMI:  Body mass index is 23.88 kg/m.  Estimated  Nutritional Needs:   Kcal:  2156-2387 (28-31 kcal/kg/bw)  Protein:  92-108gr (1.2-1.4 gr/kg /bw)  Fluid:  24-hr Urine output + 500 ml insensible losses  Colman Cater MS,RD,CSG,LDN Office: [phone] Pager: [phone]

## 2017-12-19 NOTE — Consult Note (Signed)
Reason for Consult: Wound, left anterior chest wall Referring Physician: Dr. Doreen Barber is an 72 y.o. male.  HPI: Patient is a 72 year old black male with multiple medical problems who was found down in his home after a welfare check was performed at his home.  He was admitted on 12/17/2017.  He was noted to have multiple wounds on his body, but one in particular was in the left anterior chest wall.  It is unknown how he obtained these.  Review of systems is not obtainable.  Family has not noticed this wound before.  Past Medical History:  Diagnosis Date  . Atrial fibrillation ([place])   . Nonischemic cardiomyopathy ([place])    a. echo in 05/2017 showing reduced EF of 30-35% - cath showing normal cors  . Seizures ([place])     Past Surgical History:  Procedure Laterality Date  . HERNIA REPAIR    . LEFT HEART CATH AND CORONARY ANGIOGRAPHY N/A 06/05/2017   Procedure: LEFT HEART CATH AND CORONARY ANGIOGRAPHY;  Surgeon: Barber, Juan M, MD;  Location: [facility];  Service: Cardiovascular;  Laterality: N/A;    Family History  Problem Relation Age of Onset  . Heart attack Mother        had AMI in 64s  . AAA (abdominal aortic aneurysm) Neg Hx     Social History:  reports that he has never smoked. He has never used smokeless tobacco. He reports that he drinks alcohol. He reports that he does not use drugs.  Allergies:  Allergies  Allergen Reactions  . Other     Pt reports "older" medication used for migraines  . Talwin [Pentazocine]     Sweating, rapid heartbeat    Medications: I have reviewed the patient's current medications.  Results for orders placed or performed during the hospital encounter of 12/17/17 (from the past 48 hour(s))  Comprehensive metabolic panel     Status: Abnormal   Collection Time: 12/17/17 11:05 AM  Result Value Ref Range   Sodium 142 135 - 145 mmol/L   Potassium 4.7 3.5 - 5.1 mmol/L   Chloride 105 101 - 111 mmol/L   CO2 22 22 - 32 mmol/L    Glucose, Bld 172 (H) 65 - 99 mg/dL   BUN 105 (H) 6 - 20 mg/dL   Creatinine, Ser 2.44 (H) 0.61 - 1.24 mg/dL   Calcium 9.1 8.9 - 10.3 mg/dL   Total Protein 8.4 (H) 6.5 - 8.1 g/dL   Albumin 3.0 (L) 3.5 - 5.0 g/dL   AST 74 (H) 15 - 41 U/L   ALT 30 17 - 63 U/L   Alkaline Phosphatase 45 38 - 126 U/L   Total Bilirubin 3.5 (H) 0.3 - 1.2 mg/dL   GFR calc non Af Amer 25 (L) >60 mL/min   GFR calc Af Amer 29 (L) >60 mL/min    Comment: (NOTE) The eGFR has been calculated using the CKD EPI equation. This calculation has not been validated in all clinical situations. eGFR's persistently <60 mL/min signify possible Chronic Kidney Disease.    Anion gap 15 5 - 15    Comment: Performed at [facility], [address], [place], [place] [address]  CBC     Status: Abnormal   Collection Time: 12/17/17 11:05 AM  Result Value Ref Range   WBC 6.1 4.0 - 10.5 K/uL   RBC 4.25 4.22 - 5.81 MIL/uL   Hemoglobin 13.8 13.0 - 17.0 g/dL   HCT 41.4 39.0 - 52.0 %  MCV 97.4 78.0 - 100.0 fL   MCH 32.5 26.0 - 34.0 pg   MCHC 33.3 30.0 - 36.0 g/dL   RDW 13.1 11.5 - 15.5 %   Platelets 60 (L) 150 - 400 K/uL    Comment: SPECIMEN CHECKED FOR CLOTS PLATELET COUNT CONFIRMED BY SMEAR Performed at [facility], [address], [place], [place] [address]   CK     Status: Abnormal   Collection Time: 12/17/17 11:05 AM  Result Value Ref Range   Total CK 898 (H) 49 - 397 U/L    Comment: Performed at [facility], [address], [place], [place] [address]  Troponin I     Status: Abnormal   Collection Time: 12/17/17 11:05 AM  Result Value Ref Range   Troponin I 0.90 (HH) <0.03 ng/mL    Comment: CRITICAL RESULT CALLED TO, READ BACK BY AND VERIFIED WITH: CREWS,M AT 11:50AM ON 12/17/17 BY Derrill Memo Performed at [facility], [address], [place], [place] [address]   Urinalysis, Routine w reflex microscopic     Status: Abnormal   Collection Time: 12/17/17 11:05 AM  Result Value Ref Range   Color, Urine AMBER (A) YELLOW     Comment: BIOCHEMICALS MAY BE AFFECTED BY COLOR   APPearance CLOUDY (A) CLEAR   Specific Gravity, Urine 1.015 1.005 - 1.030   pH 5.0 5.0 - 8.0   Glucose, UA NEGATIVE NEGATIVE mg/dL   Hgb urine dipstick LARGE (A) NEGATIVE   Bilirubin Urine NEGATIVE NEGATIVE   Ketones, ur 5 (A) NEGATIVE mg/dL   Protein, ur 30 (A) NEGATIVE mg/dL   Nitrite NEGATIVE NEGATIVE   Leukocytes, UA MODERATE (A) NEGATIVE   RBC / HPF >50 (H) 0 - 5 RBC/hpf   WBC, UA >50 (H) 0 - 5 WBC/hpf   Bacteria, UA MANY (A) NONE SEEN   WBC Clumps PRESENT    Mucus PRESENT     Comment: Performed at [facility], [address], [place], [place] [address]  CBG monitoring, ED     Status: Abnormal   Collection Time: 12/17/17 11:13 AM  Result Value Ref Range   Glucose-Capillary 161 (H) 65 - 99 mg/dL  Magnesium     Status: Abnormal   Collection Time: 12/17/17 11:46 AM  Result Value Ref Range   Magnesium 2.8 (H) 1.7 - 2.4 mg/dL    Comment: Performed at [facility], [address] [place], [place] [address]  Lactic acid, plasma     Status: Abnormal   Collection Time: 12/17/17  4:50 PM  Result Value Ref Range   Lactic Acid, Venous 6.1 (HH) 0.5 - 1.9 mmol/L    Comment: CRITICAL RESULT CALLED TO, READ BACK BY AND VERIFIED WITH: CARDWELL,L ON 12/17/17 AT 1820 BY LOY,C Performed at [facility], [address], [place], [place] [address]   Lactic acid, plasma     Status: Abnormal   Collection Time: 12/17/17  5:59 PM  Result Value Ref Range   Lactic Acid, Venous 3.7 (HH) 0.5 - 1.9 mmol/L    Comment: CRITICAL RESULT CALLED TO, READ BACK BY AND VERIFIED WITH: POINDEXTER,M ON 12/17/17 AT 1940 BY LOY,C Performed at [facility], [address] [place], [place] [address]   Procalcitonin     Status: None   Collection Time: 12/17/17  5:59 PM  Result Value Ref Range   Procalcitonin 46.76 ng/mL    Comment:        Interpretation: PCT >= 10 ng/mL: Important systemic inflammatory response, almost exclusively due to severe bacterial  sepsis or  septic shock. (NOTE)       Sepsis PCT Algorithm           Lower Respiratory Tract                                      Infection PCT Algorithm    ----------------------------     ----------------------------         PCT < 0.25 ng/mL                PCT < 0.10 ng/mL         Strongly encourage             Strongly discourage   discontinuation of antibiotics    initiation of antibiotics    ----------------------------     -----------------------------       PCT 0.25 - 0.50 ng/mL            PCT 0.10 - 0.25 ng/mL               OR       >80% decrease in PCT            Discourage initiation of                                            antibiotics      Encourage discontinuation           of antibiotics    ----------------------------     -----------------------------         PCT >= 0.50 ng/mL              PCT 0.26 - 0.50 ng/mL                AND       <80% decrease in PCT             Encourage initiation of                                             antibiotics       Encourage continuation           of antibiotics    ----------------------------     -----------------------------        PCT >= 0.50 ng/mL                  PCT > 0.50 ng/mL               AND         increase in PCT                  Strongly encourage                                      initiation of antibiotics    Strongly encourage escalation           of antibiotics                                     -----------------------------  PCT <= 0.25 ng/mL                                                 OR                                        > 80% decrease in PCT                                     Discontinue / Do not initiate                                             antibiotics Performed at [facility], [address] [place], [place] [address]   TSH     Status: None   Collection Time: 12/17/17  5:59 PM  Result Value Ref Range   TSH 1.211 0.350 - 4.500 uIU/mL    Comment:  Performed by a 3rd Generation assay with a functional sensitivity of <=0.01 uIU/mL. Performed at [facility], [address] [place], [place] [address]   Hemoglobin A1c     Status: Abnormal   Collection Time: 12/17/17  5:59 PM  Result Value Ref Range   Hgb A1c MFr Bld 5.8 (H) 4.8 - 5.6 %    Comment: (NOTE) Pre diabetes:          5.7%-6.4% Diabetes:              >6.4% Glycemic control for   <7.0% adults with diabetes    Mean Plasma Glucose 119.76 mg/dL    Comment: Performed at [facility] [address], [place], [place] [address]  Troponin I     Status: Abnormal   Collection Time: 12/17/17  5:59 PM  Result Value Ref Range   Troponin I 0.88 (HH) <0.03 ng/mL    Comment: DELTA CHECK NOTED Performed at [facility], [address], [place], [place] [address]   APTT     Status: None   Collection Time: 12/17/17  6:30 PM  Result Value Ref Range   aPTT 29 24 - 36 seconds    Comment: Performed at [facility], [address] [place], [place] [address]  Heparin level (unfractionated)     Status: Abnormal   Collection Time: 12/17/17  6:30 PM  Result Value Ref Range   Heparin Unfractionated <0.10 (L) 0.30 - 0.70 IU/mL    Comment:        IF HEPARIN RESULTS ARE BELOW EXPECTED VALUES, AND PATIENT DOSAGE HAS BEEN CONFIRMED, SUGGEST FOLLOW UP TESTING OF ANTITHROMBIN III LEVELS. Performed at [facility], [address] [place], [place] [address]   Blood culture (routine x 2)     Status: None (Preliminary result)   Collection Time: 12/17/17  7:21 PM  Result Value Ref Range   Specimen Description BLOOD RIGHT ARM DRAWN BY RN    Special Requests      BOTTLES DRAWN AEROBIC AND ANAEROBIC Blood Culture adequate volume   Culture      NO GROWTH 2 DAYS Performed at [facility], [address],  [place], [place] [address]    Report Status PENDING   Blood culture (routine x 2)     Status: None (Preliminary result)   Collection Time: 12/17/17  7:21 PM  Result Value Ref Range   Specimen  Description BLOOD RIGHT WRIST DRAWN BY RN    Special Requests      BOTTLES DRAWN AEROBIC AND ANAEROBIC Blood Culture adequate volume   Culture      NO GROWTH 2 DAYS Performed at [facility], [address] [place], [place] [address]    Report Status PENDING   MRSA PCR Screening     Status: Abnormal   Collection Time: 12/17/17  8:08 PM  Result Value Ref Range   MRSA by PCR POSITIVE (A) NEGATIVE    Comment:        The GeneXpert MRSA Assay (FDA approved for NASAL specimens only), is one component of a comprehensive MRSA colonization surveillance program. It is not intended to diagnose MRSA infection nor to guide or monitor treatment for MRSA infections. RESULT CALLED TO, READ BACK BY AND VERIFIED WITH: AMBURN,A. AT 0253 ON 12/18/2017 BY EVA Performed at [facility], [address] [place], [place] [address]   Glucose, capillary     Status: Abnormal   Collection Time: 12/17/17 10:00 PM  Result Value Ref Range   Glucose-Capillary 133 (H) 65 - 99 mg/dL  Urine rapid drug screen (hosp performed)     Status: None   Collection Time: 12/17/17 10:44 PM  Result Value Ref Range   Opiates NONE DETECTED NONE DETECTED   Cocaine NONE DETECTED NONE DETECTED   Benzodiazepines NONE DETECTED NONE DETECTED   Amphetamines NONE DETECTED NONE DETECTED   Tetrahydrocannabinol NONE DETECTED NONE DETECTED   Barbiturates NONE DETECTED NONE DETECTED    Comment: (NOTE) DRUG SCREEN FOR MEDICAL PURPOSES ONLY.  IF CONFIRMATION IS NEEDED FOR ANY PURPOSE, NOTIFY LAB WITHIN 5 DAYS. LOWEST DETECTABLE LIMITS FOR URINE DRUG SCREEN Drug Class                     Cutoff (ng/mL) Amphetamine and metabolites    1000 Barbiturate and metabolites    200 Benzodiazepine                 573 Tricyclics and metabolites     300 Opiates and metabolites        300 Cocaine and metabolites        300 THC                            50 Performed at [facility], [address] [place], [place] [address]   Troponin I      Status: Abnormal   Collection Time: 12/18/17 12:47 AM  Result Value Ref Range   Troponin I 0.65 (HH) <0.03 ng/mL    Comment: CRITICAL VALUE NOTED.  VALUE IS CONSISTENT WITH PREVIOUSLY REPORTED AND CALLED VALUE. Performed at [facility], [address], [place], [place] [address]   Troponin I     Status: Abnormal   Collection Time: 12/18/17  4:24 AM  Result Value Ref Range   Troponin I 0.54 (HH) <0.03 ng/mL    Comment: CRITICAL VALUE NOTED.  VALUE IS CONSISTENT WITH PREVIOUSLY REPORTED AND CALLED VALUE. Performed at [facility], [address] [place],  [address]   Comprehensive metabolic panel     Status: Abnormal   Collection Time: 12/18/17  4:24 AM  Result Value Ref Range  Sodium 146 (H) 135 - 145 mmol/L   Potassium 4.6 3.5 - 5.1 mmol/L   Chloride 117 (H) 101 - 111 mmol/L   CO2 18 (L) 22 - 32 mmol/L   Glucose, Bld 217 (H) 65 - 99 mg/dL   BUN 98 (H) 6 - 20 mg/dL   Creatinine, Ser 2.58 (H) 0.61 - 1.24 mg/dL   Calcium 7.7 (L) 8.9 - 10.3 mg/dL   Total Protein 6.6 6.5 - 8.1 g/dL   Albumin 2.2 (L) 3.5 - 5.0 g/dL   AST 71 (H) 15 - 41 U/L   ALT 23 17 - 63 U/L   Alkaline Phosphatase 35 (L) 38 - 126 U/L   Total Bilirubin 3.8 (H) 0.3 - 1.2 mg/dL   GFR calc non Af Amer 23 (L) >60 mL/min   GFR calc Af Amer 27 (L) >60 mL/min    Comment: (NOTE) The eGFR has been calculated using the CKD EPI equation. This calculation has not been validated in all clinical situations. eGFR's persistently <60 mL/min signify possible Chronic Kidney Disease.    Anion gap 11 5 - 15    Comment: Performed at [facility], [address], [place], [place] [address]  CBC     Status: Abnormal   Collection Time: 12/18/17  4:24 AM  Result Value Ref Range   WBC 26.6 (H) 4.0 - 10.5 K/uL    Comment: WHITE COUNT CONFIRMED ON SMEAR   RBC 3.37 (L) 4.22 - 5.81 MIL/uL   Hemoglobin 11.0 (L) 13.0 - 17.0 g/dL    Comment: DELTA CHECK NOTED   HCT 32.8 (L) 39.0 - 52.0 %   MCV 97.3 78.0 - 100.0 fL   MCH 32.6 26.0  - 34.0 pg   MCHC 33.5 30.0 - 36.0 g/dL   RDW 13.8 11.5 - 15.5 %   Platelets 38 (L) 150 - 400 K/uL    Comment: SPECIMEN CHECKED FOR CLOTS PLATELET COUNT CONFIRMED BY SMEAR Performed at [facility], [address], [place], [place] [address]   Heparin level (unfractionated)     Status: None   Collection Time: 12/18/17  4:24 AM  Result Value Ref Range   Heparin Unfractionated 0.44 0.30 - 0.70 IU/mL    Comment:        IF HEPARIN RESULTS ARE BELOW EXPECTED VALUES, AND PATIENT DOSAGE HAS BEEN CONFIRMED, SUGGEST FOLLOW UP TESTING OF ANTITHROMBIN III LEVELS. Performed at [facility], [address], [place], [place] [address]   Glucose, capillary     Status: Abnormal   Collection Time: 12/18/17  7:37 AM  Result Value Ref Range   Glucose-Capillary 164 (H) 65 - 99 mg/dL  Glucose, capillary     Status: Abnormal   Collection Time: 12/18/17 11:28 AM  Result Value Ref Range   Glucose-Capillary 180 (H) 65 - 99 mg/dL  Glucose, capillary     Status: Abnormal   Collection Time: 12/18/17  4:02 PM  Result Value Ref Range   Glucose-Capillary 143 (H) 65 - 99 mg/dL  Glucose, capillary     Status: Abnormal   Collection Time: 12/18/17 10:24 PM  Result Value Ref Range   Glucose-Capillary 174 (H) 65 - 99 mg/dL   Comment 1 Notify RN   CBC     Status: Abnormal   Collection Time: 12/19/17  4:21 AM  Result Value Ref Range   WBC 28.2 (H) 4.0 - 10.5 K/uL   RBC 3.42 (L) 4.22 - 5.81 MIL/uL   Hemoglobin 10.8 (L) 13.0 - 17.0 g/dL  HCT 33.3 (L) 39.0 - 52.0 %   MCV 97.4 78.0 - 100.0 fL   MCH 31.6 26.0 - 34.0 pg   MCHC 32.4 30.0 - 36.0 g/dL   RDW 14.3 11.5 - 15.5 %   Platelets 42 (L) 150 - 400 K/uL    Comment: SPECIMEN CHECKED FOR CLOTS CONSISTENT WITH PREVIOUS RESULT Performed at [facility], [address], [place], [place] [address]   Basic metabolic panel     Status: Abnormal   Collection Time: 12/19/17  4:21 AM  Result Value Ref Range   Sodium 152 (H) 135 - 145 mmol/L   Potassium 4.4 3.5 - 5.1  mmol/L   Chloride 121 (H) 101 - 111 mmol/L   CO2 19 (L) 22 - 32 mmol/L   Glucose, Bld 249 (H) 65 - 99 mg/dL   BUN 92 (H) 6 - 20 mg/dL   Creatinine, Ser 2.15 (H) 0.61 - 1.24 mg/dL   Calcium 8.3 (L) 8.9 - 10.3 mg/dL   GFR calc non Af Amer 29 (L) >60 mL/min   GFR calc Af Amer 34 (L) >60 mL/min    Comment: (NOTE) The eGFR has been calculated using the CKD EPI equation. This calculation has not been validated in all clinical situations. eGFR's persistently <60 mL/min signify possible Chronic Kidney Disease.    Anion gap 12 5 - 15    Comment: Performed at [facility], [address], [place],  [address]  Glucose, capillary     Status: Abnormal   Collection Time: 12/19/17  7:58 AM  Result Value Ref Range   Glucose-Capillary 232 (H) 65 - 99 mg/dL    Ct Head Wo Contrast  Result Date: 12/17/2017 CLINICAL DATA:  Found down in his apartment. Laceration to left cheek. EXAM: CT HEAD WITHOUT CONTRAST CT MAXILLOFACIAL WITHOUT CONTRAST CT CERVICAL SPINE WITHOUT CONTRAST TECHNIQUE: Multidetector CT imaging of the head, cervical spine, and maxillofacial structures were performed using the standard protocol without intravenous contrast. Multiplanar CT image reconstructions of the cervical spine and maxillofacial structures were also generated. COMPARISON:  Cervical spine x-rays dated August 03, 2008. 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 No evidence of hemorrhage, hydrocephalus, extra-axial collection or mass lesion/mass effect. Mild to moderate generalized cerebral atrophy. Mild scattered periventricular and subcortical white matter hypodensities are nonspecific, but favored to reflect chronic microvascular ischemic changes. Vascular: Atherosclerotic vascular calcification of the carotid siphons. No hyperdense vessel. Skull: Normal. Negative for fracture or focal lesion. Other: Small left frontal scalp hematoma. CT MAXILLOFACIAL  FINDINGS Osseous: No fracture or mandibular dislocation. No destructive process. Periapical lucencies involving the right maxillary first and second molars. Orbits: Negative. No traumatic or inflammatory finding. Sinuses: Chronic appearing mild-to-moderate moderate mucosal thickening of the paranasal sinuses, moderate in the left maxillary sinus. The mastoid air cells are clear. Soft tissues: Left cheek laceration. CT CERVICAL SPINE FINDINGS Alignment: Reversal of the normal cervical lordosis, centered at C5, unchanged. No traumatic malalignment. Skull base and vertebrae: No acute cervical spine fracture. Mild anterior superior endplate height loss at T1 with associated Schmorl's node. No primary bone lesion or focal pathologic process. Soft tissues and spinal canal: Mild prevertebral soft tissue thickening. Subcutaneous stranding and fluid in the left neck. No visible canal hematoma. Disc levels: Moderate to severe disc height loss at C2-C3, C5-C6, and C6-C7, progressed when compared to prior study. Mild bilateral neuroforaminal stenosis at C5-C6 and C6-C7. Upper chest: Negative. Other: 2.9 cm superficial subcutaneous cystic lesion in  the right posterior neck, likely a sebaceous cyst. IMPRESSION: 1. 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 2. No acute cervical spine fracture. Mild prevertebral soft tissue thickening and subcutaneous stranding/fluid in the left neck. Ligamentous and/or soft tissue injury is not excluded. Recommend MRI cervical spine for further evaluation. 3. Mild anterior superior endplate height loss at T1 with associated Schmorl's node is favored chronic. 4.  No acute facial fracture. 5. Small left frontal scalp hematoma.  Left cheek laceration. Electronically Signed   By: Titus Dubin M.D.   On: 12/17/2017 12:06   Ct Cervical Spine Wo Contrast  Result Date: 12/17/2017 CLINICAL DATA:  Found down in his apartment. Laceration to left cheek. EXAM: CT HEAD  WITHOUT CONTRAST CT MAXILLOFACIAL WITHOUT CONTRAST CT CERVICAL SPINE WITHOUT CONTRAST TECHNIQUE: Multidetector CT imaging of the head, cervical spine, and maxillofacial structures were performed using the standard protocol without intravenous contrast. Multiplanar CT image reconstructions of the cervical spine and maxillofacial structures were also generated. COMPARISON:  Cervical spine x-rays dated August 03, 2008. 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 No evidence of hemorrhage, hydrocephalus, extra-axial collection or mass lesion/mass effect. Mild to moderate generalized cerebral atrophy. Mild scattered periventricular and subcortical white matter hypodensities are nonspecific, but favored to reflect chronic microvascular ischemic changes. Vascular: Atherosclerotic vascular calcification of the carotid siphons. No hyperdense vessel. Skull: Normal. Negative for fracture or focal lesion. Other: Small left frontal scalp hematoma. CT MAXILLOFACIAL FINDINGS Osseous: No fracture or mandibular dislocation. No destructive process. Periapical lucencies involving the right maxillary first and second molars. Orbits: Negative. No traumatic or inflammatory finding. Sinuses: Chronic appearing mild-to-moderate moderate mucosal thickening of the paranasal sinuses, moderate in the left maxillary sinus. The mastoid air cells are clear. Soft tissues: Left cheek laceration. CT CERVICAL SPINE FINDINGS Alignment: Reversal of the normal cervical lordosis, centered at C5, unchanged. No traumatic malalignment. Skull base and vertebrae: No acute cervical spine fracture. Mild anterior superior endplate height loss at T1 with associated Schmorl's node. No primary bone lesion or focal pathologic process. Soft tissues and spinal canal: Mild prevertebral soft tissue thickening. Subcutaneous stranding and fluid in the left neck. No visible canal hematoma. Disc levels:  Moderate to severe disc height loss at C2-C3, C5-C6, and C6-C7, progressed when compared to prior study. Mild bilateral neuroforaminal stenosis at C5-C6 and C6-C7. Upper chest: Negative. Other: 2.9 cm superficial subcutaneous cystic lesion in the right posterior neck, likely a sebaceous cyst. IMPRESSION: 1. 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 2. No acute cervical spine fracture. Mild prevertebral soft tissue thickening and subcutaneous stranding/fluid in the left neck. Ligamentous and/or soft tissue injury is not excluded. Recommend MRI cervical spine for further evaluation. 3. Mild anterior superior endplate height loss at T1 with associated Schmorl's node is favored chronic. 4.  No acute facial fracture. 5. Small left frontal scalp hematoma.  Left cheek laceration. Electronically Signed   By: Titus Dubin M.D.   On: 12/17/2017 12:06   Mr Jodene Nam Head Wo Contrast  Result Date: 12/18/2017 CLINICAL DATA:  Altered mental status EXAM: MRI HEAD WITHOUT CONTRAST MRA HEAD WITHOUT CONTRAST TECHNIQUE: Multiplanar, multiecho pulse sequences of the brain and surrounding structures were obtained without intravenous contrast. Angiographic images of the head were obtained using MRA technique without contrast. COMPARISON:  None. FINDINGS: MRI HEAD FINDINGS Brain: Multifocal abnormal diffusion restriction. The largest areas involve the left anterior and middle cerebral artery territories. There are  smaller infarct scattered within the left deep gray structures, right occipital lobe, right temporal lobe, right midbrain and both cerebellar hemispheres. Extensive hyperintense T2-weighted signal corresponds to the areas of ischemia. There is no midline shift. No acute hemorrhage. No mass lesion. No chronic microhemorrhage or cerebral amyloid angiopathy. No hydrocephalus, age advanced atrophy or lobar predominant volume loss. No dural abnormality or extra-axial collection. Skull and upper  cervical spine: The visualized skull base, calvarium, upper cervical spine and extracranial soft tissues are normal. Sinuses/Orbits: Moderate left maxillary sinus mucosal disease. Small amount of bilateral mastoid fluid. Normal orbits. MRA HEAD FINDINGS Intracranial internal carotid arteries: Normal. Anterior cerebral arteries: The left anterior cerebral arteries occluded of the proximal A2 segment. Normal right ACA. Middle cerebral arteries: There is occlusion of the left middle cerebral artery inferior division. Superior division and M1 segment are normal. Right MCA is normal. Posterior communicating arteries: Present bilaterally. Posterior cerebral arteries: Bilateral fetal origins.  Normal. Basilar artery: Normal. Vertebral arteries: Left dominant. Distal right vertebral artery appears occluded or severely stenotic. Superior cerebellar arteries: Normal. Anterior inferior cerebellar arteries: Normal. Posterior inferior cerebellar arteries: Nonvisualization of right PICA. Normal left. IMPRESSION: 1. 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 No acute hemorrhage or herniation. 2. Multiple small foci of acute ischemia within the left basal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

## 2017-12-19 NOTE — Evaluation (Signed)
Physical Therapy Evaluation Patient Details Name: Juan Barber MRN: [id number] DOB: January 13, 1946 Today's Date: 12/19/2017   History of Present Illness  Juan Barber is a 72 y.o. 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  Neighbors had not seen him since Thursday and the police was present at his apartment for another reason and the apartment staff decided to check on the patient.  They found him down on the floor unresponsive covered in urine.  Brought into the emergency department where he was noted to be in atrial fibrillation with rapid ventricular response.  He was given multiple medications including diltiazem and Lopressor to improve his heart rate.  Ultimately was started on a diltiazem drip.  After that his blood pressure dropped when he spiked a temperature to 1.9 in the emergency department.  Blood pressure had been in the 180s systolic and then dropped to the 78/47 range.  Received 4 L of IV fluid and his blood pressure has improved to the mid to low 100s currently 107/62.  The patient was found to have markedly infected urine, and elevated troponin, elevated blood glucoses, and elevated bilirubin, and evaded CPK assistant with acute rhabdomyolysis.  CT scan of the head showed lacunar infarcts of undetermined age, no acute C-spine fracture some soft tissue swelling of the left knee no facial fractures and a left frontal scalp hematoma.  Sepsis protocol was initiated and the patient received appropriate IV fluids, IV antibiotics, and take acid was found to be 6 with a repeat of 3.  Once he received that significant amount of IV fluid his chest became more congested.  Repeat chest x-ray revealed left lower lobe atelectasis but given concerns regarding his pulmonary health and possible aspiration associated with  mental status changes while on the ground antibiotics were started to cover the patient for both a severe urinary tract infection and a pneumonia.    Clinical Impression  Initially patient unable to keep eyes open, once sat up at bedside with Max/total assist, patient able to keep eyes open for rest of visit, non-verbal, with poor return for following directions at this time, movement noted in BLE, left greater than right, some spasticity noted in LLE, patient to weak to attempt transfers or sit to stands and put back to bed with 2 person assist to reposition.  Patient will benefit from continued physical therapy in hospital and recommended venue below to increase strength, balance, endurance for safe ADLs and gait.    Follow Up Recommendations SNF;Supervision/Assistance - 24 hour    Equipment Recommendations  None recommended by PT(to be determined)    Recommendations for Other Services       Precautions / Restrictions Precautions Precautions: Fall Restrictions Weight Bearing Restrictions: No      Mobility  Bed Mobility Overal bed mobility: Needs Assistance Bed Mobility: Supine to Sit;Sit to Supine     Supine to sit: Max assist;Total assist Sit to supine: Total assist   General bed mobility comments: attempted to support self with LUE during supine to sitting  Transfers                    Ambulation/Gait                Stairs            Wheelchair Mobility  Modified Rankin (Stroke Patients Only)       Balance Overall balance assessment: Needs assistance Sitting-balance support: Feet supported;Bilateral upper extremity supported Sitting balance-Leahy Scale: Poor Sitting balance - Comments: unable keep trunk in midline                                     Pertinent Vitals/Pain Pain Assessment: Faces Faces Pain Scale: No hurt    Home Living Family/patient expects to be discharged to:: Private residence Living Arrangements:  Alone Available Help at Discharge: Family Type of Home: House           Additional Comments: Patient is a poor historian    Prior Function Level of Independence: Independent               Hand Dominance        Extremity/Trunk Assessment   Upper Extremity Assessment Upper Extremity Assessment: Defer to OT evaluation    Lower Extremity Assessment Lower Extremity Assessment: RLE deficits/detail;LLE deficits/detail RLE Deficits / Details: grossly 1/5 LLE Deficits / Details: grossly 2/5    Cervical / Trunk Assessment Cervical / Trunk Assessment: Kyphotic  Communication   Communication: Expressive difficulties(unable to speak at this time)  Cognition Arousal/Alertness: Awake/alert Behavior During Therapy: Flat affect Overall Cognitive Status: Impaired/Different from baseline Area of Impairment: Orientation;Attention;Following commands                               General Comments: Patient became more alert once sitting, but unable to follow instructions due to lethargy      General Comments      Exercises     Assessment/Plan    PT Assessment Patient needs continued PT services  PT Problem List Decreased strength;Decreased activity tolerance;Decreased balance;Decreased mobility       PT Treatment Interventions Gait training;Functional mobility training;Therapeutic activities;Therapeutic exercise;Patient/family education;Neuromuscular re-education    PT Goals (Current goals can be found in the Care Plan section)  Acute Rehab PT Goals Patient Stated Goal: not stated PT Goal Formulation: With patient Time For Goal Achievement: 01/02/18 Potential to Achieve Goals: Fair    Frequency Min 4X/week   Barriers to discharge        Co-evaluation               AM-PAC PT "6 Clicks" Daily Activity  Outcome Measure Difficulty turning over in bed (including adjusting bedclothes, sheets and blankets)?: A Lot Difficulty moving from lying on  back to sitting on the side of the bed? : A Lot Difficulty sitting down on and standing up from a chair with arms (e.g., wheelchair, bedside commode, etc,.)?: Unable Help needed moving to and from a bed to chair (including a wheelchair)?: Total Help needed walking in hospital room?: Total Help needed climbing 3-5 steps with a railing? : Total 6 Click Score: 8    End of Session   Activity Tolerance: Patient limited by lethargy Patient left: in bed;with call bell/phone within reach;with bed alarm set;with family/visitor present Nurse Communication: Mobility status PT Visit Diagnosis: Unsteadiness on feet (R26.81);Other abnormalities of gait and mobility (R26.89);Muscle weakness (generalized) (M62.81)    Time: 1537-9432 PT Time Calculation (min) (ACUTE ONLY): 21 min   Charges:   PT Evaluation $PT Eval Moderate Complexity: 1 Mod PT Treatments $Therapeutic Activity: 8-22 mins   PT G Codes:        12:26 PM,  12/19/17 [name], MPT Physical Therapist with [facility] [phone] office [phone] mobile phone

## 2017-12-19 NOTE — Consult Note (Signed)
[place] A. Merlene Laughter, MD     [email or web address]          DASHEL GOINES is an 72 y.o. male.   ASSESSMENT/PLAN: 1. Multifactorial encephalopathy: Etiologies includes multiple ischemic infarcts that are acute,urosepsis, dehydration hypernatremia, and acute renal failure. The patient's overall prognosis for functional recovery is poor.  The family will need to assess how vigorously will continue maximal treatment. He can be restarted on anticoagulation.   2. Chronic atrial fibrillation: Likely substrate for the patient's multiple infarcts.  3. Chronic gait impairment     The patient is 72 year old male who is alone. He has had some difficulties getting around recently most due to gait impairment. The history is obtained from a neighbor reports that the patient has had a few falls recently. He has fractured bones related to his recent fall. The patient was found unresponsive after not being seen for a couple days. He was found to be hypovolemic and hypotensive and in likely sepsis. He did have elevated CPK suspicious for rhabdomyolysis. Other and mouth is includes no urinalysis, elevated creatinine over baseline of 1.2 and hyperglycemia. Imaging of shows evidence of stroke and hence a neurological consultation. Patient has history of cognitive relation and was to be on the serosa. It is unclear if he was taking this at time of admission.   GENERAL: The patient is unresponsive.  HEENT: no trauma appreciated and the neck is supple  ABDOMEN: soft  EXTREMITIES: No edema   BACK: normal  SKIN: Normal by inspection.    MENTAL STATUS: lays in bed with eyes partially closed. He does on the eyes to sernal rub. He does not follow commands. There is no verbal output.  CRANIAL NERVES: Pupils are equal, round and reactive to light and accomodation; extra ocular movements are full, there is no significant nystagmus; visual fields are full by direct right; upper and lower  facial muscles are normal in strength and symmetric, there is no flattening of the nasolabial folds.  MOTOR: there is a right hemiplegia Left upper extremity is at least 3 and the left leg 2. There is severe drift involving the left upper extremity and left lower extremity.  COORDINATION: No tremors, dysmetria, clonus or parkinsonism appreciated.  REFLEXES: Deep tendon reflexes are symmetrical and normal.   SENSATION: response pain was similar bilaterally. No neglect appreciated.    NIH stroke scale 2, 2, 3, 2, 2, 2, 2  Total  15.    Blood pressure 111/64, pulse 84, temperature 98 F (36.7 C), temperature source Axillary, resp. rate 14, height '5\' 10"'$  (1.778 m), weight 166 lb 7.2 oz (75.5 kg), SpO2 91 %.  Past Medical History:  Diagnosis Date  . Atrial fibrillation ([place])   . Nonischemic cardiomyopathy ([place])    a. echo in 05/2017 showing reduced EF of 30-35% - cath showing normal cors  . Seizures ([place])     Past Surgical History:  Procedure Laterality Date  . HERNIA REPAIR    . LEFT HEART CATH AND CORONARY ANGIOGRAPHY N/A 06/05/2017   Procedure: LEFT HEART CATH AND CORONARY ANGIOGRAPHY;  Surgeon: Martinique, Peter M, MD;  Location: [facility];  Service: Cardiovascular;  Laterality: N/A;    Family History  Problem Relation Age of Onset  . Heart attack Mother        had AMI in 62s  . AAA (abdominal aortic aneurysm) Neg Hx     Social History:  reports that he has never smoked. He has never used smokeless  tobacco. He reports that he drinks alcohol. He reports that he does not use drugs.  Allergies:  Allergies  Allergen Reactions  . Other     Pt reports "older" medication used for migraines  . Talwin [Pentazocine]     Sweating, rapid heartbeat    Medications: Prior to Admission medications   Medication Sig Start Date End Date Taking? Authorizing Provider  rivaroxaban (XARELTO) 20 MG TABS tablet Take 1 tablet (20 mg total) by mouth daily with supper. 10/08/17  Yes  Strader, Draper, PA-C  losartan (COZAAR) 25 MG tablet Take 1 tablet (25 mg total) by mouth daily. 07/25/17   Josue Hector, MD  metoprolol succinate (TOPROL-XL) 100 MG 24 hr tablet Take 1 tablet (100 mg total) by mouth daily. 10/30/17   Strader, Fransisco Hertz, PA-C  metoprolol tartrate (LOPRESSOR) 50 MG tablet Take 1 tablet (50 mg total) by mouth 2 (two) times daily. 05/21/17 08/19/17  Josue Hector, MD    Scheduled Meds: . Chlorhexidine Gluconate Cloth  6 each Topical [id number]  . insulin aspart  0-9 Units Subcutaneous TID WC  . levalbuterol  0.63 mg Nebulization Q6H  . mouth rinse  15 mL Mouth Rinse BID  . metoprolol tartrate  2.5 mg Intravenous Q8H  . mupirocin ointment  1 application Nasal BID  . mupirocin ointment   Topical Daily  . sodium chloride flush  3 mL Intravenous Q12H   Continuous Infusions: . dextrose 5 % and 0.45% NaCl 50 mL/hr at 12/19/17 1612  . famotidine (PEPCID) IV Stopped (12/18/17 2206)  . meropenem (MERREM) IV Stopped (12/19/17 1604)   PRN Meds:.acetaminophen **OR** acetaminophen, bisacodyl, levalbuterol, ondansetron **OR** ondansetron (ZOFRAN) IV     Results for orders placed or performed during the hospital encounter of 12/17/17 (from the past 48 hour(s))  Lactic acid, plasma     Status: Abnormal   Collection Time: 12/17/17  5:59 PM  Result Value Ref Range   Lactic Acid, Venous 3.7 (HH) 0.5 - 1.9 mmol/L    Comment: CRITICAL RESULT CALLED TO, READ BACK BY AND VERIFIED WITH: POINDEXTER,M ON 12/17/17 AT 1940 BY LOY,C Performed at [facility], [address] [place], [place] [address]   Procalcitonin     Status: None   Collection Time: 12/17/17  5:59 PM  Result Value Ref Range   Procalcitonin 46.76 ng/mL    Comment:        Interpretation: PCT >= 10 ng/mL: Important systemic inflammatory response, almost exclusively due to severe bacterial sepsis or septic shock. (NOTE)       Sepsis PCT Algorithm           Lower Respiratory Tract                                       Infection PCT Algorithm    ----------------------------     ----------------------------         PCT < 0.25 ng/mL                PCT < 0.10 ng/mL         Strongly encourage             Strongly discourage   discontinuation of antibiotics    initiation of antibiotics    ----------------------------     -----------------------------       PCT 0.25 - 0.50 ng/mL  PCT 0.10 - 0.25 ng/mL               OR       >80% decrease in PCT            Discourage initiation of                                            antibiotics      Encourage discontinuation           of antibiotics    ----------------------------     -----------------------------         PCT >= 0.50 ng/mL              PCT 0.26 - 0.50 ng/mL                AND       <80% decrease in PCT             Encourage initiation of                                             antibiotics       Encourage continuation           of antibiotics    ----------------------------     -----------------------------        PCT >= 0.50 ng/mL                  PCT > 0.50 ng/mL               AND         increase in PCT                  Strongly encourage                                      initiation of antibiotics    Strongly encourage escalation           of antibiotics                                     -----------------------------                                           PCT <= 0.25 ng/mL                                                 OR                                        > 80% decrease in PCT  Discontinue / Do not initiate                                             antibiotics Performed at [facility], [address], [place], [place] [address]   TSH     Status: None   Collection Time: 12/17/17  5:59 PM  Result Value Ref Range   TSH 1.211 0.350 - 4.500 uIU/mL    Comment: Performed by a 3rd Generation assay with a functional sensitivity of <=0.01 uIU/mL. Performed at [facility], [address] [place], [place] [address]   Hemoglobin A1c     Status: Abnormal   Collection Time: 12/17/17  5:59 PM  Result Value Ref Range   Hgb A1c MFr Bld 5.8 (H) 4.8 - 5.6 %    Comment: (NOTE) Pre diabetes:          5.7%-6.4% Diabetes:              >6.4% Glycemic control for   <7.0% adults with diabetes    Mean Plasma Glucose 119.76 mg/dL    Comment: Performed at [facility] [address] [place], [place] [address]  Troponin I     Status: Abnormal   Collection Time: 12/17/17  5:59 PM  Result Value Ref Range   Troponin I 0.88 (HH) <0.03 ng/mL    Comment: DELTA CHECK NOTED Performed at [facility], [address], [place], [place] [address]   APTT     Status: None   Collection Time: 12/17/17  6:30 PM  Result Value Ref Range   aPTT 29 24 - 36 seconds    Comment: Performed at [facility], [address] [place], [place] [address]  Heparin level (unfractionated)     Status: Abnormal   Collection Time: 12/17/17  6:30 PM  Result Value Ref Range   Heparin Unfractionated <0.10 (L) 0.30 - 0.70 IU/mL    Comment:        IF HEPARIN RESULTS ARE BELOW EXPECTED VALUES, AND PATIENT DOSAGE HAS BEEN CONFIRMED, SUGGEST FOLLOW UP TESTING OF ANTITHROMBIN III LEVELS. Performed at [facility], [address], [place], [place] [address]   Blood culture (routine x 2)     Status: None (Preliminary result)   Collection Time: 12/17/17  7:21 PM  Result Value Ref Range   Specimen Description BLOOD RIGHT ARM DRAWN BY RN    Special Requests      BOTTLES DRAWN AEROBIC AND ANAEROBIC Blood Culture adequate volume   Culture      NO GROWTH 2 DAYS Performed at [facility], [address] [place], [place] [address]    Report Status PENDING   Blood culture (routine x 2)     Status: None (Preliminary result)   Collection Time: 12/17/17  7:21 PM  Result Value Ref Range   Specimen Description BLOOD RIGHT WRIST DRAWN BY RN    Special Requests      BOTTLES DRAWN AEROBIC AND ANAEROBIC  Blood Culture adequate volume   Culture      NO GROWTH 2 DAYS Performed at [facility], [address], [place], [place] [address]    Report Status PENDING   MRSA PCR Screening     Status: Abnormal   Collection Time: 12/17/17  8:08 PM  Result Value Ref Range   MRSA by PCR POSITIVE (A) NEGATIVE    Comment:  The GeneXpert MRSA Assay (FDA approved for NASAL specimens only), is one component of a comprehensive MRSA colonization surveillance program. It is not intended to diagnose MRSA infection nor to guide or monitor treatment for MRSA infections. RESULT CALLED TO, READ BACK BY AND VERIFIED WITH: AMBURN,A. AT 0253 ON 12/18/2017 BY EVA Performed at [facility], [address], [place], [place] [address]   Glucose, capillary     Status: Abnormal   Collection Time: 12/17/17 10:00 PM  Result Value Ref Range   Glucose-Capillary 133 (H) 65 - 99 mg/dL  Urine rapid drug screen (hosp performed)     Status: None   Collection Time: 12/17/17 10:44 PM  Result Value Ref Range   Opiates NONE DETECTED NONE DETECTED   Cocaine NONE DETECTED NONE DETECTED   Benzodiazepines NONE DETECTED NONE DETECTED   Amphetamines NONE DETECTED NONE DETECTED   Tetrahydrocannabinol NONE DETECTED NONE DETECTED   Barbiturates NONE DETECTED NONE DETECTED    Comment: (NOTE) DRUG SCREEN FOR MEDICAL PURPOSES ONLY.  IF CONFIRMATION IS NEEDED FOR ANY PURPOSE, NOTIFY LAB WITHIN 5 DAYS. LOWEST DETECTABLE LIMITS FOR URINE DRUG SCREEN Drug Class                     Cutoff (ng/mL) Amphetamine and metabolites    1000 Barbiturate and metabolites    200 Benzodiazepine                 025 Tricyclics and metabolites     300 Opiates and metabolites        300 Cocaine and metabolites        300 THC                            50 Performed at [facility], [address] [place], [place] [address]   Troponin I     Status: Abnormal   Collection Time: 12/18/17 12:47 AM  Result Value Ref Range   Troponin I 0.65 (HH)  <0.03 ng/mL    Comment: CRITICAL VALUE NOTED.  VALUE IS CONSISTENT WITH PREVIOUSLY REPORTED AND CALLED VALUE. Performed at [facility], [address] [place], [place] [address]   Troponin I     Status: Abnormal   Collection Time: 12/18/17  4:24 AM  Result Value Ref Range   Troponin I 0.54 (HH) <0.03 ng/mL    Comment: CRITICAL VALUE NOTED.  VALUE IS CONSISTENT WITH PREVIOUSLY REPORTED AND CALLED VALUE. Performed at [facility], [address], [place],  [address]   Comprehensive metabolic panel     Status: Abnormal   Collection Time: 12/18/17  4:24 AM  Result Value Ref Range   Sodium 146 (H) 135 - 145 mmol/L   Potassium 4.6 3.5 - 5.1 mmol/L   Chloride 117 (H) 101 - 111 mmol/L   CO2 18 (L) 22 - 32 mmol/L   Glucose, Bld 217 (H) 65 - 99 mg/dL   BUN 98 (H) 6 - 20 mg/dL   Creatinine, Ser 2.58 (H) 0.61 - 1.24 mg/dL   Calcium 7.7 (L) 8.9 - 10.3 mg/dL   Total Protein 6.6 6.5 - 8.1 g/dL   Albumin 2.2 (L) 3.5 - 5.0 g/dL   AST 71 (H) 15 - 41 U/L   ALT 23 17 - 63 U/L   Alkaline Phosphatase 35 (L) 38 - 126 U/L   Total Bilirubin 3.8 (H) 0.3 - 1.2 mg/dL   GFR calc non Af Amer 23 (L) >60 mL/min  GFR calc Af Amer 27 (L) >60 mL/min    Comment: (NOTE) The eGFR has been calculated using the CKD EPI equation. This calculation has not been validated in all clinical situations. eGFR's persistently <60 mL/min signify possible Chronic Kidney Disease.    Anion gap 11 5 - 15    Comment: Performed at [facility], [address] [place], [place] [address]  CBC     Status: Abnormal   Collection Time: 12/18/17  4:24 AM  Result Value Ref Range   WBC 26.6 (H) 4.0 - 10.5 K/uL    Comment: WHITE COUNT CONFIRMED ON SMEAR   RBC 3.37 (L) 4.22 - 5.81 MIL/uL   Hemoglobin 11.0 (L) 13.0 - 17.0 g/dL    Comment: DELTA CHECK NOTED   HCT 32.8 (L) 39.0 - 52.0 %   MCV 97.3 78.0 - 100.0 fL   MCH 32.6 26.0 - 34.0 pg   MCHC 33.5 30.0 - 36.0 g/dL   RDW 13.8 11.5 - 15.5 %   Platelets 38 (L) 150 - 400 K/uL     Comment: SPECIMEN CHECKED FOR CLOTS PLATELET COUNT CONFIRMED BY SMEAR Performed at [facility], [address], [place], [place] [address]   Heparin level (unfractionated)     Status: None   Collection Time: 12/18/17  4:24 AM  Result Value Ref Range   Heparin Unfractionated 0.44 0.30 - 0.70 IU/mL    Comment:        IF HEPARIN RESULTS ARE BELOW EXPECTED VALUES, AND PATIENT DOSAGE HAS BEEN CONFIRMED, SUGGEST FOLLOW UP TESTING OF ANTITHROMBIN III LEVELS. Performed at [facility], [address], [place], [place] [address]   Pathologist smear review     Status: None   Collection Time: 12/18/17  4:24 AM  Result Value Ref Range   Path Review Reviewed By Violet Baldy, M.D.     Comment: 5.8.19 NORMOCYTIC ANEMIA WITH SCATTERED SCHISTOCYTES AND MODERATE POLYCHROMASIA. LEUKOCYTOSIS AND THROMBOCYTOPENIA.  Performed at [facility], [place] [address] [place], [place] [address]   Glucose, capillary     Status: Abnormal   Collection Time: 12/18/17  7:37 AM  Result Value Ref Range   Glucose-Capillary 164 (H) 65 - 99 mg/dL  Glucose, capillary     Status: Abnormal   Collection Time: 12/18/17 11:28 AM  Result Value Ref Range   Glucose-Capillary 180 (H) 65 - 99 mg/dL  Glucose, capillary     Status: Abnormal   Collection Time: 12/18/17  4:02 PM  Result Value Ref Range   Glucose-Capillary 143 (H) 65 - 99 mg/dL  Glucose, capillary     Status: Abnormal   Collection Time: 12/18/17 10:24 PM  Result Value Ref Range   Glucose-Capillary 174 (H) 65 - 99 mg/dL   Comment 1 Notify RN   CBC     Status: Abnormal   Collection Time: 12/19/17  4:21 AM  Result Value Ref Range   WBC 28.2 (H) 4.0 - 10.5 K/uL   RBC 3.42 (L) 4.22 - 5.81 MIL/uL   Hemoglobin 10.8 (L) 13.0 - 17.0 g/dL   HCT 33.3 (L) 39.0 - 52.0 %   MCV 97.4 78.0 - 100.0 fL   MCH 31.6 26.0 - 34.0 pg   MCHC 32.4 30.0 - 36.0 g/dL   RDW 14.3 11.5 - 15.5 %   Platelets 42 (L) 150 - 400 K/uL    Comment: SPECIMEN CHECKED FOR  CLOTS CONSISTENT WITH PREVIOUS RESULT Performed at [facility], [address], [place], [place] [address]  Basic metabolic panel     Status: Abnormal   Collection Time: 12/19/17  4:21 AM  Result Value Ref Range   Sodium 152 (H) 135 - 145 mmol/L   Potassium 4.4 3.5 - 5.1 mmol/L   Chloride 121 (H) 101 - 111 mmol/L   CO2 19 (L) 22 - 32 mmol/L   Glucose, Bld 249 (H) 65 - 99 mg/dL   BUN 92 (H) 6 - 20 mg/dL   Creatinine, Ser 2.15 (H) 0.61 - 1.24 mg/dL   Calcium 8.3 (L) 8.9 - 10.3 mg/dL   GFR calc non Af Amer 29 (L) >60 mL/min   GFR calc Af Amer 34 (L) >60 mL/min    Comment: (NOTE) The eGFR has been calculated using the CKD EPI equation. This calculation has not been validated in all clinical situations. eGFR's persistently <60 mL/min signify possible Chronic Kidney Disease.    Anion gap 12 5 - 15    Comment: Performed at [facility], [address], [place], [place] [address]  Glucose, capillary     Status: Abnormal   Collection Time: 12/19/17  7:58 AM  Result Value Ref Range   Glucose-Capillary 232 (H) 65 - 99 mg/dL  Glucose, capillary     Status: Abnormal   Collection Time: 12/19/17 12:02 PM  Result Value Ref Range   Glucose-Capillary 224 (H) 65 - 99 mg/dL    Studies/Results:      BRAIN MRI MRA FINDINGS: MRI HEAD FINDINGS  Brain: Multifocal abnormal diffusion restriction. The largest areas involve the left anterior and middle cerebral artery territories. There are smaller infarct scattered within the left deep gray structures, right occipital lobe, right temporal lobe, right midbrain and both cerebellar hemispheres. Extensive hyperintense T2-weighted signal corresponds to the areas of ischemia. There is no midline shift. No acute hemorrhage. No mass lesion. No chronic microhemorrhage or cerebral amyloid angiopathy. No hydrocephalus, age advanced atrophy or lobar predominant volume loss. No dural abnormality or extra-axial collection.  Skull and upper cervical  spine: The visualized skull base, calvarium, upper cervical spine and extracranial soft tissues are normal.  Sinuses/Orbits: Moderate left maxillary sinus mucosal disease. Small amount of bilateral mastoid fluid. Normal orbits.  MRA HEAD FINDINGS  Intracranial internal carotid arteries: Normal.  Anterior cerebral arteries: The left anterior cerebral arteries occluded of the proximal A2 segment. Normal right ACA.  Middle cerebral arteries: There is occlusion of the left middle cerebral artery inferior division. Superior division and M1 segment are normal. Right MCA is normal.  Posterior communicating arteries: Present bilaterally.  Posterior cerebral arteries: Bilateral fetal origins.  Normal.  Basilar artery: Normal.  Vertebral arteries: Left dominant. Distal right vertebral artery appears occluded or severely stenotic.  Superior cerebellar arteries: Normal.  Anterior inferior cerebellar arteries: Normal.  Posterior inferior cerebellar arteries: Nonvisualization of right PICA. Normal left.  IMPRESSION: 1. 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 No acute hemorrhage or herniation. 2. Multiple small foci of acute ischemia within the left basal ganglia, left thalamus, right occipital lobe, right temporal lobe, right midbrain and bilaterally within the cerebellum. 3. 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 4. Occlusion of the proximal to midportion of the left anterior cerebral artery A2 segment.    ECHO- Left ventricle: The cavity size was normal. Wall thickness was   normal. Systolic function was mildly to moderately reduced. The   estimated ejection fraction was in the range of 40% to 45%.  Doppler parameters are consistent with abnormal left ventricular   relaxation (grade 1 diastolic dysfunction). - Aortic valve: Mildly calcified  annulus. Trileaflet; normal   thickness leaflets. Valve area (VTI): 3.01 cm^2. Valve area   (Vmax): 2.6 cm^2. - Mitral valve: Mildly calcified annulus. Normal thickness leaflets   . There was mild regurgitation. - Left atrium: The atrium was mildly dilated. - Limited echo to evaluate LV function.          THE BRAIN MRI MRA REVIEWED IMPRESSION.   The cerebellum shows multiple infarcts extending from the mid to the upper /appear regions on both sides.  There is also small infarct involving the right pontine region, left caudate nuclei and left petit min.  There is a larger left lateral temporal lesion and also medial frontal and the paramedian/parasagittal infarct consistent with the ACA distribution infarct.  These are all seen on DWI  With increased signal and reduced signal on the corresponding ADC scan consistent with acute ischemic strokes.   FLAIR imaging shows mild periventricular white matter disease.  No hemorrhages appreciated.  MRA shows occluded proximal left ACA and the inferior branch of the M2 segment.  There is also absent right vertebral.     Moe Graca A. Merlene Laughter, M.D.  Diplomate, Tax adviser of Psychiatry and Neurology ( Neurology). 12/19/2017, 5:07 PM

## 2017-12-19 NOTE — Consult Note (Addendum)
Consultation Note Date: 12/19/2017   Patient Name: Juan Barber  DOB: 1946/06/27  MRN: [id number]  Age / Sex: 72 y.o., male  PCP: Juan Beams, MD Referring Physician: Catarina Hartshorn, MD  Reason for Consultation: Establishing goals of care and Psychosocial/spiritual support  HPI/Patient Profile: 72 y.o. 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   Clinical Assessment and Goals of Care: Juan Barber is resting quietly in bed.  He does not try to interact with me in any meaningful way.  He will grimace and briefly open his left eye with a sternal rub.  Present today at bedside is daughter, Juan Barber and son Juan Barber.  Present also is Juan Barber former wife Juan Barber and her husband.  Present for today's family meeting is chaplain Juan Barber.  Juan Barber, chaplain Juan Barber and I go to my office for a family meeting.  We review Juan Barber's chronic and acute health history.  We reviewed heart testing completed October 2018.   Family states that he had no medication in his home when they went to look for prescriptions to bring to the hospital.  We reviewed functional status.  They share that Juan Barber had been able to do his own bathing and dressing, light housework.  They share that neighbor and friend Juan Barber took him to doctor's appointments, neighbors and friends assisted with getting groceries.  We talked in detail about the severity of Juan Barber stroke.  We  talked about concerns for continued declines including aspiration pneumonias, problems related to immobility.  We talked about some "what if's".  We talked about artificial hydration and nutrition, PEG tube feeding.  I encouraged family to consider if they would request a PEG tube, if Juan Barber is not improving with his mental state.  We talked about how to make choices for loved ones including 1) keeping them at the center of decision-making, 2) are we doing something for them or to them, (can we change what is happening)  3) what would the person Juan Barber was 5 years ago tell them about his current health state.  I asked family to consider if Juan Barber would ever want to live the rest of his life in a nursing home.  We talked about the concept of let nature take its course.  I provided an example.  We talked about how we would continue to care for Juan Barber if we were not focusing on aggressive care.  We talked in detail about healthcare power of attorney, see below. We talked in detail about CODE STATUS, see below.  Conference with hospitalist, Dr. Arbutus Barber.  Page from [facility] staff requesting return phone call to neighbor and friend, Juan Barber, Juan Barber..  Call to Juan Barber cell and work number, left voicemail message on cell number.  Juan Barber returns call relatively quickly.  I share with Juan Barber that he is listed as emergency contact and Juan Barber chart.  I shared that he has the right to call and get information from nursing staff when needed.  We talked about Juan Barber's massive stroke.  We talked about possibilities for future outcomes.  Juan Barber endorses that Juan Barber would not want to live in a nursing home and be fed by tube.  He states that Juan Barber has been a good friend and neighbor for approximately 15 years.  Juan Barber shares that he heard Juan Barber speak of his daughter Juan Barber regularly, and only mention his son Juan Barber twice in 15 years.  We talked about healthcare power of attorney laws.  I share some of our  discussion from the family meeting, and encourage Juan Barber to be a support for Juan Barber and Juan Barber, sharing his love for Juan Barber.  Healthcare power of attorney NEXT OF KIN -daughter Juan Barber from [place], son Juan Barber from [place].  Juan Barber states that her father told her he had completed healthcare power of attorney but they could not find paperwork in his home.   SUMMARY OF RECOMMENDATIONS   24 to 48 hours for outcomes. Family is to discuss CODE STATUS and come to a consensus, share this with medical team.  Code Status/Advance Care Planning:  Full code -we talked about the concept of treat the treatable but no CPR, no intubation (allow a natural death).  I shared that this is the recommendation of the medical team (DNR).  Juan Barber states that she and her father had end-of-life discussions after his latest health concern, a fall with a broken back and wrist.  Juan Barber states that her father expressed his desire for DNR status, which she can abide.  At this point, son Juan Barber states he needs more time to think about DNR status.  I encouraged family to have discussion and prayerful time related to CODE STATUS and let the medical team know their choice as soon as possible.  Symptom Management:   Per hospitalist no additional needs at this time.  Palliative Prophylaxis:   Aspiration and Turn Reposition  Additional Recommendations (Limitations, Scope, Preferences):  Full Scope Treatment  Psycho-social/Spiritual:   Desire for further Chaplaincy support:yes  Additional Recommendations: Caregiving  Support/Resources and Education on Hospice  Prognosis:   Unable to determine, based on outcomes.  4 weeks or less would not be surprising based on severity of stroke, inability to safely take by mouth nutrition, risk for continued infection.  Also, several months (6 to 18 months) would not be surprising if family elects PEG tube for nutrition.  Discharge Planning: To be determined,  based on outcomes and family decisions.      Primary Diagnoses: Present on Admission: . Closed Smith's fracture of right radius . Persistent atrial fibrillation (HCC) . Chronic systolic CHF (congestive heart failure) (HCC) . Dilated cardiomyopathy (HCC) . Acute renal failure due to rhabdomyolysis (HCC) . Elevated troponin . Traumatic rhabdomyolysis (HCC) . Thrombocytopenia (HCC)   I have reviewed the medical record, interviewed the patient and family, and examined the patient. The following aspects are pertinent.  Past Medical History:  Diagnosis Date  . Atrial fibrillation (HCC)   . Nonischemic cardiomyopathy (HCC)    a. echo in 05/2017 showing reduced EF of 30-35% -  cath showing normal cors  . Seizures (HCC)    Social History   Socioeconomic History  . Marital status: Divorced    Spouse name: Not on file  . Number of children: Not on file  . Years of education: Not on file  . Highest education level: Not on file  Occupational History  . Not on file  Social Needs  . Financial resource strain: Not on file  . Food insecurity:    Worry: Not on file    Inability: Not on file  . Transportation needs:    Medical: Not on file    Non-medical: Not on file  Tobacco Use  . Smoking status: Never Smoker  . Smokeless tobacco: Never Used  Substance and Sexual Activity  . Alcohol use: Yes    Comment: occassional  . Drug use: No  . Sexual activity: Not on file  Lifestyle  . Physical activity:    Days per week: Not on file    Minutes per session: Not on file  . Stress: Not on file  Relationships  . Social connections:    Talks on phone: Not on file    Gets together: Not on file    Attends religious service: Not on file    Active member of club or organization: Not on file    Attends meetings of clubs or organizations: Not on file    Relationship status: Not on file  Other Topics Concern  . Not on file  Social History Narrative  . Not on file   Family History    Problem Relation Age of Onset  . Heart attack Mother        had AMI in 63s  . AAA (abdominal aortic aneurysm) Neg Hx    Scheduled Meds: . Chlorhexidine Gluconate Cloth  6 each Topical Q0600  . insulin aspart  0-15 Units Subcutaneous TID WC  . levalbuterol  0.63 mg Nebulization Q6H  . mouth rinse  15 mL Mouth Rinse BID  . methylPREDNISolone (SOLU-MEDROL) injection  40 mg Intravenous Q12H  . metoprolol tartrate  2.5 mg Intravenous Q8H  . mupirocin ointment  1 application Nasal BID  . mupirocin ointment   Topical Daily  . sodium chloride flush  3 mL Intravenous Q12H   Continuous Infusions: . ampicillin-sulbactam (UNASYN) IV Stopped (12/19/17 1610)  . famotidine (PEPCID) IV Stopped (12/18/17 2206)   PRN Meds:.acetaminophen **OR** acetaminophen, bisacodyl, levalbuterol, ondansetron **OR** ondansetron (ZOFRAN) IV Medications Prior to Admission:  Prior to Admission medications   Medication Sig Start Date End Date Taking? Authorizing Provider  rivaroxaban (XARELTO) 20 MG TABS tablet Take 1 tablet (20 mg total) by mouth daily with supper. 10/08/17  Yes Strader, [place] M, PA-C  losartan (COZAAR) 25 MG tablet Take 1 tablet (25 mg total) by mouth daily. 07/25/17   Wendall Stade, MD  metoprolol succinate (TOPROL-XL) 100 MG 24 hr tablet Take 1 tablet (100 mg total) by mouth daily. 10/30/17   Strader, Lennart Pall, PA-C  metoprolol tartrate (LOPRESSOR) 50 MG tablet Take 1 tablet (50 mg total) by mouth 2 (two) times daily. 05/21/17 08/19/17  Wendall Stade, MD   Allergies  Allergen Reactions  . Other     Pt reports "older" medication used for migraines  . Talwin [Pentazocine]     Sweating, rapid heartbeat   Review of Systems  Unable to perform ROS: Mental status change    Physical Exam  Constitutional: No distress.  Appears acutely ill, no meaningful interactions or movements  HENT:  Head: Atraumatic.  Cardiovascular: Normal rate.  Pulmonary/Chest: Effort normal. No respiratory  distress.  Abdominal: Soft. He exhibits no distension.  Musculoskeletal: He exhibits no edema.  Neurological:  Does not open eyes or follow commands  Skin: Skin is warm and dry.  Large area of hard skin left chest below nipple  Psychiatric:  No meaningful interactions  Nursing note and vitals reviewed.   Vital Signs: BP 108/77   Pulse (!) 103   Temp 98 F (36.7 C) (Axillary)   Resp (!) 21   Ht 5\' 10"  (1.778 m)   Wt 75.5 kg (166 lb 7.2 oz)   SpO2 100%   BMI 23.88 kg/m  Pain Scale: CPOT POSS *See Group Information*: S-Acceptable,Sleep, easy to arouse Pain Score: 0-No pain   SpO2: SpO2: 100 % O2 Device:SpO2: 100 % O2 Flow Rate: .O2 Flow Rate (L/min): 2 L/min  IO: Intake/output summary:   Intake/Output Summary (Last 24 hours) at 12/19/2017 1314 Last data filed at 12/19/2017 1034 Gross per 24 hour  Intake 356 ml  Output 1800 ml  Net -1444 ml    LBM: Last BM Date: (UTA) Baseline Weight: Weight: 79.2 kg (174 lb 8 oz) Most recent weight: Weight: 75.5 kg (166 lb 7.2 oz)     Palliative Assessment/Data:   Flowsheet Rows     Most Recent Value  Intake Tab  Referral Department  Hospitalist  Unit at Time of Referral  ICU  Palliative Care Primary Diagnosis  Neurology  Date Notified  12/18/17  Palliative Care Type  New Palliative care  Date of Admission  12/17/17  Date first seen by Palliative Care  12/18/17  # of days Palliative referral response time  0 Day(s)  # of days IP prior to Palliative referral  1  Clinical Assessment  Palliative Performance Scale Score  10%  Pain Max last 24 hours  Not able to report  Pain Min Last 24 hours  Not able to report  Dyspnea Max Last 24 Hours  Not able to report  Dyspnea Min Last 24 hours  Not able to report  Psychosocial & Spiritual Assessment  Palliative Care Outcomes  Patient/Family meeting held?  Yes  Who was at the meeting?  Daughter Juan Barber and son Juan Barber at bedside  Palliative Care Outcomes  Provided psychosocial or  spiritual support      Time In: 1015 - 1310 Time Out: 1145 - 1340 Time Total: 90 + 30 = 120 minutes Greater than 50%  of this time was spent counseling and coordinating care related to the above assessment and plan.  Signed by: Katheran Awe, NP   Please contact Palliative Medicine Team phone at [phone] for questions and concerns.  For individual provider: See Loretha Stapler

## 2017-12-19 NOTE — Plan of Care (Signed)
  Problem: Acute Rehab PT Goals(only PT should resolve) Goal: Pt Will Go Supine/Side To Sit Outcome: Progressing Flowsheets (Taken 12/19/2017 1229) Pt will go Supine/Side to Sit: with moderate assist Goal: Patient Will Perform Sitting Balance Outcome: Progressing Flowsheets (Taken 12/19/2017 1229) Patient will perform sitting balance: with moderate assist Goal: Patient Will Transfer Sit To/From Stand Outcome: Progressing Flowsheets (Taken 12/19/2017 1229) Patient will transfer sit to/from stand: with maximum assist Goal: Pt Will Transfer Bed To Chair/Chair To Bed Outcome: Progressing Flowsheets (Taken 12/19/2017 1229) Pt will Transfer Bed to Chair/Chair to Bed: with max assist Goal: Pt Will Ambulate Outcome: Progressing Flowsheets (Taken 12/19/2017 1229) Pt will Ambulate: 10 feet;with maximum assist;with rolling walker  12:29 PM, 12/19/17 [name], MPT Physical Therapist with [facility] [phone] office [phone] mobile phone

## 2017-12-20 ENCOUNTER — Inpatient Hospital Stay (HOSPITAL_COMMUNITY): Payer: Medicare Other

## 2017-12-20 LAB — GLUCOSE, CAPILLARY
GLUCOSE-CAPILLARY: 244 mg/dL — AB (ref 65–99)
GLUCOSE-CAPILLARY: 251 mg/dL — AB (ref 65–99)
GLUCOSE-CAPILLARY: 282 mg/dL — AB (ref 65–99)
Glucose-Capillary: 271 mg/dL — ABNORMAL HIGH (ref 65–99)
Glucose-Capillary: 280 mg/dL — ABNORMAL HIGH (ref 65–99)
Glucose-Capillary: 194 mg/dL — ABNORMAL HIGH (ref 65–99)
Glucose-Capillary: 169 mg/dL — ABNORMAL HIGH (ref 65–99)

## 2017-12-20 LAB — CBC
HCT: 32.9 % — ABNORMAL LOW (ref 39.0–52.0)
Hemoglobin: 10.9 g/dL — ABNORMAL LOW (ref 13.0–17.0)
MCH: 32.2 pg (ref 26.0–34.0)
MCHC: 33.1 g/dL (ref 30.0–36.0)
MCV: 97.1 fL (ref 78.0–100.0)
Platelets: 30 10*3/uL — ABNORMAL LOW (ref 150–400)
RBC: 3.39 MIL/uL — ABNORMAL LOW (ref 4.22–5.81)
RDW: 14.5 % (ref 11.5–15.5)
WBC: 34.4 10*3/uL — AB (ref 4.0–10.5)

## 2017-12-20 LAB — PROTIME-INR
INR: 1.25
PROTHROMBIN TIME: 15.6 s — AB (ref 11.4–15.2)

## 2017-12-20 LAB — COMPREHENSIVE METABOLIC PANEL
ALK PHOS: 38 U/L (ref 38–126)
ALT: 22 U/L (ref 17–63)
AST: 31 U/L (ref 15–41)
Albumin: 2.2 g/dL — ABNORMAL LOW (ref 3.5–5.0)
Anion gap: 9 (ref 5–15)
BUN: 86 mg/dL — AB (ref 6–20)
CALCIUM: 8.6 mg/dL — AB (ref 8.9–10.3)
CHLORIDE: 128 mmol/L — AB (ref 101–111)
CO2: 21 mmol/L — ABNORMAL LOW (ref 22–32)
Creatinine, Ser: 1.96 mg/dL — ABNORMAL HIGH (ref 0.61–1.24)
GFR calc Af Amer: 38 mL/min — ABNORMAL LOW (ref >60)
GFR, EST NON AFRICAN AMERICAN: 32 mL/min — AB (ref >60)
Glucose, Bld: 309 mg/dL — ABNORMAL HIGH (ref 65–99)
Potassium: 3.9 mmol/L (ref 3.5–5.1)
Sodium: 158 mmol/L — ABNORMAL HIGH (ref 135–145)
Total Bilirubin: 1.3 mg/dL — ABNORMAL HIGH (ref 0.3–1.2)
Total Protein: 6.6 g/dL (ref 6.5–8.1)

## 2017-12-20 LAB — URINE CULTURE: Culture: >=100000 — AB

## 2017-12-20 LAB — VITAMIN B12: Vitamin B-12: 2095 pg/mL — ABNORMAL HIGH (ref 180–914)

## 2017-12-20 LAB — HEMOGLOBIN A1C
Hgb A1c MFr Bld: 5.9 % — ABNORMAL HIGH (ref 4.8–5.6)
Mean Plasma Glucose: 122.63 mg/dL

## 2017-12-20 LAB — CK: CK TOTAL: 145 U/L (ref 49–397)

## 2017-12-20 MED ORDER — INSULIN ASPART 100 UNIT/ML ~~LOC~~ SOLN
0.0000 [IU] | SUBCUTANEOUS | Status: DC
  Administered 2017-12-20: 3 [IU] via SUBCUTANEOUS
  Administered 2017-12-20: 8 [IU] via SUBCUTANEOUS
  Administered 2017-12-20: 3 [IU] via SUBCUTANEOUS
  Administered 2017-12-20: 8 [IU] via SUBCUTANEOUS
  Administered 2017-12-21: 2 [IU] via SUBCUTANEOUS
  Administered 2017-12-21: 3 [IU] via SUBCUTANEOUS
  Administered 2017-12-21: 8 [IU] via SUBCUTANEOUS
  Administered 2017-12-21 (×2): 3 [IU] via SUBCUTANEOUS
  Administered 2017-12-21 – 2017-12-22 (×5): 5 [IU] via SUBCUTANEOUS
  Administered 2017-12-22 – 2017-12-23 (×5): 8 [IU] via SUBCUTANEOUS

## 2017-12-20 MED ORDER — METOPROLOL TARTRATE 5 MG/5ML IV SOLN
5.0000 mg | Freq: Once | INTRAVENOUS | Status: AC
  Administered 2017-12-20: 5 mg via INTRAVENOUS

## 2017-12-20 MED ORDER — DEXTROSE-NACL 5-0.45 % IV SOLN
INTRAVENOUS | Status: DC
  Administered 2017-12-20 – 2017-12-21 (×2): via INTRAVENOUS

## 2017-12-20 MED ORDER — SODIUM CHLORIDE 0.9 % IV SOLN
1.0000 g | Freq: Two times a day (BID) | INTRAVENOUS | Status: DC
  Administered 2017-12-20 – 2017-12-21 (×3): 1 g via INTRAVENOUS
  Filled 2017-12-20 (×9): qty 1

## 2017-12-20 MED ORDER — METOPROLOL TARTRATE 5 MG/5ML IV SOLN
2.5000 mg | Freq: Four times a day (QID) | INTRAVENOUS | Status: DC
  Administered 2017-12-21 (×2): 2.5 mg via INTRAVENOUS
  Filled 2017-12-20 (×2): qty 5

## 2017-12-20 MED ORDER — METOPROLOL TARTRATE 5 MG/5ML IV SOLN
INTRAVENOUS | Status: AC
  Filled 2017-12-20: qty 5

## 2017-12-20 NOTE — Progress Notes (Signed)
Pharmacy Antibiotic Note  Juan Barber is a 72 y.o. male admitted on 12/17/2017 with UTI.  Pharmacy has been consulted for Meropenem dosing.  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  Plan: Increase  Meropenem 1gm  IV q12hrs (renally adjusted) Monitor labs, progress, c/s  Antimicrobials this admission: Zithromax, Vancomycin, Rocephin, and Cefepime x 1 on 5/6 Unasyn 5/7 >> 5/8 Meropenem 5/8 >>   Height: 5\' 10"  (177.8 cm) Weight: 164 lb 14.5 oz (74.8 kg) IBW/kg (Calculated) : 73  Temp (24hrs), Avg:97.9 F (36.6 C), Min:97.6 F (36.4 C), Max:98.2 F (36.8 C)  Recent Labs  Lab 12/17/17 1105 12/17/17 1650 12/17/17 1759 12/18/17 0424 12/19/17 0421 12/20/17 0439  WBC 6.1  --   --  26.6* 28.2* 34.4*  CREATININE 2.44*  --   --  2.58* 2.15* 1.96*  LATICACIDVEN  --  6.1* 3.7*  --   --   --     Estimated Creatinine Clearance: 35.2 mL/min (A) (by C-G formula based on SCr of 1.96 mg/dL (H)).    Allergies  Allergen Reactions  . Other     Pt reports "older" medication used for migraines  . Talwin [Pentazocine]     Sweating, rapid heartbeat   Dose adjustments this admission: 5/9 increaseMeropenem renally adjusted due to clcr ~ 18ml/min  Microbiology results: 5/6 BCx: pending 5/6 UCx: > 100K col klebsiella oxytoca 5/6 MRSA PCR: positive  Thank you for allowing pharmacy to be a part of this patient's care.  Elder Cyphers, BS Loura Back, BCPS Clinical Pharmacist Pager [phone]  12/20/2017  Recent Results (from the past 240 hour(s))  Urine culture     Status: Abnormal (Preliminary result)   Collection Time: 12/17/17 11:05 AM  Result Value Ref Range Status   Specimen Description   Final    URINE, RANDOM Performed at [facility], [address], [place], [place] [address]    Special Requests   Final    NONE Performed at [facility], [address] [place], [place] [address]    Culture >=100,000 COLONIES/mL KLEBSIELLA OXYTOCA (A)  Final   Report Status PENDING  Incomplete  Blood  culture (routine x 2)     Status: None (Preliminary result)   Collection Time: 12/17/17  7:21 PM  Result Value Ref Range Status   Specimen Description BLOOD RIGHT ARM DRAWN BY RN  Final   Special Requests   Final    BOTTLES DRAWN AEROBIC AND ANAEROBIC Blood Culture adequate volume   Culture   Final    NO GROWTH 3 DAYS Performed at [facility], [address], [place], [place] [address]    Report Status PENDING  Incomplete  Blood culture (routine x 2)     Status: None (Preliminary result)   Collection Time: 12/17/17  7:21 PM  Result Value Ref Range Status   Specimen Description BLOOD RIGHT WRIST DRAWN BY RN  Final   Special Requests   Final    BOTTLES DRAWN AEROBIC AND ANAEROBIC Blood Culture adequate volume   Culture   Final    NO GROWTH 3 DAYS Performed at [facility], [address], [place], [place] [address]    Report Status PENDING  Incomplete  MRSA PCR Screening     Status: Abnormal   Collection Time: 12/17/17  8:08 PM  Result Value Ref Range Status   MRSA by PCR POSITIVE (A) NEGATIVE Final    Comment:        The GeneXpert MRSA Assay (FDA approved for NASAL specimens only), is one  component of a comprehensive MRSA colonization surveillance program. It is not intended to diagnose MRSA infection nor to guide or monitor treatment for MRSA infections. RESULT CALLED TO, READ BACK BY AND VERIFIED WITH: AMBURN,A. AT 1610 ON 12/18/2017 BY EVA Performed at [facility], [address] [place], [place] [address]     Tera Mater 12/20/2017 8:30 AM

## 2017-12-20 NOTE — Progress Notes (Signed)
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 Neighbors had not seen him since 12/13/17 and the police was present at his apartment for another reason and the apartment staff decided to check on the patient. They found him down on the floor unresponsive covered in urine. Brought into the emergency department where he was noted to be in atrial fibrillation with rapid ventricular response. He was given multiple medications including diltiazem and Lopressor to improve his heart rate. Ultimately was started on a diltiazem drip. After that his blood pressure dropped when he spiked a temperature to 1.9 in the emergency department. Blood pressure had been in the 180s systolic and then dropped to the 78/47 range. Received 4 L of IV fluid and his blood pressure has improved to the mid to low 100s currently 107/62. The patient was found to have markedly infected urine, and elevated troponin, elevated blood glucoses, and elevated bilirubin, and evaded CPK.  The patient was given vancomycin and cefepime in the emergency department.  Chest x-ray showed mild lingular and bilateral lower lobe opacities.  He was given adenosine 6 mg and subsequently 12 mg.  He was ultimately started on diltiazem drip.  Cardiology was consulted for elevated troponins.  They felt this to be due to demand ischemia.    Assessment/Plan: Sepsis -Secondary to UTI and aspiration pneumonia -Lactic acid peaked at 6.1 -Procalcitonin to 46.76 -continue merrem -WBC increased--suspect may be due to steroids  Acute metabolic encephalopathy -Multifactorial including sepsis, stroke, acute on chronic renal failure,  hypernatremia -Patient more awake, but aphasic from stroke -EEG--moderate diffuse slowing  UTI--Klebsiella -Continue Meropenem  Aspiration pneumonia -Continue meropenem -personally reviewed CXR--bilateral lower lobe opacities  Acute embolic stroke -03/14/1913 MRI brain--large acute left ACA territory infarct and a left MCA infarct; multiple foci of acute ischemia in the left basal ganglia, left thalamus, right occipital lobe, right temporal lobe, right midbrain, and bilateral cerebellum -Neurology consult -12/18/2017 echo EF 40-45%, grade 1 DD, mild MR  Atrial fibrillation with RVR -Patient has converted to sinus rhythm -Continue IV metoprolol -Currently a poor candidate for anticoagulation, but defer to cardiology and neurology  Rhabdomyolysis, traumatic -CPK peaked at 898>>145  Thrombocytopenia -Secondary to sepsis -Holding Lovenox and heparin -Serum B12 -TSH 1.211 -PTT 29 -Check INR--1.25 -Check fibrinogen  Acute on chronic renal failure--CKD stage III -Secondary to volume depletion, sepsis, and rhabdomyolysis -Baseline creatinine 1.2-1.4 -Serum creatinine peaked to 2.58  Elevated troponin -Appreciate cardiology consult -Felt to be due to demand ischemia  Neck contusion -MRI cervical spine--negative for fracture.  Cervical prevertebral edema more in the superficial soft tissues of the left lateral neck favoring posttraumatic etiology; no soft tissue edema suggestive of ligamentous injury  Hypernatremia -increase hypotonic fluid  Goals of Care -appreciate palliative care consult -overall poor prognosis with low likelihood of recover to pre-morbid condition -12/20/17--discussed with family-->change to DNR -Advance care planning, including the explanation and discussion of advance directives was carried out with the patient and family.  Code status including explanations of "Full Code" and "DNR" and alternatives were discussed in detail.  Discussion of  end-of-life issues including but not limited palliative care, hospice care and the concept of hospice, other end-of-life care options,  power of attorney for health care decisions, living wills, and physician orders for life-sustaining treatment were also discussed with the patient and family.  Total face to face time 20 minutes.   The patient is critically ill with multiple organ systems failure and requires high complexity decision making for assessment and support, frequent evaluation and titration of therapies, application of advanced monitoring technologies and extensive interpretation of multiple databases.  Critical care time - 45 mins.      Disposition Plan:   SNF when stable Family Communication:   Daughter updated at bedside 5/9  Consultants:  Palliative medicine  Code Status:  FULL   DVT Prophylaxis: SCDs   Procedures: As Listed in Progress Note Above  Antibiotics: vanco 5/6 Cefepime 5/6 azithro 5/6 Unasyn 5/7>>>5/8 merrem 5/8>>>      Subjective: Patient is awake and opens eyes.  He is nonverbal.  There are no reports of vomiting, respiratory distress, diarrhea, uncontrolled pain.  Objective: Vitals:   12/20/17 0600 12/20/17 0630 12/20/17 0700 12/20/17 0826  BP: (!) 112/102 133/88 128/88   Pulse: 81 83 83   Resp: 15 12 13    Temp:      TempSrc:      SpO2: 96% 98% 98% 98%  Weight:      Height:        Intake/Output Summary (Last 24 hours) at 12/20/2017 1105 Last data filed at 12/20/2017 0924 Gross per 24 hour  Intake 103 ml  Output 400 ml  Net -297 ml   Weight change: -0.7 kg (-1 lb 8.7 oz) Exam:   General:  Pt is alert, does notfollow command appropriately, not in acute distress  HEENT: No icterus, No thrush, No neck mass, [employer]/AT  Cardiovascular: RRR, S1/S2, no rubs, no gallops  Respiratory: Bibasilar rales.  No wheezing.  Good air movement.  Abdomen: Soft/+BS, non tender, non distended, no guarding  Extremities: No edema, No  lymphangitis, No petechiae, No rashes, no synovitis   Data Reviewed: I have personally reviewed following labs and imaging studies Basic Metabolic Panel: Recent Labs  Lab 12/17/17 1105 12/17/17 1146 12/18/17 0424 12/19/17 0421 12/20/17 0439  NA 142  --  146* 152* 158*  K 4.7  --  4.6 4.4 3.9  CL 105  --  117* 121* 128*  CO2 22  --  18* 19* 21*  GLUCOSE 172*  --  217* 249* 309*  BUN 105*  --  98* 92* 86*  CREATININE 2.44*  --  2.58* 2.15* 1.96*  CALCIUM 9.1  --  7.7* 8.3* 8.6*  MG  --  2.8*  --   --   --    Liver Function Tests: Recent Labs  Lab 12/17/17 1105 12/18/17 0424 12/20/17 0439  AST 74* 71* 31  ALT 30 23 22   ALKPHOS 45 35* 38  BILITOT 3.5* 3.8* 1.3*  PROT 8.4* 6.6 6.6  ALBUMIN 3.0* 2.2* 2.2*   No results for input(s): LIPASE, AMYLASE in the last 168 hours. No results for input(s): AMMONIA in the last 168 hours. Coagulation Profile: Recent Labs  Lab 12/20/17 0439  INR 1.25   CBC: Recent Labs  Lab 12/17/17 1105 12/18/17 0424 12/19/17 0421 12/20/17 0439  WBC 6.1 26.6* 28.2* 34.4*  HGB 13.8 11.0* 10.8* 10.9*  HCT 41.4 32.8* 33.3* 32.9*  MCV 97.4 97.3 97.4 97.1  PLT 60* 38* 42* 30*   Cardiac Enzymes: Recent Labs  Lab 12/17/17 1105 12/17/17 1759 12/18/17 0047 12/18/17 0424 12/20/17 0439  CKTOTAL 898*  --   --   --  145  TROPONINI 0.90* 0.88* 0.65* 0.54*  --    BNP: Invalid input(s): POCBNP CBG: Recent Labs  Lab 12/19/17 1945 12/20/17 0026 12/20/17 0350 12/20/17 0751 12/20/17 0918  GLUCAP 241* 244* 282* 271* 280*   HbA1C: Recent Labs    12/17/17 1759  HGBA1C 5.8*   Urine analysis:    Component Value Date/Time   COLORURINE AMBER (A) 12/17/2017 1105   APPEARANCEUR CLOUDY (A) 12/17/2017 1105   LABSPEC 1.015 12/17/2017 1105   PHURINE 5.0 12/17/2017 1105   GLUCOSEU NEGATIVE 12/17/2017 1105   HGBUR LARGE (A) 12/17/2017 1105   BILIRUBINUR NEGATIVE 12/17/2017 1105   KETONESUR 5 (A) 12/17/2017 1105   PROTEINUR 30 (A) 12/17/2017  1105   NITRITE NEGATIVE 12/17/2017 1105   LEUKOCYTESUR MODERATE (A) 12/17/2017 1105   Sepsis Labs: @LABRCNTIP (procalcitonin:4,lacticidven:4) ) Recent Results (from the past 240 hour(s))  Urine culture     Status: Abnormal   Collection Time: 12/17/17 11:05 AM  Result Value Ref Range Status   Specimen Description   Final    URINE, RANDOM Performed at [facility], [address], [place], [place] [address]    Special Requests   Final    NONE Performed at [facility], [address] [place], [place] [address]    Culture >=100,000 COLONIES/mL KLEBSIELLA OXYTOCA (A)  Final   Report Status 12/20/2017 FINAL  Final   Organism ID, Bacteria KLEBSIELLA OXYTOCA (A)  Final      Susceptibility   Klebsiella oxytoca - MIC*    AMPICILLIN >=32 RESISTANT Resistant     CEFAZOLIN >=64 RESISTANT Resistant     CEFTRIAXONE <=1 SENSITIVE Sensitive     CIPROFLOXACIN <=0.25 SENSITIVE Sensitive     GENTAMICIN <=1 SENSITIVE Sensitive     IMIPENEM 0.5 SENSITIVE Sensitive     NITROFURANTOIN 64 INTERMEDIATE Intermediate     TRIMETH/SULFA <=20 SENSITIVE Sensitive     AMPICILLIN/SULBACTAM >=32 RESISTANT Resistant     PIP/TAZO >=128 RESISTANT Resistant     Extended ESBL NEGATIVE Sensitive     * >=100,000 COLONIES/mL KLEBSIELLA OXYTOCA  Blood culture (routine x 2)     Status: None (Preliminary result)   Collection Time: 12/17/17  7:21 PM  Result Value Ref Range Status   Specimen Description BLOOD RIGHT ARM DRAWN BY RN  Final   Special Requests   Final    BOTTLES DRAWN AEROBIC AND ANAEROBIC Blood Culture adequate volume   Culture   Final    NO GROWTH 3 DAYS Performed at [facility], [address], [place], [place] [address]    Report Status PENDING  Incomplete  Blood culture (routine x 2)     Status: None (Preliminary result)   Collection Time: 12/17/17  7:21 PM  Result Value Ref Range Status   Specimen Description BLOOD RIGHT WRIST DRAWN BY RN  Final   Special Requests   Final    BOTTLES DRAWN  AEROBIC AND ANAEROBIC Blood Culture adequate volume   Culture   Final    NO GROWTH 3 DAYS Performed at [facility], [address] [place], [place] [address]    Report Status PENDING  Incomplete  MRSA PCR Screening     Status: Abnormal   Collection Time: 12/17/17  8:08 PM  Result Value Ref Range Status   MRSA by PCR POSITIVE (A) NEGATIVE Final    Comment:        The GeneXpert MRSA Assay (FDA approved for NASAL specimens only), is one component of a comprehensive MRSA colonization surveillance program. It  is not intended to diagnose MRSA infection nor to guide or monitor treatment for MRSA infections. RESULT CALLED TO, READ BACK BY AND VERIFIED WITH: AMBURN,A. AT 1610 ON 12/18/2017 BY EVA Performed at [facility], [address] [place], [place] [address]      Scheduled Meds: . Chlorhexidine Gluconate Cloth  6 each Topical Q0600  . insulin aspart  0-15 Units Subcutaneous Q4H  . levalbuterol  0.63 mg Nebulization TID  . mouth rinse  15 mL Mouth Rinse BID  . metoprolol tartrate  2.5 mg Intravenous Q8H  . mupirocin ointment  1 application Nasal BID  . mupirocin ointment   Topical Daily  . sodium chloride flush  3 mL Intravenous Q12H   Continuous Infusions: . dextrose 5 % and 0.45% NaCl 50 mL/hr at 12/19/17 1612  . famotidine (PEPCID) IV Stopped (12/19/17 2225)  . meropenem (MERREM) IV Stopped (12/20/17 1055)    Procedures/Studies: Ct Head Wo Contrast  Result Date: 12/17/2017 CLINICAL DATA:  Found down in his apartment. Laceration to left cheek. EXAM: CT HEAD WITHOUT CONTRAST CT MAXILLOFACIAL WITHOUT CONTRAST CT CERVICAL SPINE WITHOUT CONTRAST TECHNIQUE: Multidetector CT imaging of the head, cervical spine, and maxillofacial structures were performed using the standard protocol without intravenous contrast. Multiplanar CT image reconstructions of the cervical spine and maxillofacial structures were also generated. COMPARISON:  Cervical spine x-rays dated August 03, 2008.  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 No evidence of hemorrhage, hydrocephalus, extra-axial collection or mass lesion/mass effect. Mild to moderate generalized cerebral atrophy. Mild scattered periventricular and subcortical white matter hypodensities are nonspecific, but favored to reflect chronic microvascular ischemic changes. Vascular: Atherosclerotic vascular calcification of the carotid siphons. No hyperdense vessel. Skull: Normal. Negative for fracture or focal lesion. Other: Small left frontal scalp hematoma. CT MAXILLOFACIAL FINDINGS Osseous: No fracture or mandibular dislocation. No destructive process. Periapical lucencies involving the right maxillary first and second molars. Orbits: Negative. No traumatic or inflammatory finding. Sinuses: Chronic appearing mild-to-moderate moderate mucosal thickening of the paranasal sinuses, moderate in the left maxillary sinus. The mastoid air cells are clear. Soft tissues: Left cheek laceration. CT CERVICAL SPINE FINDINGS Alignment: Reversal of the normal cervical lordosis, centered at C5, unchanged. No traumatic malalignment. Skull base and vertebrae: No acute cervical spine fracture. Mild anterior superior endplate height loss at T1 with associated Schmorl's node. No primary bone lesion or focal pathologic process. Soft tissues and spinal canal: Mild prevertebral soft tissue thickening. Subcutaneous stranding and fluid in the left neck. No visible canal hematoma. Disc levels: Moderate to severe disc height loss at C2-C3, C5-C6, and C6-C7, progressed when compared to prior study. Mild bilateral neuroforaminal stenosis at C5-C6 and C6-C7. Upper chest: Negative. Other: 2.9 cm superficial subcutaneous cystic lesion in the right posterior neck, likely a sebaceous cyst. IMPRESSION: 1. 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 2. No acute cervical spine fracture.  Mild prevertebral soft tissue thickening and subcutaneous stranding/fluid in the left neck. Ligamentous and/or soft tissue injury is not excluded. Recommend MRI cervical spine for further evaluation. 3. Mild anterior superior endplate height loss at T1 with associated Schmorl's node is favored chronic. 4.  No acute facial fracture. 5. Small left frontal scalp hematoma.  Left cheek laceration. Electronically Signed   By: Obie Dredge M.D.   On: 12/17/2017 12:06   Ct Cervical Spine Wo Contrast  Result Date: 12/17/2017 CLINICAL DATA:  Found down in his apartment. Laceration to left cheek. EXAM:  CT HEAD WITHOUT CONTRAST CT MAXILLOFACIAL WITHOUT CONTRAST CT CERVICAL SPINE WITHOUT CONTRAST TECHNIQUE: Multidetector CT imaging of the head, cervical spine, and maxillofacial structures were performed using the standard protocol without intravenous contrast. Multiplanar CT image reconstructions of the cervical spine and maxillofacial structures were also generated. COMPARISON:  Cervical spine x-rays dated August 03, 2008. 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 No evidence of hemorrhage, hydrocephalus, extra-axial collection or mass lesion/mass effect. Mild to moderate generalized cerebral atrophy. Mild scattered periventricular and subcortical white matter hypodensities are nonspecific, but favored to reflect chronic microvascular ischemic changes. Vascular: Atherosclerotic vascular calcification of the carotid siphons. No hyperdense vessel. Skull: Normal. Negative for fracture or focal lesion. Other: Small left frontal scalp hematoma. CT MAXILLOFACIAL FINDINGS Osseous: No fracture or mandibular dislocation. No destructive process. Periapical lucencies involving the right maxillary first and second molars. Orbits: Negative. No traumatic or inflammatory finding. Sinuses: Chronic appearing mild-to-moderate moderate mucosal thickening of the  paranasal sinuses, moderate in the left maxillary sinus. The mastoid air cells are clear. Soft tissues: Left cheek laceration. CT CERVICAL SPINE FINDINGS Alignment: Reversal of the normal cervical lordosis, centered at C5, unchanged. No traumatic malalignment. Skull base and vertebrae: No acute cervical spine fracture. Mild anterior superior endplate height loss at T1 with associated Schmorl's node. No primary bone lesion or focal pathologic process. Soft tissues and spinal canal: Mild prevertebral soft tissue thickening. Subcutaneous stranding and fluid in the left neck. No visible canal hematoma. Disc levels: Moderate to severe disc height loss at C2-C3, C5-C6, and C6-C7, progressed when compared to prior study. Mild bilateral neuroforaminal stenosis at C5-C6 and C6-C7. Upper chest: Negative. Other: 2.9 cm superficial subcutaneous cystic lesion in the right posterior neck, likely a sebaceous cyst. IMPRESSION: 1. 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 2. No acute cervical spine fracture. Mild prevertebral soft tissue thickening and subcutaneous stranding/fluid in the left neck. Ligamentous and/or soft tissue injury is not excluded. Recommend MRI cervical spine for further evaluation. 3. Mild anterior superior endplate height loss at T1 with associated Schmorl's node is favored chronic. 4.  No acute facial fracture. 5. Small left frontal scalp hematoma.  Left cheek laceration. Electronically Signed   By: Obie Dredge M.D.   On: 12/17/2017 12:06   Mr Maxine Glenn Head Wo Contrast  Result Date: 12/18/2017 CLINICAL DATA:  Altered mental status EXAM: MRI HEAD WITHOUT CONTRAST MRA HEAD WITHOUT CONTRAST TECHNIQUE: Multiplanar, multiecho pulse sequences of the brain and surrounding structures were obtained without intravenous contrast. Angiographic images of the head were obtained using MRA technique without contrast. COMPARISON:  None. FINDINGS: MRI HEAD FINDINGS Brain: Multifocal  abnormal diffusion restriction. The largest areas involve the left anterior and middle cerebral artery territories. There are smaller infarct scattered within the left deep gray structures, right occipital lobe, right temporal lobe, right midbrain and both cerebellar hemispheres. Extensive hyperintense T2-weighted signal corresponds to the areas of ischemia. There is no midline shift. No acute hemorrhage. No mass lesion. No chronic microhemorrhage or cerebral amyloid angiopathy. No hydrocephalus, age advanced atrophy or lobar predominant volume loss. No dural abnormality or extra-axial collection. Skull and upper cervical spine: The visualized skull base, calvarium, upper cervical spine and extracranial soft tissues are normal. Sinuses/Orbits: Moderate left maxillary sinus mucosal disease. Small amount of bilateral mastoid fluid. Normal orbits. MRA HEAD FINDINGS Intracranial internal carotid arteries: Normal. Anterior cerebral arteries: The left anterior cerebral arteries occluded of the proximal A2 segment. Normal right  ACA. Middle cerebral arteries: There is occlusion of the left middle cerebral artery inferior division. Superior division and M1 segment are normal. Right MCA is normal. Posterior communicating arteries: Present bilaterally. Posterior cerebral arteries: Bilateral fetal origins.  Normal. Basilar artery: Normal. Vertebral arteries: Left dominant. Distal right vertebral artery appears occluded or severely stenotic. Superior cerebellar arteries: Normal. Anterior inferior cerebellar arteries: Normal. Posterior inferior cerebellar arteries: Nonvisualization of right PICA. Normal left. IMPRESSION: 1. 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 No acute hemorrhage or herniation. 2. Multiple small foci of acute ischemia within the left basal ganglia, left thalamus, right occipital lobe, right temporal lobe, right midbrain and  bilaterally within the cerebellum. 3. 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 4. Occlusion of the proximal to midportion of the left anterior cerebral artery A2 segment. Electronically Signed   By: Deatra Robinson M.D.   On: 12/18/2017 17:49   Mr Brain Wo Contrast  Result Date: 12/18/2017 CLINICAL DATA:  Altered mental status EXAM: MRI HEAD WITHOUT CONTRAST MRA HEAD WITHOUT CONTRAST TECHNIQUE: Multiplanar, multiecho pulse sequences of the brain and surrounding structures were obtained without intravenous contrast. Angiographic images of the head were obtained using MRA technique without contrast. COMPARISON:  None. FINDINGS: MRI HEAD FINDINGS Brain: Multifocal abnormal diffusion restriction. The largest areas involve the left anterior and middle cerebral artery territories. There are smaller infarct scattered within the left deep gray structures, right occipital lobe, right temporal lobe, right midbrain and both cerebellar hemispheres. Extensive hyperintense T2-weighted signal corresponds to the areas of ischemia. There is no midline shift. No acute hemorrhage. No mass lesion. No chronic microhemorrhage or cerebral amyloid angiopathy. No hydrocephalus, age advanced atrophy or lobar predominant volume loss. No dural abnormality or extra-axial collection. Skull and upper cervical spine: The visualized skull base, calvarium, upper cervical spine and extracranial soft tissues are normal. Sinuses/Orbits: Moderate left maxillary sinus mucosal disease. Small amount of bilateral mastoid fluid. Normal orbits. MRA HEAD FINDINGS Intracranial internal carotid arteries: Normal. Anterior cerebral arteries: The left anterior cerebral arteries occluded of the proximal A2 segment. Normal right ACA. Middle cerebral arteries: There is occlusion of the left middle cerebral artery inferior division. Superior division and M1 segment are normal. Right MCA is normal. Posterior communicating arteries:  Present bilaterally. Posterior cerebral arteries: Bilateral fetal origins.  Normal. Basilar artery: Normal. Vertebral arteries: Left dominant. Distal right vertebral artery appears occluded or severely stenotic. Superior cerebellar arteries: Normal. Anterior inferior cerebellar arteries: Normal. Posterior inferior cerebellar arteries: Nonvisualization of right PICA. Normal left. IMPRESSION: 1. 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 No acute hemorrhage or herniation. 2. Multiple small foci of acute ischemia within the left basal ganglia, left thalamus, right occipital lobe, right temporal lobe, right midbrain and bilaterally within the cerebellum. 3. 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 4. Occlusion of the proximal to midportion of the left anterior cerebral artery A2 segment. Electronically Signed   By: Deatra Robinson M.D.   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 Electronically Signed   By: Sebastian Ache M.D.   On: 12/18/2017 12:32   Result Date: 12/18/2017 CLINICAL DATA:  Found down on  floor unresponsive. Prevertebral swelling on CT. EXAM: MRI CERVICAL SPINE WITHOUT CONTRAST TECHNIQUE: Multiplanar, multisequence MR imaging of the cervical spine was performed. No intravenous contrast was administered. COMPARISON:  Cervical spine CT 12/17/2017 FINDINGS: Alignment: Mild reversal the normal cervical lordosis. Trace retrolisthesis of C2 on C3, C5 on C6, and C6 on C7. Vertebrae: T1 superior endplate Schmorl's node without marrow edema. Advanced disc degeneration at C5-6 and C6-7 with T2/stir hyperintensity diffusely throughout both disc spaces and mild endplate edema. Cord: Normal  signal. Posterior Fossa, vertebral arteries, paraspinal tissues: Prevertebral fluid extends from C2-C7 and measures up to 1 cm in AP thickness at the C3-4 level. Edema in the more superficial soft tissues of the left lateral neck was more fully imaged on the earlier CT and may favor a posttraumatic etiology for the prevertebral fluid. No discrete anterior longitudinal ligament disruption is identified. There is no soft tissue edema suggestive of posterior ligamentous complex injury. 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 Preserved vertebral artery flow voids in the neck. Abnormal appearance of the right V4 segment. 2.9 cm T2 hyperintense subcutaneous lesion in the posterior right lower neck, likely a sebaceous cyst. Disc levels: C2-3: Severe disc space narrowing. Disc bulging, uncovertebral spurring, infolding of the ligamentum flavum, and moderate right facet arthrosis result in moderate spinal stenosis and mild-to-moderate right and severe left neural foraminal stenosis. C3-4: Small to moderate-sized central disc extrusion with mild superior migration and infolding of the ligamentum flavum result in moderate spinal stenosis. No significant neural foraminal stenosis. C4-5: Broad posterior disc protrusion results in mild spinal stenosis. Mild bilateral neural foraminal stenosis. C5-6: Severe disc space narrowing. Broad-based posterior disc osteophyte complex results in mild-to-moderate spinal stenosis and severe bilateral neural foraminal stenosis. C6-7: Severe disc space narrowing. Broad-based posterior disc osteophyte complex results in mild-to-moderate spinal stenosis and severe bilateral neural foraminal stenosis. C7-T1: Minimal disc bulging and mild facet arthrosis without stenosis. IMPRESSION: 1. Diffuse cervical prevertebral fluid/edema. This could reflect posttraumatic effusion and soft tissue injury versus early infectious discitis at C5-6 and C6-7. 2. 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 Electronically Signed: By: Sebastian Ache M.D. On: 12/18/2017 11:44   Dg Chest Port 1 View  Result Date: 12/20/2017 CLINICAL DATA:  Aspiration pneumonia, found unresponsive 3 days ago EXAM: PORTABLE CHEST 1 VIEW COMPARISON:  12/18/2017 FINDINGS: Persistent low lung volumes with minor basilar atelectasis. No definite pneumonia, collapse or consolidation. Negative for edema, effusion or pneumothorax. Exam is rotated to the left. Aorta is atherosclerotic. No acute osseous finding. IMPRESSION: Persistent low lung volumes with basilar atelectasis. Electronically Signed   By: Judie Petit.  Shick M.D.   On: 12/20/2017 10:20   Portable Chest X-ray 1 View  Result Date: 12/18/2017 CLINICAL DATA:  Sepsis, AFib EXAM: PORTABLE CHEST 1 VIEW COMPARISON:  12/17/2017 FINDINGS: Mild lingular and bilateral lower lobe opacities, likely atelectasis, although pneumonia remains possible. No frank interstitial edema.  No pleural effusion or pneumothorax. The heart is normal in size. IMPRESSION: Mild lingular and bilateral lower lobe opacities, likely atelectasis, although pneumonia remains possible. Electronically Signed   By: Charline Bills M.D.   On: 12/18/2017 07:35   Dg Chest Port 1 View  Result Date: 12/17/2017 CLINICAL DATA:  Chest pain, altered mental status EXAM: PORTABLE CHEST 1 VIEW COMPARISON:  Chest x-ray of 05/18/2017 FINDINGS: No active infiltrate or effusion is seen. Mediastinal and hilar contours are unremarkable. The heart is  borderline enlarged and stable. No bony abnormality is seen. IMPRESSION: No active lung disease.  Heart upper normal in size. Electronically Signed   By: Dwyane Dee M.D.   On: 12/17/2017 11:56   Dg Chest Port 1v Same Day  Result Date: 12/17/2017 CLINICAL DATA:  72 year old male with code sepsis, and evolving lung sounds EXAM: PORTABLE CHEST 1 VIEW COMPARISON:  Chest x-ray  obtained earlier today at 11:40 a.m. FINDINGS: Significantly lower inspiratory volumes with developing lingular and left lower lobe streaky airspace opacities favored to reflect atelectasis. No overt pulmonary edema, pneumothorax or pleural effusion. Stable cardiac and mediastinal contours. No acute osseous abnormality. IMPRESSION: 1. Lower inspiratory volumes with developing streaky lingular and left lower lobe airspace opacities favored to reflect atelectasis. Electronically Signed   By: Malachy Moan M.D.   On: 12/17/2017 18:08   Ct Maxillofacial Wo Cm  Result Date: 12/17/2017 CLINICAL DATA:  Found down in his apartment. Laceration to left cheek. EXAM: CT HEAD WITHOUT CONTRAST CT MAXILLOFACIAL WITHOUT CONTRAST CT CERVICAL SPINE WITHOUT CONTRAST TECHNIQUE: Multidetector CT imaging of the head, cervical spine, and maxillofacial structures were performed using the standard protocol without intravenous contrast. Multiplanar CT image reconstructions of the cervical spine and maxillofacial structures were also generated. COMPARISON:  Cervical spine x-rays dated August 03, 2008. 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 No evidence of hemorrhage, hydrocephalus, extra-axial collection or mass lesion/mass effect. Mild to moderate generalized cerebral atrophy. Mild scattered periventricular and subcortical white matter hypodensities are nonspecific, but favored to reflect chronic microvascular ischemic changes. Vascular: Atherosclerotic vascular calcification of the carotid siphons. No hyperdense vessel. Skull: Normal. Negative for fracture or focal lesion. Other: Small left frontal scalp hematoma. CT MAXILLOFACIAL FINDINGS Osseous: No fracture or mandibular dislocation. No destructive process. Periapical lucencies involving the right maxillary first and second molars. Orbits: Negative. No traumatic or inflammatory finding. Sinuses: Chronic  appearing mild-to-moderate moderate mucosal thickening of the paranasal sinuses, moderate in the left maxillary sinus. The mastoid air cells are clear. Soft tissues: Left cheek laceration. CT CERVICAL SPINE FINDINGS Alignment: Reversal of the normal cervical lordosis, centered at C5, unchanged. No traumatic malalignment. Skull base and vertebrae: No acute cervical spine fracture. Mild anterior superior endplate height loss at T1 with associated Schmorl's node. No primary bone lesion or focal pathologic process. Soft tissues and spinal canal: Mild prevertebral soft tissue thickening. Subcutaneous stranding and fluid in the left neck. No visible canal hematoma. Disc levels: Moderate to severe disc height loss at C2-C3, C5-C6, and C6-C7, progressed when compared to prior study. Mild bilateral neuroforaminal stenosis at C5-C6 and C6-C7. Upper chest: Negative. Other: 2.9 cm superficial subcutaneous cystic lesion in the right posterior neck, likely a sebaceous cyst. IMPRESSION: 1. 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 2. No acute cervical spine fracture. Mild prevertebral soft tissue thickening and subcutaneous stranding/fluid in the left neck. Ligamentous and/or soft tissue injury is not excluded. Recommend MRI cervical spine for further evaluation. 3. Mild anterior superior endplate height loss at T1 with associated Schmorl's node is favored chronic. 4.  No acute facial fracture. 5. Small left frontal scalp hematoma.  Left cheek laceration. Electronically Signed   By: Obie Dredge M.D.   On: 12/17/2017 12:06    Catarina Hartshorn, DO  [facility] Pager [phone]  If 7PM-7AM, please contact night-coverage [email or web address] Password [email or web address] 12/20/2017, 11:05 AM   LOS: 3 days

## 2017-12-20 NOTE — Progress Notes (Signed)
Paged by RN about tachycardia in 140s after NG placed  SBP in 110s  STAT EKG ordered-->Afib RVR, nonspecific Twave changes  --ordered lopressor 5 mg IV x 1 --increased frequency of scheduled  Lopressor to 2.5 mg IV q 6 hours --avoiding diltiazem due to EF 40% --changed pt to DNR advanced directive discussion with family this am  Am BMP and mag  DTat

## 2017-12-20 NOTE — Progress Notes (Signed)
At about 1940, patient noted heart rate to be in the 140's with multiform PVC's. Blood pressure 113/75. Dr Tat paged and notified. EKG done and Dr. Arbutus Leas notified of EKG result of Atrial fib with RVR. IV Lopressor ordered and given.

## 2017-12-20 NOTE — Plan of Care (Signed)
  Problem: Acute Rehab OT Goals (only OT should resolve) Goal: Pt. Will Perform Grooming Flowsheets (Taken 12/20/2017 0918) Pt Will Perform Grooming: with mod assist;sitting Goal: Pt. Will Perform Upper Body Dressing Flowsheets (Taken 12/20/2017 0918) Pt Will Perform Upper Body Dressing: with mod assist;with max assist;sitting;with caregiver independent in assisting Goal: Pt. Will Transfer To Toilet Flowsheets (Taken 12/20/2017 [phone]) Pt Will Transfer to Toilet: with mod assist;with max assist;squat pivot transfer;stand pivot transfer;bedside commode Goal: Pt. Will Perform Toileting-Clothing Manipulation Flowsheets (Taken 12/20/2017 0918) Pt Will Perform Toileting - Clothing Manipulation and hygiene: with max assist;sitting/lateral leans;sit to/from stand Goal: Pt/Caregiver Will Perform Home Exercise Program Flowsheets (Taken 12/20/2017 [phone]) Pt/caregiver will Perform Home Exercise Program: Increased strength;Both right and left upper extremity;With written HEP provided Note:  Mod to max assist from caregiver

## 2017-12-20 NOTE — Progress Notes (Signed)
Speech Language Pathology Treatment: Dysphagia  Patient Details Name: Juan Barber MRN: [id number] DOB: Sep 01, 1945 Today's Date: 12/20/2017 Time: 4098-1191 SLP Time Calculation (min) (ACUTE ONLY): 17 min  Assessment / Plan / Recommendation Clinical Impression  Pt seen for ongoing diagnostic dysphagia intervention. Pt alert with eyes open, but did not attempt to follow simple commands despite multimodality cues. Pt with left eye gaze preference with head turned to the left. He moved his left hand when SLP repositioned Pt in bed. Oral care completed, however Pt tends to bite down on Yankaur suction tube. Pt with audible secretions. He recently had NG placed for suction. SLP provided single ice chip, which Pt accepted, but held in oral cavity. Pt with labial spillage, but swallow attempts (hyolaryngeal excursion) palpated. Pt continues to present with suspected severe oropharyngeal dysphagia in setting of acute CVA and deconditioning. He is not yet appropriate for objective assessment. SLP will follow for po readiness. Continue with oral care. Pt minimally responsive for SLE at this time, so will defer for now.    HPI HPI: Juan Barber a 72 y.o.malewith medical history significant ofatrial fibrillation, nonischemic cardiomyopathy, seizures, paranoid behaviors after serving in the [employer] who was found down on his home after a welfare check was done on his apartment. Neighbors had not seen him since Thursday and the police was present at his apartment for another reason and the apartment staff decided to check on the patient. They found him down on the floor unresponsive covered in urine. Brought into the emergency department where he was noted to be in atrial fibrillation with rapid ventricular response. He was given multiple medications including diltiazem and Lopressor to improve his heart rate. Ultimately was started on a diltiazem drip. After that his blood pressure dropped when he  spiked a temperature to 1.9 in the emergency department. Blood pressure had been in the 180s systolic and then dropped to the 78/47 range. Received 4 L of IV fluid and his blood pressure has improved to the mid to low 100s currently 107/62. The patient was found to have markedly infected urine, and elevated troponin, elevated blood glucoses, and elevated bilirubin, and evaded CPK assistant with acute rhabdomyolysis. CT scan of the head showed lacunar infarcts of undetermined age, no acute C-spine fracture some soft tissue swelling of the left knee no facial fractures and a left frontal scalp hematoma. Sepsis protocol was initiated and the patient received appropriate IV fluids, IV antibiotics, and take acid was found to be 6 with a repeat of 3. Once he received that significant amount of IV fluid his chest became more congested. Repeat chest x-ray revealed left lower lobe atelectasis but given concerns regarding his pulmonary health and possible aspiration associated with mental status changes while on the ground antibiotics were started to cover the patient for both a severe urinary tract infection and a pneumonia. MR Cervical spine shows:Diffuse cervical prevertebral fluid/edema. This could reflect posttraumatic effusion and soft tissue injury versus early infectious discitis at C5-6 and C6-7. 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 MRI completed this AM, but results are still pending. BSE requested.       SLP Plan  Continue with current plan of care       Recommendations  Diet recommendations: NPO Medication Administration: Via alternative means                Oral Care Recommendations: Oral care QID;Staff/trained caregiver to provide oral  care Follow up Recommendations: Skilled Nursing facility SLP Visit Diagnosis: Dysphagia, oropharyngeal phase (R13.12) Plan: Continue with current plan of  care       Thank you,  Havery Moros, CCC-SLP [phone]                 PORTER,DABNEY 12/20/2017, 8:22 PM

## 2017-12-20 NOTE — Progress Notes (Signed)
Inpatient Diabetes Program Recommendations  AACE/ADA: New Consensus Statement on Inpatient Glycemic Control (2015)  Target Ranges:  Prepandial:   less than 140 mg/dL      Peak postprandial:   less than 180 mg/dL (1-2 hours)      Critically ill patients:  140 - 180 mg/dL  Results for TAURINO, FIGEROA (MRN [id number]) as of 12/20/2017 09:46  Ref. Range 12/19/2017 07:58 12/19/2017 12:02 12/19/2017 17:07 12/19/2017 19:45 12/20/2017 00:26 12/20/2017 03:50 12/20/2017 07:51 12/20/2017 09:18  Glucose-Capillary Latest Ref Range: 65 - 99 mg/dL 226 (H) 333 (H) 545 (H) 241 (H) 244 (H) 282 (H) 271 (H) 280 (H)  Results for JAMEEK, DITOMMASO (MRN [id number]) as of 12/20/2017 09:46  Ref. Range 05/18/2017 10:33 12/17/2017 17:59  Hemoglobin A1C Latest Ref Range: 4.8 - 5.6 % 5.8 (H) 5.8 (H)    Review of Glycemic Control  Diabetes history: NO Outpatient Diabetes medications: NA Current orders for Inpatient glycemic control: Novolog 0-15 units Q4H  Inpatient Diabetes Program Recommendations: Insulin - Basal: Patient last received Solumedrol 40 mg on 12/19/17 and glucose continues to be in mid to upper 200's mg/dl despite Novolog correction scale. If appropriate, please consider ordering Lantus 8 units x1 now and reassess daily if basal insulin is needed   Thanks, Orlando Penner, RN, MSN, CDE Diabetes Coordinator Inpatient Diabetes Program [phone] (Team Pager from 8am to 5pm)

## 2017-12-20 NOTE — Clinical Social Work Note (Signed)
Received two CSW consults for pt found down at home after several days on the floor. Per MD, pt is not doing well and Palliative Care APNP/MD/Chaplain having discussions with family about poor prognosis. Pt's son reportedly having a difficult time accepting this. Discussions to continue today.   Per MD, pt is not going to recover from this. Will clear current consults for placement. Will be available to assist if family agrees to hospice referral.

## 2017-12-20 NOTE — Progress Notes (Signed)
Physical Therapy Treatment Patient Details Name: Juan Barber MRN: [id number] DOB: 09-28-1945 Today's Date: 12/20/2017    History of Present Illness Juan Barber is a 72 y.o. 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  Neighbors had not seen him since Thursday and the police was present at his apartment for another reason and the apartment staff decided to check on the patient.  They found him down on the floor unresponsive covered in urine.  Brought into the emergency department where he was noted to be in atrial fibrillation with rapid ventricular response.  He was given multiple medications including diltiazem and Lopressor to improve his heart rate.  Ultimately was started on a diltiazem drip.  After that his blood pressure dropped when he spiked a temperature to 1.9 in the emergency department.  Blood pressure had been in the 180s systolic and then dropped to the 78/47 range.  Received 4 L of IV fluid and his blood pressure has improved to the mid to low 100s currently 107/62.  The patient was found to have markedly infected urine, and elevated troponin, elevated blood glucoses, and elevated bilirubin, and evaded CPK assistant with acute rhabdomyolysis.  CT scan of the head showed lacunar infarcts of undetermined age, no acute C-spine fracture some soft tissue swelling of the left knee no facial fractures and a left frontal scalp hematoma.  Sepsis protocol was initiated and the patient received appropriate IV fluids, IV antibiotics, and take acid was found to be 6 with a repeat of 3.  Once he received that significant amount of IV fluid his chest became more congested.  Repeat chest x-ray revealed left lower lobe atelectasis but given concerns regarding his pulmonary health and possible aspiration associated with  mental status changes while on the ground antibiotics were started to cover the patient for both a severe urinary tract infection and a pneumonia.    PT Comments    Patient presents with head laterally flexed to the left with eyes open and flat affect - family members at bedside.  Patient seen for ROM to BLE all planes of motion x 15 reps each, demonstrates trace movement and provided some resistance with LLE, no movement noted in RLE, tolerated sitting up at bedside for approximately 20 minutes, unable to hold head up due to neck weakness and put back to bed with total assistance to reposition.  Placed pillow to left side of patient's head to keep patient's head in midline (straight).  Patient will benefit from continued physical therapy in hospital and recommended venue below to increase strength, balance, endurance for safe ADLs and gait.   Follow Up Recommendations  SNF;Supervision/Assistance - 24 hour     Equipment Recommendations  None recommended by PT    Recommendations for Other Services       Precautions / Restrictions Precautions Precautions: Fall Restrictions Weight Bearing Restrictions: No    Mobility  Bed Mobility Overal bed mobility: Needs Assistance Bed Mobility: Supine to Sit;Sit to Supine     Supine to sit: Total assist Sit to supine: Total assist      Transfers                    Ambulation/Gait                 Stairs  Wheelchair Mobility    Modified Rankin (Stroke Patients Only)       Balance Overall balance assessment: Needs assistance Sitting-balance support: Feet supported;Bilateral upper extremity supported Sitting balance-Leahy Scale: Poor Sitting balance - Comments: unable keep trunk in midline, falls forward                                    Cognition Arousal/Alertness: Lethargic Behavior During Therapy: Flat affect Overall Cognitive Status: Impaired/Different from baseline                                         Exercises Other Exercises Other Exercises: AAROM to LLE all planes of motion x 15 reps each Other Exercises: PROM to RLE all planes of motion x 15 reps each Other Exercises: seated weight shifting left/right, forward/backwards x 5 reps each    General Comments        Pertinent Vitals/Pain Pain Assessment: Faces Faces Pain Scale: Hurts a little bit Pain Location: neck when placing head in midline Pain Descriptors / Indicators: Discomfort;Grimacing Pain Intervention(s): Limited activity within patient's tolerance;Monitored during session    Home Living                      Prior Function            PT Goals (current goals can now be found in the care plan section) Acute Rehab PT Goals Patient Stated Goal: none stated  PT Goal Formulation: With patient/family Time For Goal Achievement: 01/02/18 Potential to Achieve Goals: Fair Progress towards PT goals: Progressing toward goals    Frequency    Min 4X/week      PT Plan Current plan remains appropriate    Co-evaluation              AM-PAC PT "6 Clicks" Daily Activity  Outcome Measure  Difficulty turning over in bed (including adjusting bedclothes, sheets and blankets)?: A Lot Difficulty moving from lying on back to sitting on the side of the bed? : A Lot Difficulty sitting down on and standing up from a chair with arms (e.g., wheelchair, bedside commode, etc,.)?: Unable Help needed moving to and from a bed to chair (including a wheelchair)?: Total Help needed walking in hospital room?: Total Help needed climbing 3-5 steps with a railing? : Total 6 Click Score: 8    End of Session   Activity Tolerance: Patient limited by lethargy;Patient tolerated treatment well Patient left: in bed;with call bell/phone within reach;with family/visitor present Nurse Communication: Mobility status PT Visit Diagnosis: Unsteadiness on feet (R26.81);Other abnormalities of  gait and mobility (R26.89);Muscle weakness (generalized) (M62.81)     Time: 8372-9021 PT Time Calculation (min) (ACUTE ONLY): 33 min  Charges:  $Therapeutic Exercise: 8-22 mins $Therapeutic Activity: 8-22 mins                    G Codes:       3:08 PM, 12/29/17 [name], MPT Physical Therapist with [facility] 336 [phone] office [phone] mobile phone

## 2017-12-20 NOTE — Evaluation (Signed)
Occupational Therapy Evaluation Patient Details Name: Juan Barber MRN: [id number] DOB: [birth year] Today's Date: 12/20/2017    History of Present Illness Juan Barber is a 72 y.o. 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  Neighbors had not seen him since Thursday and the police was present at his apartment for another reason and the apartment staff decided to check on the patient.  They found him down on the floor unresponsive covered in urine.  Brought into the emergency department where he was noted to be in atrial fibrillation with rapid ventricular response.  He was given multiple medications including diltiazem and Lopressor to improve his heart rate.  Ultimately was started on a diltiazem drip.  After that his blood pressure dropped when he spiked a temperature to 1.9 in the emergency department.  Blood pressure had been in the 180s systolic and then dropped to the 78/47 range.  Received 4 L of IV fluid and his blood pressure has improved to the mid to low 100s currently 107/62.  The patient was found to have markedly infected urine, and elevated troponin, elevated blood glucoses, and elevated bilirubin, and evaded CPK assistant with acute rhabdomyolysis.  CT scan of the head showed lacunar infarcts of undetermined age, no acute C-spine fracture some soft tissue swelling of the left knee no facial fractures and a left frontal scalp hematoma.  Sepsis protocol was initiated and the patient received appropriate IV fluids, IV antibiotics, and take acid was found to be 6 with a repeat of 3.  Once he received that significant amount of IV fluid his chest became more congested.  Repeat chest x-ray revealed left lower lobe atelectasis but given concerns regarding his pulmonary health and possible aspiration associated  with mental status changes while on the ground antibiotics were started to cover the patient for both a severe urinary tract infection and a pneumonia.   Clinical Impression   Pt received supine in bed, awakened via verbal and tactile stimulation, daughter Juan Barber present for evaluation. Pt unable to communicate or follow commands, does not track OT visually however does look at daughter Juan Barber occasionally during evaluation. Pt lying with head bent to left side, resists repositioning. Daughter and nursing report pt has been pulling at leads with left hand, during evaluation pt did move left hand/forearm to rest on his stomach, otherwise no volitional movement during evaluation. Pt is currently total care for all ADLs and mobility. Recommend SNF on discharge if family does not opt to pursue hospice options. Will continue to follow while in acute care.     Follow Up Recommendations  SNF;Supervision/Assistance - 24 hour    Equipment Recommendations  None recommended by OT       Precautions / Restrictions Precautions Precautions: Fall Restrictions Weight Bearing Restrictions: No      Mobility Bed Mobility               General bed mobility comments: not completed during evaluation  Transfers                 General transfer comment: not completed during evaluation        ADL either performed or assessed with clinical judgement   ADL Overall ADL's : Needs assistance/impaired  General ADL Comments: Pt is currently total assist with all ADL tasks.      Vision Baseline Vision/History: (unknown) Patient Visual Report: (Pt unable to provide information) Additional Comments: Unable to assess     Perception     Praxis      Pertinent Vitals/Pain Pain Assessment: Faces Faces Pain Scale: Hurts a little bit Pain Location: bilateral wrists during passive flexion Pain Descriptors / Indicators: Grimacing Pain  Intervention(s): Limited activity within patient's tolerance;Monitored during session;Repositioned     Hand Dominance (unknown)   Extremity/Trunk Assessment Upper Extremity Assessment Upper Extremity Assessment: RUE deficits/detail;LUE deficits/detail RUE Deficits / Details: RUE strength 0/5 throughout, one instance of twitch in digits. P/ROM is WFL, grimacing with wrist flexion RUE Coordination: decreased fine motor;decreased gross motor LUE Deficits / Details: shoulder strength 1/5, elbow 2+/5. Dupuytren's-like contracture at 4th and 5th digits. wrist P/ROM limited at 50%, shoulder P/ROM WFL LUE Coordination: decreased fine motor;decreased gross motor    Lower Extremity Assessment Lower Extremity Assessment: Defer to PT evaluation   Cervical / Trunk Assessment Cervical / Trunk Assessment: Kyphotic   Communication Communication Communication: Receptive difficulties;Expressive difficulties(nonverbal )   Cognition Arousal/Alertness: Lethargic Behavior During Therapy: Flat affect Overall Cognitive Status: Impaired/Different from baseline Area of Impairment: Orientation;Attention;Following commands                               General Comments: Pt unable to communiciate or follow instructions, awakens to verbal and tactile stimuli   General Comments               Home Living Family/patient expects to be discharged to:: Unsure Living Arrangements: Alone Available Help at Discharge: Family;Neighbor(family lives out of state) Type of Home: House                                  Prior Functioning/Environment Level of Independence: Independent        Comments: Daughter, Juan Barber, reports pt was independent PTA. Pt broke back and wrists last year and was working with Cardiology to be eligible for surgery.         OT Problem List: Decreased strength;Decreased activity tolerance;Impaired balance (sitting and/or standing);Decreased  coordination;Decreased cognition;Decreased safety awareness;Decreased knowledge of use of DME or AE;Impaired UE functional use;Pain      OT Treatment/Interventions: Self-care/ADL training;Therapeutic exercise;Neuromuscular education;DME and/or AE instruction;Therapeutic activities;Patient/family education    OT Goals(Current goals can be found in the care plan section) Acute Rehab OT Goals Patient Stated Goal: none stated  OT Goal Formulation: Patient unable to participate in goal setting Time For Goal Achievement: 12/20/17 Potential to Achieve Goals: Fair  OT Frequency: Min 2X/week    End of Session    Activity Tolerance: Patient limited by lethargy Patient left: in bed;with call bell/phone within reach;with bed alarm set;with family/visitor present  OT Visit Diagnosis: Muscle weakness (generalized) (M62.81)                Time: 2334-3568 OT Time Calculation (min): 18 min Charges:  OT General Charges $OT Visit: 1 Visit OT Evaluation $OT Eval Moderate Complexity: 1 [address], OTR/L  [phone] 12/20/2017, 9:13 AM

## 2017-12-21 LAB — OSMOLALITY: Osmolality: 366 mOsm/kg (ref 275–295)

## 2017-12-21 LAB — BASIC METABOLIC PANEL
BUN: 67 mg/dL — ABNORMAL HIGH (ref 6–20)
CALCIUM: 8.5 mg/dL — AB (ref 8.9–10.3)
CO2: 24 mmol/L (ref 22–32)
CREATININE: 1.55 mg/dL — AB (ref 0.61–1.24)
Chloride: >130 mmol/L (ref 101–111)
GFR calc Af Amer: 50 mL/min — ABNORMAL LOW (ref >60)
GFR, EST NON AFRICAN AMERICAN: 43 mL/min — AB (ref >60)
Glucose, Bld: 196 mg/dL — ABNORMAL HIGH (ref 65–99)
Potassium: 3.8 mmol/L (ref 3.5–5.1)
SODIUM: 163 mmol/L — AB (ref 135–145)

## 2017-12-21 LAB — GLUCOSE, CAPILLARY
GLUCOSE-CAPILLARY: 141 mg/dL — AB (ref 65–99)
GLUCOSE-CAPILLARY: 190 mg/dL — AB (ref 65–99)
GLUCOSE-CAPILLARY: 251 mg/dL — AB (ref 65–99)
Glucose-Capillary: 162 mg/dL — ABNORMAL HIGH (ref 65–99)
Glucose-Capillary: 181 mg/dL — ABNORMAL HIGH (ref 65–99)
Glucose-Capillary: 220 mg/dL — ABNORMAL HIGH (ref 65–99)

## 2017-12-21 LAB — CBC WITH DIFFERENTIAL/PLATELET
BASOS ABS: 0 10*3/uL (ref 0.0–0.1)
BASOS PCT: 0 %
EOS ABS: 0 10*3/uL (ref 0.0–0.7)
Eosinophils Relative: 0 %
HCT: 32 % — ABNORMAL LOW (ref 39.0–52.0)
Hemoglobin: 10.6 g/dL — ABNORMAL LOW (ref 13.0–17.0)
LYMPHS PCT: 5 %
Lymphs Abs: 1.3 10*3/uL (ref 0.7–4.0)
MCH: 33 pg (ref 26.0–34.0)
MCHC: 33.1 g/dL (ref 30.0–36.0)
MCV: 99.7 fL (ref 78.0–100.0)
MONO ABS: 0.8 10*3/uL (ref 0.1–1.0)
Monocytes Relative: 3 %
NEUTROS ABS: 24 10*3/uL — AB (ref 1.7–7.7)
NEUTROS PCT: 92 %
PLATELETS: 26 10*3/uL — AB (ref 150–400)
RBC: 3.21 MIL/uL — ABNORMAL LOW (ref 4.22–5.81)
RDW: 15 % (ref 11.5–15.5)
WBC: 26.1 10*3/uL — ABNORMAL HIGH (ref 4.0–10.5)

## 2017-12-21 LAB — MAGNESIUM: Magnesium: 3 mg/dL — ABNORMAL HIGH (ref 1.7–2.4)

## 2017-12-21 LAB — OSMOLALITY, URINE: OSMOLALITY UR: 651 mosm/kg (ref 300–900)

## 2017-12-21 MED ORDER — SODIUM CHLORIDE 0.9 % IV SOLN
1.0000 g | INTRAVENOUS | Status: DC
  Administered 2017-12-22: 1 g via INTRAVENOUS
  Filled 2017-12-21 (×4): qty 10

## 2017-12-21 MED ORDER — DIGOXIN 0.25 MG/ML IJ SOLN
0.2500 mg | Freq: Four times a day (QID) | INTRAMUSCULAR | Status: AC
  Administered 2017-12-21 – 2017-12-22 (×4): 0.25 mg via INTRAVENOUS
  Filled 2017-12-21 (×4): qty 2

## 2017-12-21 MED ORDER — VITAL AF 1.2 CAL PO LIQD
1000.0000 mL | ORAL | Status: DC
  Administered 2017-12-21 – 2017-12-23 (×3): 1000 mL
  Filled 2017-12-21 (×6): qty 1000

## 2017-12-21 MED ORDER — CLINDAMYCIN PHOSPHATE 600 MG/50ML IV SOLN
600.0000 mg | Freq: Three times a day (TID) | INTRAVENOUS | Status: DC
  Administered 2017-12-21 – 2017-12-23 (×5): 600 mg via INTRAVENOUS
  Filled 2017-12-21 (×5): qty 50

## 2017-12-21 MED ORDER — METOPROLOL TARTRATE 5 MG/5ML IV SOLN
2.5000 mg | Freq: Four times a day (QID) | INTRAVENOUS | Status: DC
  Administered 2017-12-21 – 2017-12-23 (×8): 2.5 mg via INTRAVENOUS
  Filled 2017-12-21 (×8): qty 5

## 2017-12-21 MED ORDER — METOPROLOL TARTRATE 5 MG/5ML IV SOLN: 5.0000 mg | Freq: Four times a day (QID) | INTRAVENOUS | Status: DC

## 2017-12-21 MED ORDER — DEXTROSE 5 % IV SOLN
INTRAVENOUS | Status: DC
  Administered 2017-12-21 – 2017-12-22 (×6): via INTRAVENOUS

## 2017-12-21 NOTE — Progress Notes (Signed)
SLP Cancellation Note  Patient Details Name: JAYSE OBST MRN: [id number] DOB: 1945-08-17   Cancelled treatment:   SLP attempted to see Pt this day; however, sw tx c/x 2/2 multiple family members in room gathered around Pt. Pt sleeping with NG tube in place for nutrition. Per nursing Pt not following commands and is poorly responsive. Will continue to follow during acute stay.  Athena Masse  M.A., CF-SLP [email or web address] David Emeterio Balke 12/21/2017, 5:35 PM

## 2017-12-21 NOTE — Progress Notes (Signed)
CRITICAL VALUE ALERT  Critical Value:  Platelets 26  Date & Time Notied:  12/21/2017  7262  Provider Notified: Robb Matar MD  Orders Received/Actions taken: none at this time

## 2017-12-21 NOTE — Progress Notes (Signed)
Physical Therapy Treatment Patient Details Name: Juan Barber MRN: [id number] DOB: 23-Apr-1946 Today's Date: 12/21/2017    History of Present Illness Juan Barber is a 72 y.o. 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  Neighbors had not seen him since Thursday and the police was present at his apartment for another reason and the apartment staff decided to check on the patient.  They found him down on the floor unresponsive covered in urine.  Brought into the emergency department where he was noted to be in atrial fibrillation with rapid ventricular response.  He was given multiple medications including diltiazem and Lopressor to improve his heart rate.  Ultimately was started on a diltiazem drip.  After that his blood pressure dropped when he spiked a temperature to 1.9 in the emergency department.  Blood pressure had been in the 180s systolic and then dropped to the 78/47 range.  Received 4 L of IV fluid and his blood pressure has improved to the mid to low 100s currently 107/62.  The patient was found to have markedly infected urine, and elevated troponin, elevated blood glucoses, and elevated bilirubin, and evaded CPK assistant with acute rhabdomyolysis.  CT scan of the head showed lacunar infarcts of undetermined age, no acute C-spine fracture some soft tissue swelling of the left knee no facial fractures and a left frontal scalp hematoma.  Sepsis protocol was initiated and the patient received appropriate IV fluids, IV antibiotics, and take acid was found to be 6 with a repeat of 3.  Once he received that significant amount of IV fluid his chest became more congested.  Repeat chest x-ray revealed left lower lobe atelectasis but given concerns regarding his pulmonary health and possible aspiration associated with  mental status changes while on the ground antibiotics were started to cover the patient for both a severe urinary tract infection and a pneumonia.    PT Comments    Patient present with NGT and his daughter at bedside.  Patient's daughter instructed to have family attempt to communicate with patient from right side due to possible neglect and to also encouraged patient to turn his head to the right to avoid possible contracture of neck due to patient preferring to lie with head laterally flexed to left.  Patient tolerated gentle ROM to neck with emphasis on right lateral flexion, patient assisted to sitting up at bedside to his right side, unable to hold head up due to weakness, did attempt to support himself with LUE.  Patient put back to bed and able to keep his head in midline - RN encouraged to use pillows and/or reposition patient's head periodically to keep in midline.  Patient will benefit from continued physical therapy in hospital and recommended venue below to increase strength, balance, endurance for safe ADLs and gait.    Follow Up Recommendations  SNF;Supervision/Assistance - 24 hour     Equipment Recommendations  None recommended by PT    Recommendations for Other Services       Precautions / Restrictions Precautions Precautions: Fall Restrictions Weight Bearing Restrictions: No    Mobility  Bed Mobility Overal bed mobility: Needs Assistance Bed Mobility: Supine to Sit;Sit to Supine     Supine to sit: Total assist Sit to supine: Total assist   General bed mobility comments: patient attempted to assist with LUE  Transfers  Ambulation/Gait                 Stairs             Wheelchair Mobility    Modified Rankin (Stroke Patients Only)       Balance Overall balance assessment: Needs assistance Sitting-balance support: Feet supported;Bilateral upper extremity supported Sitting balance-Leahy Scale: Poor                                       Cognition Arousal/Alertness: Awake/alert Behavior During Therapy: Flat affect Overall Cognitive Status: Impaired/Different from baseline                                        Exercises      General Comments        Pertinent Vitals/Pain Pain Assessment: Faces Faces Pain Scale: Hurts little more Pain Location: neck when placing head in midline Pain Descriptors / Indicators: Discomfort;Grimacing Pain Intervention(s): Limited activity within patient's tolerance;Monitored during session    Home Living                      Prior Function            PT Goals (current goals can now be found in the care plan section) Acute Rehab PT Goals Patient Stated Goal: none stated  PT Goal Formulation: With patient/family Time For Goal Achievement: 01/02/18 Potential to Achieve Goals: Fair Progress towards PT goals: Progressing toward goals    Frequency    Min 4X/week      PT Plan Current plan remains appropriate    Co-evaluation              AM-PAC PT "6 Clicks" Daily Activity  Outcome Measure  Difficulty turning over in bed (including adjusting bedclothes, sheets and blankets)?: A Lot Difficulty moving from lying on back to sitting on the side of the bed? : A Lot Difficulty sitting down on and standing up from a chair with arms (e.g., wheelchair, bedside commode, etc,.)?: Unable Help needed moving to and from a bed to chair (including a wheelchair)?: Total Help needed walking in hospital room?: Total Help needed climbing 3-5 steps with a railing? : Total 6 Click Score: 8    End of Session   Activity Tolerance: Patient limited by fatigue;Patient tolerated treatment well Patient left: in bed;with call bell/phone within reach;with nursing/sitter in room Nurse Communication: Mobility status PT Visit Diagnosis: Unsteadiness on feet (R26.81);Other abnormalities of gait and mobility (R26.89);Muscle  weakness (generalized) (M62.81)     Time: 4098-1191 PT Time Calculation (min) (ACUTE ONLY): 17 min  Charges:  $Therapeutic Activity: 8-22 mins                    G Codes:       4:26 PM, 01/07/2018 Juan Barber, MPT Physical Therapist with [facility] 336 [phone] office [phone] mobile phone

## 2017-12-21 NOTE — Progress Notes (Signed)
Patient seen earlier this admission and signed off, reconsulted due to arrhythmia.     Progress Note  Patient Name: Juan Barber Date of Encounter: 12/21/2017  Primary Cardiologist: Charlton Haws, MD   Subjective   No events overnight, patient does not answer questions.   Inpatient Medications    Scheduled Meds: . Chlorhexidine Gluconate Cloth  6 each Topical Q0600  . insulin aspart  0-15 Units Subcutaneous Q4H  . levalbuterol  0.63 mg Nebulization TID  . mouth rinse  15 mL Mouth Rinse BID  . metoprolol tartrate  2.5 mg Intravenous Q6H  . mupirocin ointment  1 application Nasal BID  . mupirocin ointment   Topical Daily  . sodium chloride flush  3 mL Intravenous Q12H   Continuous Infusions: . dextrose 150 mL/hr at 12/21/17 0650  . famotidine (PEPCID) IV Stopped (12/20/17 2202)  . meropenem (MERREM) IV Stopped (12/21/17 0940)   PRN Meds: acetaminophen **OR** acetaminophen, bisacodyl, levalbuterol, ondansetron **OR** ondansetron (ZOFRAN) IV   Vital Signs    Vitals:   12/21/17 0600 12/21/17 0800 12/21/17 0853 12/21/17 0900  BP: 101/70 107/75  108/67  Pulse: (!) 114 (!) 123  (!) 122  Resp: 14 (!) 26  16  Temp:      TempSrc:      SpO2: 98% 96% 96% 99%  Weight:      Height:        Intake/Output Summary (Last 24 hours) at 12/21/2017 1015 Last data filed at 12/21/2017 0910 Gross per 24 hour  Intake 1630 ml  Output 1075 ml  Net 555 ml   Filed Weights   12/19/17 0500 12/20/17 0500 12/21/17 0500  Weight: 166 lb 7.2 oz (75.5 kg) 164 lb 14.5 oz (74.8 kg) 166 lb 10.7 oz (75.6 kg)    Telemetry    afib rates 130s- Personally Reviewed  ECG    na  Physical Exam   GEN: No acute distress.   Neck: No JVD Cardiac: irreg, no murmurs, rubs, or gallops.  Respiratory: Clear to auscultation bilaterally. GI: Soft, nontender, non-distended  MS: No edema; No deformity. Psych: does not answer questions.   Labs    Chemistry Recent Labs  Lab 12/17/17 1105  12/18/17 0424 12/19/17 0421 12/20/17 0439 12/21/17 0414  NA 142 146* 152* 158* 163*  K 4.7 4.6 4.4 3.9 3.8  CL 105 117* 121* 128* >130*  CO2 22 18* 19* 21* 24  GLUCOSE 172* 217* 249* 309* 196*  BUN 105* 98* 92* 86* 67*  CREATININE 2.44* 2.58* 2.15* 1.96* 1.55*  CALCIUM 9.1 7.7* 8.3* 8.6* 8.5*  PROT 8.4* 6.6  --  6.6  --   ALBUMIN 3.0* 2.2*  --  2.2*  --   AST 74* 71*  --  31  --   ALT 30 23  --  22  --   ALKPHOS 45 35*  --  38  --   BILITOT 3.5* 3.8*  --  1.3*  --   GFRNONAA 25* 23* 29* 32* 43*  GFRAA 29* 27* 34* 38* 50*  ANIONGAP 15 11 12 9   --      Hematology Recent Labs  Lab 12/19/17 0421 12/20/17 0439 12/21/17 0414  WBC 28.2* 34.4* 26.1*  RBC 3.42* 3.39* 3.21*  HGB 10.8* 10.9* 10.6*  HCT 33.3* 32.9* 32.0*  MCV 97.4 97.1 99.7  MCH 31.6 32.2 33.0  MCHC 32.4 33.1 33.1  RDW 14.3 14.5 15.0  PLT 42* 30* 26*    Cardiac Enzymes Recent Labs  Lab 12/17/17 1105 12/17/17 1759 12/18/17 0047 12/18/17 0424  TROPONINI 0.90* 0.88* 0.65* 0.54*   No results for input(s): TROPIPOC in the last 168 hours.   BNPNo results for input(s): BNP, PROBNP in the last 168 hours.   DDimer No results for input(s): DDIMER in the last 168 hours.   Radiology    Dg Chest Port 1 View  Result Date: 12/20/2017 CLINICAL DATA:  Aspiration pneumonia, found unresponsive 3 days ago EXAM: PORTABLE CHEST 1 VIEW COMPARISON:  12/18/2017 FINDINGS: Persistent low lung volumes with minor basilar atelectasis. No definite pneumonia, collapse or consolidation. Negative for edema, effusion or pneumothorax. Exam is rotated to the left. Aorta is atherosclerotic. No acute osseous finding. IMPRESSION: Persistent low lung volumes with basilar atelectasis. Electronically Signed   By: Judie Petit.  Shick M.D.   On: 12/20/2017 10:20   Dg Chest Port 1v Same Day  Result Date: 12/20/2017 CLINICAL DATA:  NG tube placement. EXAM: PORTABLE CHEST 1 VIEW COMPARISON:  12/20/2017. FINDINGS: Normal heart size. Clear lung fields. No bony  abnormality. Nasogastric tube has passed through the stomach, with its tip in the second duodenum. IMPRESSION: Enteric placement of nasogastric tube as described. No active cardiopulmonary disease. Electronically Signed   By: Elsie Stain M.D.   On: 12/20/2017 19:24    Cardiac Studies     Patient Profile     Juan Barber is a 72 y.o. male with a hx of chronic systolic HF who is being seen today for the evaluation of elevated troponin at the request of Dr Gwenlyn Perking.    Assessment & Plan    1. Sepsis - from primary note sepsis due to UTI and aspiration pneumonia.  - presented with sepsis and multiorgan failure with AKI, abnormal LFTs and hyperbili, thrombocytopenia, metabolic encephalopathy   2. Elevated troponin - thought to be demand ischemia in the setting of severe sepsis. Normal cath 05/2017  3. Chronic systolic HF - LVEF stable at 40% by echo this admit. NICM, cath 05/2017 without significant CAD -   4. Thrombocytopenia - Plt down to 26 today - avoid all antiplatelets and anticoag  5. AKI - slowly improving.   6. 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 No anticoag or antiplatelets due to severe thrombocytopenia. Had been on xarelto at home - rate control with metoprolol 2.5mg  IV q6 hrs. Patient not taking PO due to dysphagia post CVA. - afib with elevated rates. Soft bp's, cannot titrate lopressor further.   - with low LVEF and afib and improving renal function digoxin is reasonable option. Load 0.25mg  IV x6 hrs x 4 doses. If bp's improve would favor increasing lopressor to 5mg  IV q6 and not using maintence IV dig, but if needed could do IV digoxin 0.125mg  IV every other day. If completely refractory could  load with IV amiodarone.     We will follow patienty remotely, monitor telemetry. Other than rate control no other additional cardiology recs, agree with palliative cave involvement.   For questions or updates, please contact [facility] Please consult [email or web address] for contact info under Cardiology/STEMI.      Joanie Coddington, MD  12/21/2017, 10:15 AM

## 2017-12-21 NOTE — Progress Notes (Signed)
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 Neighbors had not seen him since5/2/19and the police was present at his apartment for another reason and the apartment staff decided to check on the patient. They found him down on the floor unresponsive covered in urine. Brought into the emergency department where he was noted to be in atrial fibrillation with rapid ventricular response. He was given multiple medications including diltiazem and Lopressor to improve his heart rate. Ultimately was started on a diltiazem drip. After that his blood pressure dropped when he spiked a temperature to 1.9 in the emergency department. Blood pressure had been in the 180s systolic and then dropped to the 78/47 range. Received 4 L of IV fluid and his blood pressure has improved to the mid to low 100s currently 107/62. The patient was found to have markedly infected urine, and elevated troponin, elevated blood glucoses, and elevated bilirubin, and evaded CPK.The patient was given vancomycin and cefepime in the emergency department. Chest x-ray showed mild lingular and bilateral lower lobe opacities.He was given adenosine 6 mg and subsequently 12 mg. He was ultimately started on diltiazem drip. Cardiology was consulted for elevated troponins. They felt this to be due to demand ischemia.    Assessment/Plan: Sepsis -Secondary to UTI and aspiration pneumonia -Lactic acid peaked at 6.1 -Procalcitonin to46.76 -continue merrem>>ceftriaxone/clinda -WBC increased--suspect may be due to steroids>>>now improving  Acute metabolic encephalopathy -Multifactorial including sepsis, stroke, acute on chronic  renal failure, hypernatremia -Patient more awake, but aphasic from stroke -EEG--moderate diffuse slowing  UTI--Klebsiella -Continue Meropenem -de-escalate to ceftriaxone  Aspiration pneumonia -Continue meropenem>>>changed to ceftriaxone and clinda -personally reviewed CXR--bilateral lower lobe opacities  Acute embolic stroke -03/14/1913 MRI brain--large acute left ACA territory infarct and a left MCA infarct;multiple foci of acute ischemia in the left basal ganglia, left thalamus, right occipital lobe, right temporal lobe, right midbrain, and bilateral cerebellum -Neurology consult appreciated -12/18/2017 echo EF 40-45%, grade 1 DD, mild MR  Atrial fibrillation with RVR -Patient has converted to sinus rhythm, now back to afib with RVR -Continue IV metoprolol--increase to 2.5 mg q 6 hrs -Currently a poor candidate for anticoagulation, but defer to cardiology and neurology -digoxin added  Hypernatremia -concerned about central diabetes insipidus -serum osm -urine osm -nephrology consulted -continue D5W Rhabdomyolysis, traumatic -CPK peaked at898>>145  Thrombocytopenia -Secondary to sepsis -Holding Lovenox and heparin -Serum B12--2095 -TSH 1.211 -PTT 29 -Check INR--1.25  Acute on chronic renal failure--CKD stage III -Secondary to volume depletion, sepsis, and rhabdomyolysis -Baseline creatinine 1.2-1.4 -Serum creatinine peaked to 2.58  Elevated troponin -Appreciate cardiology consult -Felt to be due to demand ischemia  Neck contusion -MRI cervical spine--negative for fracture. Cervical prevertebral edema more in the superficial soft tissues of the left lateral neck favoring posttraumatic etiology;no soft tissue edema suggestive of ligamentous injury  Impaired Glucose tolerance -HbA1C--5.9 -allow for liberal glycemic control at this point  Goals of Care -appreciate palliative care consult -overall poor prognosis with low likelihood of recover to  pre-morbid condition -12/20/17--discussed with family-->change to DNR -12/21/17--waiting for rest of family from out of state to arrive before transitioning to full comfort -Advance care planning, including the explanation and discussion of advance directives was carried out with  the patient and family.  Code status including explanations of "Full Code" and "DNR" and alternatives were discussed in detail.  Discussion of end-of-life issues including but not limited palliative care, hospice care and the concept of hospice, other end-of-life care options, power of attorney for health care decisions, living wills, and physician orders for life-sustaining treatment were also discussed with the patient and family.  Total face to face time 20 minutes.   The patient is critically ill with multiple organ systems failure and requires high complexity decision making for assessment and support, frequent evaluation and titration of therapies, application of advanced monitoring technologies and extensive interpretation of multiple databases.  Critical care time -44 mins.     Disposition Plan:Residential hospice, once out of state family visits with pt Family Communication:Daughter updated at bedside 5/10  Consultants:Palliative medicine  Code Status: FULL   DVT Prophylaxis: SCDs   Procedures: As Listed in Progress Note Above  Antibiotics: vanco 5/6 Cefepime 5/6 azithro 5/6 Unasyn 5/7>>>5/8 merrem 5/8>>>5/10     Subjective: Patient opens eyes but does not follow commands.  There is no vomiting, respiratory distress, uncontrolled pain.  There is no diarrhea.  Review of systems unobtainable secondary to patient's mental status.  Objective: Vitals:   12/21/17 1300 12/21/17 1400 12/21/17 1424 12/21/17 1500  BP: 130/72   98/62  Pulse: (!) 129   (!) 126  Resp: 12   (!) 21  Temp:  98.7 F (37.1 C)    TempSrc:  Axillary    SpO2: 97%  98% 97%  Weight:      Height:         Intake/Output Summary (Last 24 hours) at 12/21/2017 1739 Last data filed at 12/21/2017 1300 Gross per 24 hour  Intake 2555 ml  Output 725 ml  Net 1830 ml   Weight change: 0.8 kg (1 lb 12.2 oz) Exam:   General:  Pt is alert, does not follow commands appropriately, not in acute distress  HEENT: No icterus, No thrush, No neck mass, [place]/AT  Cardiovascular: IRRR, S1/S2, no rubs, no gallops  Respiratory: Bibasilar rales.  No wheezing.  Good air movement.  Abdomen: Soft/+BS, non tender, non distended, no guarding  Extremities: No edema, No lymphangitis, No petechiae, No rashes, no synovitis   Data Reviewed: I have personally reviewed following labs and imaging studies Basic Metabolic Panel: Recent Labs  Lab 12/17/17 1105 12/17/17 1146 12/18/17 0424 12/19/17 0421 12/20/17 0439 12/21/17 0414  NA 142  --  146* 152* 158* 163*  K 4.7  --  4.6 4.4 3.9 3.8  CL 105  --  117* 121* 128* >130*  CO2 22  --  18* 19* 21* 24  GLUCOSE 172*  --  217* 249* 309* 196*  BUN 105*  --  98* 92* 86* 67*  CREATININE 2.44*  --  2.58* 2.15* 1.96* 1.55*  CALCIUM 9.1  --  7.7* 8.3* 8.6* 8.5*  MG  --  2.8*  --   --   --  3.0*   Liver Function Tests: Recent Labs  Lab 12/17/17 1105 12/18/17 0424 12/20/17 0439  AST 74* 71* 31  ALT 30 23 22   ALKPHOS 45 35* 38  BILITOT 3.5* 3.8* 1.3*  PROT 8.4* 6.6 6.6  ALBUMIN 3.0* 2.2* 2.2*   No results for input(s): LIPASE, AMYLASE in the last 168 hours. No results for input(s): AMMONIA in the last 168 hours. Coagulation Profile: Recent Labs  Lab 12/20/17 0439  INR 1.25   CBC: Recent Labs  Lab 12/17/17 1105 12/18/17 0424 12/19/17 0421 12/20/17 0439 12/21/17 0414  WBC 6.1 26.6* 28.2* 34.4* 26.1*  NEUTROABS  --   --   --   --  24.0*  HGB 13.8 11.0* 10.8* 10.9* 10.6*  HCT 41.4 32.8* 33.3* 32.9* 32.0*  MCV 97.4 97.3 97.4 97.1 99.7  PLT 60* 38* 42* 30* 26*   Cardiac Enzymes: Recent Labs  Lab 12/17/17 1105 12/17/17 1759 12/18/17 0047  12/18/17 0424 12/20/17 0439  CKTOTAL 898*  --   --   --  145  TROPONINI 0.90* 0.88* 0.65* 0.54*  --    BNP: Invalid input(s): POCBNP CBG: Recent Labs  Lab 12/20/17 1933 12/21/17 0025 12/21/17 0502 12/21/17 0815 12/21/17 1145  GLUCAP 169* 141* 162* 181* 190*   HbA1C: Recent Labs    12/20/17 0439  HGBA1C 5.9*   Urine analysis:    Component Value Date/Time   COLORURINE AMBER (A) 12/17/2017 1105   APPEARANCEUR CLOUDY (A) 12/17/2017 1105   LABSPEC 1.015 12/17/2017 1105   PHURINE 5.0 12/17/2017 1105   GLUCOSEU NEGATIVE 12/17/2017 1105   HGBUR LARGE (A) 12/17/2017 1105   BILIRUBINUR NEGATIVE 12/17/2017 1105   KETONESUR 5 (A) 12/17/2017 1105   PROTEINUR 30 (A) 12/17/2017 1105   NITRITE NEGATIVE 12/17/2017 1105   LEUKOCYTESUR MODERATE (A) 12/17/2017 1105   Sepsis Labs: @LABRCNTIP (procalcitonin:4,lacticidven:4) ) Recent Results (from the past 240 hour(s))  Urine culture     Status: Abnormal   Collection Time: 12/17/17 11:05 AM  Result Value Ref Range Status   Specimen Description   Final    URINE, RANDOM Performed at [facility], [address], [place], [place] [address]    Special Requests   Final    NONE Performed at [facility], [address], [place], [place] [address]    Culture >=100,000 COLONIES/mL KLEBSIELLA OXYTOCA (A)  Final   Report Status 12/20/2017 FINAL  Final   Organism ID, Bacteria KLEBSIELLA OXYTOCA (A)  Final      Susceptibility   Klebsiella oxytoca - MIC*    AMPICILLIN >=32 RESISTANT Resistant     CEFAZOLIN >=64 RESISTANT Resistant     CEFTRIAXONE <=1 SENSITIVE Sensitive     CIPROFLOXACIN <=0.25 SENSITIVE Sensitive     GENTAMICIN <=1 SENSITIVE Sensitive     IMIPENEM 0.5 SENSITIVE Sensitive     NITROFURANTOIN 64 INTERMEDIATE Intermediate     TRIMETH/SULFA <=20 SENSITIVE Sensitive     AMPICILLIN/SULBACTAM >=32 RESISTANT Resistant     PIP/TAZO >=128 RESISTANT Resistant     Extended ESBL NEGATIVE Sensitive     * >=100,000 COLONIES/mL  KLEBSIELLA OXYTOCA  Blood culture (routine x 2)     Status: None (Preliminary result)   Collection Time: 12/17/17  7:21 PM  Result Value Ref Range Status   Specimen Description BLOOD RIGHT ARM DRAWN BY RN  Final   Special Requests   Final    BOTTLES DRAWN AEROBIC AND ANAEROBIC Blood Culture adequate volume   Culture   Final    NO GROWTH 4 DAYS Performed at [facility], [address] [place], [place] [address]    Report Status PENDING  Incomplete  Blood culture (routine x 2)     Status: None (Preliminary result)   Collection Time: 12/17/17  7:21 PM  Result Value Ref Range Status   Specimen Description BLOOD RIGHT WRIST DRAWN BY RN  Final   Special Requests   Final    BOTTLES DRAWN AEROBIC AND ANAEROBIC Blood Culture adequate volume   Culture   Final  NO GROWTH 4 DAYS Performed at [facility], [address], [place], [place] [address]    Report Status PENDING  Incomplete  MRSA PCR Screening     Status: Abnormal   Collection Time: 12/17/17  8:08 PM  Result Value Ref Range Status   MRSA by PCR POSITIVE (A) NEGATIVE Final    Comment:        The GeneXpert MRSA Assay (FDA approved for NASAL specimens only), is one component of a comprehensive MRSA colonization surveillance program. It is not intended to diagnose MRSA infection nor to guide or monitor treatment for MRSA infections. RESULT CALLED TO, READ BACK BY AND VERIFIED WITH: AMBURN,A. AT 6045 ON 12/18/2017 BY EVA Performed at [facility], [address], [place], [place] [address]      Scheduled Meds: . Chlorhexidine Gluconate Cloth  6 each Topical Q0600  . digoxin  0.25 mg Intravenous Q6H  . insulin aspart  0-15 Units Subcutaneous Q4H  . levalbuterol  0.63 mg Nebulization TID  . mouth rinse  15 mL Mouth Rinse BID  . metoprolol tartrate  2.5 mg Intravenous Q6H  . mupirocin ointment  1 application Nasal BID  . mupirocin ointment   Topical Daily  . sodium chloride flush  3 mL Intravenous Q12H   Continuous  Infusions: . dextrose 150 mL/hr at 12/21/17 1047  . famotidine (PEPCID) IV Stopped (12/20/17 2202)  . feeding supplement (VITAL AF 1.2 CAL) 1,000 mL (12/21/17 1358)  . meropenem (MERREM) IV Stopped (12/21/17 0940)    Procedures/Studies: Ct Head Wo Contrast  Result Date: 12/17/2017 CLINICAL DATA:  Found down in his apartment. Laceration to left cheek. EXAM: CT HEAD WITHOUT CONTRAST CT MAXILLOFACIAL WITHOUT CONTRAST CT CERVICAL SPINE WITHOUT CONTRAST TECHNIQUE: Multidetector CT imaging of the head, cervical spine, and maxillofacial structures were performed using the standard protocol without intravenous contrast. Multiplanar CT image reconstructions of the cervical spine and maxillofacial structures were also generated. COMPARISON:  Cervical spine x-rays dated August 03, 2008. 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 No evidence of hemorrhage, hydrocephalus, extra-axial collection or mass lesion/mass effect. Mild to moderate generalized cerebral atrophy. Mild scattered periventricular and subcortical white matter hypodensities are nonspecific, but favored to reflect chronic microvascular ischemic changes. Vascular: Atherosclerotic vascular calcification of the carotid siphons. No hyperdense vessel. Skull: Normal. Negative for fracture or focal lesion. Other: Small left frontal scalp hematoma. CT MAXILLOFACIAL FINDINGS Osseous: No fracture or mandibular dislocation. No destructive process. Periapical lucencies involving the right maxillary first and second molars. Orbits: Negative. No traumatic or inflammatory finding. Sinuses: Chronic appearing mild-to-moderate moderate mucosal thickening of the paranasal sinuses, moderate in the left maxillary sinus. The mastoid air cells are clear. Soft tissues: Left cheek laceration. CT CERVICAL SPINE FINDINGS Alignment: Reversal of the normal cervical lordosis, centered at C5, unchanged. No  traumatic malalignment. Skull base and vertebrae: No acute cervical spine fracture. Mild anterior superior endplate height loss at T1 with associated Schmorl's node. No primary bone lesion or focal pathologic process. Soft tissues and spinal canal: Mild prevertebral soft tissue thickening. Subcutaneous stranding and fluid in the left neck. No visible canal hematoma. Disc levels: Moderate to severe disc height loss at C2-C3, C5-C6, and C6-C7, progressed when compared to prior study. Mild bilateral neuroforaminal stenosis at C5-C6 and C6-C7. Upper chest: Negative. Other: 2.9 cm superficial subcutaneous cystic lesion in the right posterior neck, likely a sebaceous cyst. IMPRESSION: 1. 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  2. No acute cervical spine fracture. Mild prevertebral soft tissue thickening and subcutaneous stranding/fluid in the left neck. Ligamentous and/or soft tissue injury is not excluded. Recommend MRI cervical spine for further evaluation. 3. Mild anterior superior endplate height loss at T1 with associated Schmorl's node is favored chronic. 4.  No acute facial fracture. 5. Small left frontal scalp hematoma.  Left cheek laceration. Electronically Signed   By: Obie Dredge M.D.   On: 12/17/2017 12:06   Ct Cervical Spine Wo Contrast  Result Date: 12/17/2017 CLINICAL DATA:  Found down in his apartment. Laceration to left cheek. EXAM: CT HEAD WITHOUT CONTRAST CT MAXILLOFACIAL WITHOUT CONTRAST CT CERVICAL SPINE WITHOUT CONTRAST TECHNIQUE: Multidetector CT imaging of the head, cervical spine, and maxillofacial structures were performed using the standard protocol without intravenous contrast. Multiplanar CT image reconstructions of the cervical spine and maxillofacial structures were also generated. COMPARISON:  Cervical spine x-rays dated August 03, 2008. FINDINGS: CT HEAD FINDINGS Brain: Age-indeterminate lacunar infarcts in the left thalamus and right  cerebellum. No evidence of hemorrhage, hydrocephalus, extra-axial collection or mass lesion/mass effect. Mild to moderate generalized cerebral atrophy. Mild scattered periventricular and subcortical white matter hypodensities are nonspecific, but favored to reflect chronic microvascular ischemic changes. Vascular: Atherosclerotic vascular calcification of the carotid siphons. No hyperdense vessel. Skull: Normal. Negative for fracture or focal lesion. Other: Small left frontal scalp hematoma. CT MAXILLOFACIAL FINDINGS Osseous: No fracture or mandibular dislocation. No destructive process. Periapical lucencies involving the right maxillary first and second molars. Orbits: Negative. No traumatic or inflammatory finding. Sinuses: Chronic appearing mild-to-moderate moderate mucosal thickening of the paranasal sinuses, moderate in the left maxillary sinus. The mastoid air cells are clear. Soft tissues: Left cheek laceration. CT CERVICAL SPINE FINDINGS Alignment: Reversal of the normal cervical lordosis, centered at C5, unchanged. No traumatic malalignment. Skull base and vertebrae: No acute cervical spine fracture. Mild anterior superior endplate height loss at T1 with associated Schmorl's node. No primary bone lesion or focal pathologic process. Soft tissues and spinal canal: Mild prevertebral soft tissue thickening. Subcutaneous stranding and fluid in the left neck. No visible canal hematoma. Disc levels: Moderate to severe disc height loss at C2-C3, C5-C6, and C6-C7, progressed when compared to prior study. Mild bilateral neuroforaminal stenosis at C5-C6 and C6-C7. Upper chest: Negative. Other: 2.9 cm superficial subcutaneous cystic lesion in the right posterior neck, likely a sebaceous cyst. IMPRESSION: 1. 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 2. No acute cervical spine fracture. Mild prevertebral soft tissue thickening and subcutaneous stranding/fluid in the left neck.  Ligamentous and/or soft tissue injury is not excluded. Recommend MRI cervical spine for further evaluation. 3. Mild anterior superior endplate height loss at T1 with associated Schmorl's node is favored chronic. 4.  No acute facial fracture. 5. Small left frontal scalp hematoma.  Left cheek laceration. Electronically Signed   By: Obie Dredge M.D.   On: 12/17/2017 12:06   Mr Maxine Glenn Head Wo Contrast  Result Date: 12/18/2017 CLINICAL DATA:  Altered mental status EXAM: MRI HEAD WITHOUT CONTRAST MRA HEAD WITHOUT CONTRAST TECHNIQUE: Multiplanar, multiecho pulse sequences of the brain and surrounding structures were obtained without intravenous contrast. Angiographic images of the head were obtained using MRA technique without contrast. COMPARISON:  None. FINDINGS: MRI HEAD FINDINGS Brain: Multifocal abnormal diffusion restriction. The largest areas involve the left anterior and middle cerebral artery territories. There are smaller infarct scattered within the left deep gray structures, right occipital lobe, right temporal lobe, right midbrain and both cerebellar  hemispheres. Extensive hyperintense T2-weighted signal corresponds to the areas of ischemia. There is no midline shift. No acute hemorrhage. No mass lesion. No chronic microhemorrhage or cerebral amyloid angiopathy. No hydrocephalus, age advanced atrophy or lobar predominant volume loss. No dural abnormality or extra-axial collection. Skull and upper cervical spine: The visualized skull base, calvarium, upper cervical spine and extracranial soft tissues are normal. Sinuses/Orbits: Moderate left maxillary sinus mucosal disease. Small amount of bilateral mastoid fluid. Normal orbits. MRA HEAD FINDINGS Intracranial internal carotid arteries: Normal. Anterior cerebral arteries: The left anterior cerebral arteries occluded of the proximal A2 segment. Normal right ACA. Middle cerebral arteries: There is occlusion of the left middle cerebral artery inferior division.  Superior division and M1 segment are normal. Right MCA is normal. Posterior communicating arteries: Present bilaterally. Posterior cerebral arteries: Bilateral fetal origins.  Normal. Basilar artery: Normal. Vertebral arteries: Left dominant. Distal right vertebral artery appears occluded or severely stenotic. Superior cerebellar arteries: Normal. Anterior inferior cerebellar arteries: Normal. Posterior inferior cerebellar arteries: Nonvisualization of right PICA. Normal left. IMPRESSION: 1. 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 No acute hemorrhage or herniation. 2. Multiple small foci of acute ischemia within the left basal ganglia, left thalamus, right occipital lobe, right temporal lobe, right midbrain and bilaterally within the cerebellum. 3. 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 4. Occlusion of the proximal to midportion of the left anterior cerebral artery A2 segment. Electronically Signed   By: Deatra Robinson M.D.   On: 12/18/2017 17:49   Mr Brain Wo Contrast  Result Date: 12/18/2017 CLINICAL DATA:  Altered mental status EXAM: MRI HEAD WITHOUT CONTRAST MRA HEAD WITHOUT CONTRAST TECHNIQUE: Multiplanar, multiecho pulse sequences of the brain and surrounding structures were obtained without intravenous contrast. Angiographic images of the head were obtained using MRA technique without contrast. COMPARISON:  None. FINDINGS: MRI HEAD FINDINGS Brain: Multifocal abnormal diffusion restriction. The largest areas involve the left anterior and middle cerebral artery territories. There are smaller infarct scattered within the left deep gray structures, right occipital lobe, right temporal lobe, right midbrain and both cerebellar hemispheres. Extensive hyperintense T2-weighted signal corresponds to the areas of ischemia. There is no midline shift. No acute hemorrhage. No mass lesion. No chronic  microhemorrhage or cerebral amyloid angiopathy. No hydrocephalus, age advanced atrophy or lobar predominant volume loss. No dural abnormality or extra-axial collection. Skull and upper cervical spine: The visualized skull base, calvarium, upper cervical spine and extracranial soft tissues are normal. Sinuses/Orbits: Moderate left maxillary sinus mucosal disease. Small amount of bilateral mastoid fluid. Normal orbits. MRA HEAD FINDINGS Intracranial internal carotid arteries: Normal. Anterior cerebral arteries: The left anterior cerebral arteries occluded of the proximal A2 segment. Normal right ACA. Middle cerebral arteries: There is occlusion of the left middle cerebral artery inferior division. Superior division and M1 segment are normal. Right MCA is normal. Posterior communicating arteries: Present bilaterally. Posterior cerebral arteries: Bilateral fetal origins.  Normal. Basilar artery: Normal. Vertebral arteries: Left dominant. Distal right vertebral artery appears occluded or severely stenotic. Superior cerebellar arteries: Normal. Anterior inferior cerebellar arteries: Normal. Posterior inferior cerebellar arteries: Nonvisualization of right PICA. Normal left. IMPRESSION: 1. 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 No acute hemorrhage or herniation. 2. Multiple small foci of acute ischemia within the left basal ganglia, left thalamus, right occipital lobe, right temporal lobe, right midbrain and bilaterally within the cerebellum. 3. Occlusion  of the left middle cerebral artery M2 segment inferior division. 4. Occlusion of the proximal to midportion of the left anterior cerebral artery A2 segment. Electronically Signed   By: Deatra Robinson M.D.   On: 12/18/2017 17:49   Mr Cervical Spine Wo Contrast  Addendum Date: 12/18/2017   ADDENDUM REPORT: 12/18/2017 12:32 ADDENDUM: Old cerebellar and pontine infarcts with abnormal appearance  of the distal right vertebral artery which may reflect slow flow or occlusion. Electronically Signed   By: Sebastian Ache M.D.   On: 12/18/2017 12:32   Result Date: 12/18/2017 CLINICAL DATA:  Found down on floor unresponsive. Prevertebral swelling on CT. EXAM: MRI CERVICAL SPINE WITHOUT CONTRAST TECHNIQUE: Multiplanar, multisequence MR imaging of the cervical spine was performed. No intravenous contrast was administered. COMPARISON:  Cervical spine CT 12/17/2017 FINDINGS: Alignment: Mild reversal the normal cervical lordosis. Trace retrolisthesis of C2 on C3, C5 on C6, and C6 on C7. Vertebrae: T1 superior endplate Schmorl's node without marrow edema. Advanced disc degeneration at C5-6 and C6-7 with T2/stir hyperintensity diffusely throughout both disc spaces and mild endplate edema. Cord: Normal signal. Posterior Fossa, vertebral arteries, paraspinal tissues: Prevertebral fluid extends from C2-C7 and measures up to 1 cm in AP thickness at the C3-4 level. Edema in the more superficial soft tissues of the left lateral neck was more fully imaged on the earlier CT and may favor a posttraumatic etiology for the prevertebral fluid. No discrete anterior longitudinal ligament disruption is identified. There is no soft tissue edema suggestive of posterior ligamentous complex injury. 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 Preserved vertebral artery flow voids in the neck. Abnormal appearance of the right V4 segment. 2.9 cm T2 hyperintense subcutaneous lesion in the posterior right lower neck, likely a sebaceous cyst. Disc levels: C2-3: Severe disc space narrowing. Disc bulging, uncovertebral spurring, infolding of the ligamentum flavum, and moderate right facet arthrosis result in moderate spinal stenosis and mild-to-moderate right and severe left neural foraminal stenosis. C3-4: Small to moderate-sized central disc extrusion with mild superior migration and infolding of the  ligamentum flavum result in moderate spinal stenosis. No significant neural foraminal stenosis. C4-5: Broad posterior disc protrusion results in mild spinal stenosis. Mild bilateral neural foraminal stenosis. C5-6: Severe disc space narrowing. Broad-based posterior disc osteophyte complex results in mild-to-moderate spinal stenosis and severe bilateral neural foraminal stenosis. C6-7: Severe disc space narrowing. Broad-based posterior disc osteophyte complex results in mild-to-moderate spinal stenosis and severe bilateral neural foraminal stenosis. C7-T1: Minimal disc bulging and mild facet arthrosis without stenosis. IMPRESSION: 1. Diffuse cervical prevertebral fluid/edema. This could reflect posttraumatic effusion and soft tissue injury versus early infectious discitis at C5-6 and C6-7. 2. 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 Electronically Signed: By: Sebastian Ache M.D. On: 12/18/2017 11:44   Dg Chest Port 1 View  Result Date: 12/20/2017 CLINICAL DATA:  Aspiration pneumonia, found unresponsive 3 days ago EXAM: PORTABLE CHEST 1 VIEW COMPARISON:  12/18/2017 FINDINGS: Persistent low lung volumes with minor basilar atelectasis. No definite pneumonia, collapse or consolidation. Negative for edema, effusion or pneumothorax. Exam is rotated to the left. Aorta is atherosclerotic. No acute osseous finding. IMPRESSION: Persistent low lung volumes with basilar atelectasis. Electronically Signed   By: Judie Petit.  Shick M.D.   On: 12/20/2017 10:20   Portable Chest X-ray 1 View  Result Date: 12/18/2017 CLINICAL DATA:  Sepsis, AFib EXAM: PORTABLE CHEST 1 VIEW COMPARISON:  12/17/2017 FINDINGS: Mild  lingular and bilateral lower lobe opacities, likely atelectasis, although pneumonia remains possible. No frank interstitial edema.  No pleural effusion or pneumothorax. The heart is normal in size. IMPRESSION: Mild lingular and bilateral lower  lobe opacities, likely atelectasis, although pneumonia remains possible. Electronically Signed   By: Charline Bills M.D.   On: 12/18/2017 07:35   Dg Chest Port 1 View  Result Date: 12/17/2017 CLINICAL DATA:  Chest pain, altered mental status EXAM: PORTABLE CHEST 1 VIEW COMPARISON:  Chest x-ray of 05/18/2017 FINDINGS: No active infiltrate or effusion is seen. Mediastinal and hilar contours are unremarkable. The heart is borderline enlarged and stable. No bony abnormality is seen. IMPRESSION: No active lung disease.  Heart upper normal in size. Electronically Signed   By: Dwyane Dee M.D.   On: 12/17/2017 11:56   Dg Chest Port 1v Same Day  Result Date: 12/20/2017 CLINICAL DATA:  NG tube placement. EXAM: PORTABLE CHEST 1 VIEW COMPARISON:  12/20/2017. FINDINGS: Normal heart size. Clear lung fields. No bony abnormality. Nasogastric tube has passed through the stomach, with its tip in the second duodenum. IMPRESSION: Enteric placement of nasogastric tube as described. No active cardiopulmonary disease. Electronically Signed   By: Elsie Stain M.D.   On: 12/20/2017 19:24   Dg Chest Port 1v Same Day  Result Date: 12/17/2017 CLINICAL DATA:  72 year old male with code sepsis, and evolving lung sounds EXAM: PORTABLE CHEST 1 VIEW COMPARISON:  Chest x-ray obtained earlier today at 11:40 a.m. FINDINGS: Significantly lower inspiratory volumes with developing lingular and left lower lobe streaky airspace opacities favored to reflect atelectasis. No overt pulmonary edema, pneumothorax or pleural effusion. Stable cardiac and mediastinal contours. No acute osseous abnormality. IMPRESSION: 1. Lower inspiratory volumes with developing streaky lingular and left lower lobe airspace opacities favored to reflect atelectasis. Electronically Signed   By: Malachy Moan M.D.   On: 12/17/2017 18:08   Ct Maxillofacial Wo Cm  Result Date: 12/17/2017 CLINICAL DATA:  Found down in his apartment. Laceration to left cheek. EXAM: CT  HEAD WITHOUT CONTRAST CT MAXILLOFACIAL WITHOUT CONTRAST CT CERVICAL SPINE WITHOUT CONTRAST TECHNIQUE: Multidetector CT imaging of the head, cervical spine, and maxillofacial structures were performed using the standard protocol without intravenous contrast. Multiplanar CT image reconstructions of the cervical spine and maxillofacial structures were also generated. COMPARISON:  Cervical spine x-rays dated August 03, 2008. 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 No evidence of hemorrhage, hydrocephalus, extra-axial collection or mass lesion/mass effect. Mild to moderate generalized cerebral atrophy. Mild scattered periventricular and subcortical white matter hypodensities are nonspecific, but favored to reflect chronic microvascular ischemic changes. Vascular: Atherosclerotic vascular calcification of the carotid siphons. No hyperdense vessel. Skull: Normal. Negative for fracture or focal lesion. Other: Small left frontal scalp hematoma. CT MAXILLOFACIAL FINDINGS Osseous: No fracture or mandibular dislocation. No destructive process. Periapical lucencies involving the right maxillary first and second molars. Orbits: Negative. No traumatic or inflammatory finding. Sinuses: Chronic appearing mild-to-moderate moderate mucosal thickening of the paranasal sinuses, moderate in the left maxillary sinus. The mastoid air cells are clear. Soft tissues: Left cheek laceration. CT CERVICAL SPINE FINDINGS Alignment: Reversal of the normal cervical lordosis, centered at C5, unchanged. No traumatic malalignment. Skull base and vertebrae: No acute cervical spine fracture. Mild anterior superior endplate height loss at T1 with associated Schmorl's node. No primary bone lesion or focal pathologic process. Soft tissues and spinal canal: Mild prevertebral soft tissue thickening. Subcutaneous stranding and fluid in the left neck.  No visible canal hematoma. Disc  levels: Moderate to severe disc height loss at C2-C3, C5-C6, and C6-C7, progressed when compared to prior study. Mild bilateral neuroforaminal stenosis at C5-C6 and C6-C7. Upper chest: Negative. Other: 2.9 cm superficial subcutaneous cystic lesion in the right posterior neck, likely a sebaceous cyst. IMPRESSION: 1. 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 2. No acute cervical spine fracture. Mild prevertebral soft tissue thickening and subcutaneous stranding/fluid in the left neck. Ligamentous and/or soft tissue injury is not excluded. Recommend MRI cervical spine for further evaluation. 3. Mild anterior superior endplate height loss at T1 with associated Schmorl's node is favored chronic. 4.  No acute facial fracture. 5. Small left frontal scalp hematoma.  Left cheek laceration. Electronically Signed   By: Obie Dredge M.D.   On: 12/17/2017 12:06    Catarina Hartshorn, DO  [facility] Pager [phone]  If 7PM-7AM, please contact night-coverage [email or web address] Password [email or web address] 12/21/2017, 5:39 PM   LOS: 4 days

## 2017-12-21 NOTE — Progress Notes (Signed)
CRITICAL VALUE ALERT  Critical Value:  Sodium 163 and chloride 133  Date & Time Notied:  12/21/2017  0630  Provider Notified: Robb Matar MD  Orders Received/Actions taken: None at this time

## 2017-12-21 NOTE — Consult Note (Signed)
Reason for Consult: Hyponatremia and acute kidney injury Referring Physician: [name] is an 72 y.o. male.  HPI: He is a patient who has history of atrial fibrillation, nonischemic cardiomyopathy and initially found at home unresponsive lying on the floor.  When he was evaluated patient was found to have elevated CPK, blood sugar.  Further work-up revealed acute infarct.  Presently consult is called because of hypernatremia.  Past Medical History:  Diagnosis Date  . Atrial fibrillation ([place])   . Nonischemic cardiomyopathy ([place])    a. echo in 05/2017 showing reduced EF of 30-35% - cath showing normal cors  . Seizures ([place])     Past Surgical History:  Procedure Laterality Date  . HERNIA REPAIR    . LEFT HEART CATH AND CORONARY ANGIOGRAPHY N/A 06/05/2017   Procedure: LEFT HEART CATH AND CORONARY ANGIOGRAPHY;  Surgeon: Martinique, Peter M, MD;  Location: [facility];  Service: Cardiovascular;  Laterality: N/A;    Family History  Problem Relation Age of Onset  . Heart attack Mother        had AMI in 90s  . AAA (abdominal aortic aneurysm) Neg Hx     Social History:  reports that he has never smoked. He has never used smokeless tobacco. He reports that he drinks alcohol. He reports that he does not use drugs.  Allergies:  Allergies  Allergen Reactions  . Other     Pt reports "older" medication used for migraines  . Talwin [Pentazocine]     Sweating, rapid heartbeat    Medications: I have reviewed the patient's current medications.  Results for orders placed or performed during the hospital encounter of 12/17/17 (from the past 48 hour(s))  Glucose, capillary     Status: Abnormal   Collection Time: 12/19/17 12:02 PM  Result Value Ref Range   Glucose-Capillary 224 (H) 65 - 99 mg/dL  Glucose, capillary     Status: Abnormal   Collection Time: 12/19/17  5:07 PM  Result Value Ref Range   Glucose-Capillary 226 (H) 65 - 99 mg/dL  Glucose, capillary     Status:  Abnormal   Collection Time: 12/19/17  7:45 PM  Result Value Ref Range   Glucose-Capillary 241 (H) 65 - 99 mg/dL  Glucose, capillary     Status: Abnormal   Collection Time: 12/20/17 12:26 AM  Result Value Ref Range   Glucose-Capillary 244 (H) 65 - 99 mg/dL  Glucose, capillary     Status: Abnormal   Collection Time: 12/20/17  3:50 AM  Result Value Ref Range   Glucose-Capillary 282 (H) 65 - 99 mg/dL  Comprehensive metabolic panel     Status: Abnormal   Collection Time: 12/20/17  4:39 AM  Result Value Ref Range   Sodium 158 (H) 135 - 145 mmol/L   Potassium 3.9 3.5 - 5.1 mmol/L   Chloride 128 (H) 101 - 111 mmol/L   CO2 21 (L) 22 - 32 mmol/L   Glucose, Bld 309 (H) 65 - 99 mg/dL   BUN 86 (H) 6 - 20 mg/dL   Creatinine, Ser 1.96 (H) 0.61 - 1.24 mg/dL   Calcium 8.6 (L) 8.9 - 10.3 mg/dL   Total Protein 6.6 6.5 - 8.1 g/dL   Albumin 2.2 (L) 3.5 - 5.0 g/dL   AST 31 15 - 41 U/L   ALT 22 17 - 63 U/L   Alkaline Phosphatase 38 38 - 126 U/L   Total Bilirubin 1.3 (H) 0.3 - 1.2 mg/dL  GFR calc non Af Amer 32 (L) >60 mL/min   GFR calc Af Amer 38 (L) >60 mL/min    Comment: (NOTE) The eGFR has been calculated using the CKD EPI equation. This calculation has not been validated in all clinical situations. eGFR's persistently <60 mL/min signify possible Chronic Kidney Disease.    Anion gap 9 5 - 15    Comment: Performed at [facility], [address], [place], [place] [address]  CBC     Status: Abnormal   Collection Time: 12/20/17  4:39 AM  Result Value Ref Range   WBC 34.4 (H) 4.0 - 10.5 K/uL    Comment: WHITE COUNT CONFIRMED ON SMEAR   RBC 3.39 (L) 4.22 - 5.81 MIL/uL   Hemoglobin 10.9 (L) 13.0 - 17.0 g/dL   HCT 32.9 (L) 39.0 - 52.0 %   MCV 97.1 78.0 - 100.0 fL   MCH 32.2 26.0 - 34.0 pg   MCHC 33.1 30.0 - 36.0 g/dL   RDW 14.5 11.5 - 15.5 %   Platelets 30 (L) 150 - 400 K/uL    Comment: SPECIMEN CHECKED FOR CLOTS PLATELET COUNT CONFIRMED BY SMEAR CONSISTENT WITH PREVIOUS RESULT Performed  at [facility], [address], [place], [place] [address]   CK     Status: None   Collection Time: 12/20/17  4:39 AM  Result Value Ref Range   Total CK 145 49 - 397 U/L    Comment: Performed at [facility], [address] [place], [place] [address]  Protime-INR     Status: Abnormal   Collection Time: 12/20/17  4:39 AM  Result Value Ref Range   Prothrombin Time 15.6 (H) 11.4 - 15.2 seconds   INR 1.25     Comment: Performed at [facility], [address], [place], [place] [address]  Vitamin B12     Status: Abnormal   Collection Time: 12/20/17  4:39 AM  Result Value Ref Range   Vitamin B-12 2,095 (H) 180 - 914 pg/mL    Comment: (NOTE) This assay is not validated for testing neonatal or myeloproliferative syndrome specimens for Vitamin B12 levels. Performed at [facility], [place] [address], [place], [place] [address]   Hemoglobin A1c     Status: Abnormal   Collection Time: 12/20/17  4:39 AM  Result Value Ref Range   Hgb A1c MFr Bld 5.9 (H) 4.8 - 5.6 %    Comment: (NOTE) Pre diabetes:          5.7%-6.4% Diabetes:              >6.4% Glycemic control for   <7.0% adults with diabetes    Mean Plasma Glucose 122.63 mg/dL    Comment: Performed at [facility] [address], [place], [place] [address]  Glucose, capillary     Status: Abnormal   Collection Time: 12/20/17  7:51 AM  Result Value Ref Range   Glucose-Capillary 271 (H) 65 - 99 mg/dL  Glucose, capillary     Status: Abnormal   Collection Time: 12/20/17  9:18 AM  Result Value Ref Range   Glucose-Capillary 280 (H) 65 - 99 mg/dL  Glucose, capillary     Status: Abnormal   Collection Time: 12/20/17 11:48 AM  Result Value Ref Range   Glucose-Capillary 251 (H) 65 - 99 mg/dL  Glucose, capillary     Status: Abnormal   Collection Time: 12/20/17  4:44 PM  Result Value Ref Range   Glucose-Capillary 194 (H) 65 - 99 mg/dL  Glucose, capillary     Status: Abnormal   Collection Time: 12/20/17  7:33 PM  Result Value Ref  Range   Glucose-Capillary 169 (H) 65 - 99 mg/dL  Glucose, capillary     Status: Abnormal   Collection Time: 12/21/17 12:25 AM  Result Value Ref Range   Glucose-Capillary 141 (H) 65 - 99 mg/dL  Basic metabolic panel     Status: Abnormal   Collection Time: 12/21/17  4:14 AM  Result Value Ref Range   Sodium 163 (HH) 135 - 145 mmol/L    Comment: CRITICAL RESULT CALLED TO, READ BACK BY AND VERIFIED WITH: ACORD,B AT 6:30AM ON 12/21/17 BY FESTERMAN,C    Potassium 3.8 3.5 - 5.1 mmol/L   Chloride >130 (HH) 101 - 111 mmol/L    Comment: CRITICAL RESULT CALLED TO, READ BACK BY AND VERIFIED WITH: ACORD,B AT 6:30AM ON 12/21/17 BY FESTERMAN,C    CO2 24 22 - 32 mmol/L   Glucose, Bld 196 (H) 65 - 99 mg/dL   BUN 67 (H) 6 - 20 mg/dL   Creatinine, Ser 1.55 (H) 0.61 - 1.24 mg/dL   Calcium 8.5 (L) 8.9 - 10.3 mg/dL   GFR calc non Af Amer 43 (L) >60 mL/min   GFR calc Af Amer 50 (L) >60 mL/min    Comment: (NOTE) The eGFR has been calculated using the CKD EPI equation. This calculation has not been validated in all clinical situations. eGFR's persistently <60 mL/min signify possible Chronic Kidney Disease. Performed at [facility], [address] [place], [place] [address]   CBC with Differential/Platelet     Status: Abnormal   Collection Time: 12/21/17  4:14 AM  Result Value Ref Range   WBC 26.1 (H) 4.0 - 10.5 K/uL   RBC 3.21 (L) 4.22 - 5.81 MIL/uL   Hemoglobin 10.6 (L) 13.0 - 17.0 g/dL   HCT 32.0 (L) 39.0 - 52.0 %   MCV 99.7 78.0 - 100.0 fL   MCH 33.0 26.0 - 34.0 pg   MCHC 33.1 30.0 - 36.0 g/dL   RDW 15.0 11.5 - 15.5 %   Platelets 26 (LL) 150 - 400 K/uL    Comment: REPEATED TO VERIFY CRITICAL RESULT CALLED TO, READ BACK BY AND VERIFIED WITH: WAGONER R. @ [phone] ON [id number] BY HENDERSON L. PLATELET COUNT CONFIRMED BY SMEAR SPECIMEN CHECKED FOR CLOTS    Neutrophils Relative % 92 %   Lymphocytes Relative 5 %   Monocytes Relative 3 %   Eosinophils Relative 0 %   Basophils Relative 0 %    Neutro Abs 24.0 (H) 1.7 - 7.7 K/uL   Lymphs Abs 1.3 0.7 - 4.0 K/uL   Monocytes Absolute 0.8 0.1 - 1.0 K/uL   Eosinophils Absolute 0.0 0.0 - 0.7 K/uL   Basophils Absolute 0.0 0.0 - 0.1 K/uL   RBC Morphology RARE NRBCs     Comment: ANISOCYTES   WBC Morphology WHITE COUNT CONFIRMED ON SMEAR     Comment: INCREASED BANDS (>20% BANDS) PELGEROID CHANGES Performed at [facility], [address] [place], [place] [address]   Magnesium     Status: Abnormal   Collection Time: 12/21/17  4:14 AM  Result Value Ref Range   Magnesium 3.0 (H) 1.7 - 2.4 mg/dL    Comment: Performed at [facility], [address] [place],  [address]  Glucose, capillary     Status: Abnormal   Collection Time: 12/21/17  5:02 AM  Result Value Ref Range   Glucose-Capillary 162 (H) 65 - 99  mg/dL  Glucose, capillary     Status: Abnormal   Collection Time: 12/21/17  8:15 AM  Result Value Ref Range   Glucose-Capillary 181 (H) 65 - 99 mg/dL    Dg Chest Port 1 View  Result Date: 12/20/2017 CLINICAL DATA:  Aspiration pneumonia, found unresponsive 3 days ago EXAM: PORTABLE CHEST 1 VIEW COMPARISON:  12/18/2017 FINDINGS: Persistent low lung volumes with minor basilar atelectasis. No definite pneumonia, collapse or consolidation. Negative for edema, effusion or pneumothorax. Exam is rotated to the left. Aorta is atherosclerotic. No acute osseous finding. IMPRESSION: Persistent low lung volumes with basilar atelectasis. Electronically Signed   By: Jerilynn Mages.  Shick M.D.   On: 12/20/2017 10:20   Dg Chest Port 1v Same Day  Result Date: 12/20/2017 CLINICAL DATA:  NG tube placement. EXAM: PORTABLE CHEST 1 VIEW COMPARISON:  12/20/2017. FINDINGS: Normal heart size. Clear lung fields. No bony abnormality. Nasogastric tube has passed through the stomach, with its tip in the second duodenum. IMPRESSION: Enteric placement of nasogastric tube as described. No active cardiopulmonary disease. Electronically Signed   By: Staci Righter M.D.   On:  12/20/2017 19:24    Review of Systems  Unable to perform ROS: Mental status change   Blood pressure 108/67, pulse (!) 122, temperature 98.5 F (36.9 C), temperature source Oral, resp. rate 16, height '5\' 10"'$  (1.778 m), weight 75.6 kg (166 lb 10.7 oz), SpO2 99 %. Physical Exam  Constitutional: No distress.  Eyes: Left eye exhibits no discharge.  Neck: No JVD present.  Cardiovascular: Normal rate and regular rhythm.  Respiratory: He has no wheezes. He has no rales.  GI: He exhibits no distension. There is no tenderness.  Musculoskeletal: He exhibits no edema.  Neurological:  Patient is awake but does not comujicate.Shows mild response  to pain by try to withdraw other wise no significant response    Assessment/Plan: 1] hyponatremia: Sodium on admission i.e. on 12/17/2017 was 142.  Progressively increased from 1 46-1 52 and then 158.  Presently his sodium is 163.  Patient is presently on D5 water but before that was on D5 water with half normal saline.  At this moment there is no urine osmolality and serum osmolality hence difficult to determine whether this is due to renal free water loss from diabetic insipidus [central versus nephrogenic] or extrarenal lack and lack of free water intake 2] acute kidney injury: His renal function is progressively improving.  Possibly prerenal/ATN. Patient is none oliguric 3] sepsis: UTI versus aspiration pneumonia. 4] altered mental status from ACA and left RCA infarct/hypernatrmia 5] history of atrial fibrillation:  6] history of seizure disorder Plan: 1]We will check urine and serum osmolality 2] depending on the finding we may proceed with DI work-up. 3] we will check his renal panel in the morning    Tymir Terral S 12/21/2017, 11:12 AM

## 2017-12-21 NOTE — Progress Notes (Signed)
Nutrition Follow-up  DOCUMENTATION CODES:      INTERVENTION:  Initiate Vital 1.2  @ 55 ml/hr (1320 ml every 24 hr)  via NGT   Tube feeding regimen provides 1584 kcal, 99 grams of protein, and 1010 ml of H2O.    D5 @ 150 ml/hr (providing: 3.6 liters -612 kcal every 24 hr)  Total kcal: D5 + TF =2196  Total volume= D5 + TF=4.6 liters   NUTRITION DIAGNOSIS:   Increased nutrient needs related to acute illness(sepsis (UTI), rhabdomyolysis, asp. pneumonia) as evidenced by estimated needs.  GOAL:   Provide needs based on ASPEN/SCCM guidelines(If feasible and agreeable once patient goals of care are established)   MONITOR:   Diet advancement, Skin, Weight trends, I & O's, PO intake  REASON FOR ASSESSMENT:   New TF    ASSESSMENT:  Presents with sepsis secondary to UTI, acute renal failure related to rhabdomyolysis, aspiration pneumonia, closed fx right radius, elevated troponin. Hx of chronic CHF    MRI : cervical edema, advanced disc degeneration C5-6 &C-6-7. CT Cervical spine no acute facial fx. He was found face down in his home.  ST assessment completed: Patient has dysphagia. NPO recommended at this time. Identified as severe aspiration risk. ST suspects neurologically related dysphagia. Patient apparently lives alone. Unclear at this time what if any family support he has.   Palliative consulted- met briefly with son and daughter and is following . His weight is down slightly. Unsure at this time how long the patient was on the floor without food or drink or what his usual intake is. He is not responding to questions at this time. Discussed pt with nursing. RD will follow up tomorrow.  5/8 Discussed with nursing. Patient remains poorly responsive per MD. Neurology has assessed pt today -abnormal EEG findings. His glucose and sodium levels are trending up.  He remains NPO.  Palliative is also following and has been addressing code status and goals of care. It is unclear  at this time whether pt will be appropriate for nutrition support.   5/10- verbal order received to initiate tube feedings.  Significant hypernatremia and hyperglycemia. Per neuro pt has multiple ischemic infarcts and generally unresponsive. Discussed with nursing. D5 @ 150 ml/hr started this morning. Patient has a NGT placed. Nephrology has been consulted.    Intake/Output Summary (Last 24 hours) at 12/21/2017 1146 Last data filed at 12/21/2017 0910 Gross per 24 hour  Intake 1630 ml  Output 1075 ml  Net 555 ml     Labs: BMP Latest Ref Rng & Units 12/21/2017 12/20/2017 12/19/2017  Glucose 65 - 99 mg/dL 196(H) 309(H) 249(H)  BUN 6 - 20 mg/dL 67(H) 86(H) 92(H)  Creatinine 0.61 - 1.24 mg/dL 1.55(H) 1.96(H) 2.15(H)  BUN/Creat Ratio 6 - 22 (calc) - - -  Sodium 135 - 145 mmol/L 163(HH) 158(H) 152(H)  Potassium 3.5 - 5.1 mmol/L 3.8 3.9 4.4  Chloride 101 - 111 mmol/L >130(HH) 128(H) 121(H)  CO2 22 - 32 mmol/L 24 21(L) 19(L)  Calcium 8.9 - 10.3 mg/dL 8.5(L) 8.6(L) 8.3(L)    Scheduled meds: . Chlorhexidine Gluconate Cloth  6 each Topical Q0600  . digoxin  0.25 mg Intravenous Q6H  . insulin aspart  0-15 Units Subcutaneous Q4H  . levalbuterol  0.63 mg Nebulization TID  . mouth rinse  15 mL Mouth Rinse BID  . metoprolol tartrate  2.5 mg Intravenous Q6H  . mupirocin ointment  1 application Nasal BID  . mupirocin ointment   Topical Daily  .  sodium chloride flush  3 mL Intravenous Q12H     Diet Order:   Diet Order           Diet NPO time specified  Diet effective now          EDUCATION NEEDS:   Not appropriate for education at this time  Skin:  Skin Assessment: Reviewed RN Assessment(MASD and multiple abrasions)  Last BM:  unknown  Height:   Ht Readings from Last 1 Encounters:  12/17/17 _0  (1.778 m)    Weight:   Wt Readings from Last 1 Encounters:  12/21/17 166 lb 10.7 oz (75.6 kg)    Ideal Body Weight:  75 kg  BMI:  Body mass index is 23.91 kg/m.  Estimated  Nutritional Needs:   Kcal:  2156-2387 (28-31 kcal/kg/bw)  Protein:  92-108gr (1.2-1.4 gr/kg /bw)  Fluid:  2.2-2.4 liters daily   Colman Cater MS,RD,CSG,LDN Office: [phone] Pager: [phone]

## 2017-12-22 ENCOUNTER — Inpatient Hospital Stay (HOSPITAL_COMMUNITY): Payer: Medicare Other

## 2017-12-22 LAB — GLUCOSE, CAPILLARY
GLUCOSE-CAPILLARY: 231 mg/dL — AB (ref 65–99)
GLUCOSE-CAPILLARY: 295 mg/dL — AB (ref 65–99)
Glucose-Capillary: 209 mg/dL — ABNORMAL HIGH (ref 65–99)
Glucose-Capillary: 210 mg/dL — ABNORMAL HIGH (ref 65–99)
Glucose-Capillary: 225 mg/dL — ABNORMAL HIGH (ref 65–99)
Glucose-Capillary: 286 mg/dL — ABNORMAL HIGH (ref 65–99)

## 2017-12-22 LAB — RENAL FUNCTION PANEL
Albumin: 1.8 g/dL — ABNORMAL LOW (ref 3.5–5.0)
Anion gap: 6 (ref 5–15)
BUN: 65 mg/dL — AB (ref 6–20)
CHLORIDE: 128 mmol/L — AB (ref 101–111)
CO2: 22 mmol/L (ref 22–32)
CREATININE: 1.79 mg/dL — AB (ref 0.61–1.24)
Calcium: 7.9 mg/dL — ABNORMAL LOW (ref 8.9–10.3)
GFR calc Af Amer: 42 mL/min — ABNORMAL LOW (ref >60)
GFR calc non Af Amer: 36 mL/min — ABNORMAL LOW (ref >60)
GLUCOSE: 267 mg/dL — AB (ref 65–99)
POTASSIUM: 3.9 mmol/L (ref 3.5–5.1)
Phosphorus: 3.2 mg/dL (ref 2.5–4.6)
Sodium: 156 mmol/L — ABNORMAL HIGH (ref 135–145)

## 2017-12-22 LAB — COMPREHENSIVE METABOLIC PANEL
ALT: 82 U/L — AB (ref 17–63)
AST: 159 U/L — AB (ref 15–41)
Albumin: 1.9 g/dL — ABNORMAL LOW (ref 3.5–5.0)
Alkaline Phosphatase: 58 U/L (ref 38–126)
Anion gap: 5 (ref 5–15)
BUN: 62 mg/dL — AB (ref 6–20)
CHLORIDE: 134 mmol/L — AB (ref 101–111)
CO2: 22 mmol/L (ref 22–32)
CREATININE: 1.59 mg/dL — AB (ref 0.61–1.24)
Calcium: 8.1 mg/dL — ABNORMAL LOW (ref 8.9–10.3)
GFR calc Af Amer: 48 mL/min — ABNORMAL LOW (ref >60)
GFR, EST NON AFRICAN AMERICAN: 42 mL/min — AB (ref >60)
Glucose, Bld: 269 mg/dL — ABNORMAL HIGH (ref 65–99)
POTASSIUM: 4.3 mmol/L (ref 3.5–5.1)
SODIUM: 161 mmol/L — AB (ref 135–145)
Total Bilirubin: 1.4 mg/dL — ABNORMAL HIGH (ref 0.3–1.2)
Total Protein: 5.7 g/dL — ABNORMAL LOW (ref 6.5–8.1)

## 2017-12-22 LAB — OSMOLALITY: OSMOLALITY: 366 mosm/kg — AB (ref 275–295)

## 2017-12-22 LAB — CULTURE, BLOOD (ROUTINE X 2)
CULTURE: NO GROWTH
CULTURE: NO GROWTH
SPECIAL REQUESTS: ADEQUATE
Special Requests: ADEQUATE

## 2017-12-22 LAB — CBC
HCT: 31.9 % — ABNORMAL LOW (ref 39.0–52.0)
HEMOGLOBIN: 10.3 g/dL — AB (ref 13.0–17.0)
MCH: 32.6 pg (ref 26.0–34.0)
MCHC: 32.3 g/dL (ref 30.0–36.0)
MCV: 100.9 fL — ABNORMAL HIGH (ref 78.0–100.0)
PLATELETS: 29 10*3/uL — AB (ref 150–400)
RBC: 3.16 MIL/uL — AB (ref 4.22–5.81)
RDW: 14.9 % (ref 11.5–15.5)
WBC: 16.2 10*3/uL — AB (ref 4.0–10.5)

## 2017-12-22 LAB — BRAIN NATRIURETIC PEPTIDE: B Natriuretic Peptide: 212 pg/mL — ABNORMAL HIGH (ref 0.0–100.0)

## 2017-12-22 LAB — OSMOLALITY, URINE: OSMOLALITY UR: 579 mosm/kg (ref 300–900)

## 2017-12-22 MED ORDER — FUROSEMIDE 10 MG/ML IJ SOLN
40.0000 mg | Freq: Two times a day (BID) | INTRAMUSCULAR | Status: DC
  Administered 2017-12-22 – 2017-12-23 (×3): 40 mg via INTRAVENOUS
  Filled 2017-12-22 (×3): qty 4

## 2017-12-22 MED ORDER — IPRATROPIUM BROMIDE 0.02 % IN SOLN
0.5000 mg | Freq: Four times a day (QID) | RESPIRATORY_TRACT | Status: DC
  Administered 2017-12-22 – 2017-12-23 (×6): 0.5 mg via RESPIRATORY_TRACT
  Filled 2017-12-22 (×6): qty 2.5

## 2017-12-22 MED ORDER — LEVALBUTEROL HCL 1.25 MG/0.5ML IN NEBU
1.2500 mg | INHALATION_SOLUTION | Freq: Four times a day (QID) | RESPIRATORY_TRACT | Status: DC
  Administered 2017-12-22 – 2017-12-23 (×5): 1.25 mg via RESPIRATORY_TRACT
  Filled 2017-12-22 (×5): qty 0.5

## 2017-12-22 MED ORDER — DEXTROSE 5 % IV BOLUS: 1000.0000 mL | Freq: Once | INTRAVENOUS | Status: DC

## 2017-12-22 MED ORDER — DESMOPRESSIN ACE SPRAY REFRIG 0.01 % NA SOLN
1.0000 | Freq: Once | NASAL | Status: AC
  Administered 2017-12-22: 1 via NASAL
  Filled 2017-12-22: qty 5

## 2017-12-22 NOTE — Progress Notes (Signed)
Lab called critical value serum osmolality 366. Linton Flemings notified

## 2017-12-22 NOTE — Progress Notes (Signed)
NTSX large amount of thick secretions. Patient does struggle against suctioning. Most rhonchi is upper airway as patient does not cough without stimulation.

## 2017-12-22 NOTE — Progress Notes (Signed)
CRITICAL VALUE ALERT  Critical Value:  Sodium 161   Chloride 134  Date & Time Notied:  12/22/2017 0440  Provider Notified: Robb Matar @ [phone]  Orders Received/Actions taken: no new orders at this time

## 2017-12-22 NOTE — Progress Notes (Addendum)
Night shift ICU coverage note.  The patient was seen due to fever of 100 F and respiratory distress.  Respirations were in the mid 20s and low 30s when initially seen.  Rest of the vital signs pulse 107, blood pressure 118/64 mmHg and O2 sat 98% on nasal cannula oxygen.  He is currently on ceftriaxone for Klebsiella UTI and clindamycin for aspiration pneumonia.  He is unable to provide history at this time.  He was nonverbal, but responded to tactile stimuli. HEENT normocephalic, pupils are equal, oral mucosa is mildly dry. Neck supple, no JVD. Lungs decreased breath sounds with rhonchi bilaterally and subtle wheeze. Abdomen soft, nontender. Extremities: No clubbing or cyanosis, no edema. Neuro: Right-sided hemiplegia.  Brain natriuretic peptide [[id number]] (Abnormal) Collected: 12/22/17 0342  Updated: 12/22/17 0443   Specimen Type: Blood    B Natriuretic Peptide 212.0High  pg/mL  Comprehensive metabolic panel [[id number]] (Abnormal) Collected: 12/22/17 0342  Updated: 12/22/17 0441   Specimen Type: Blood    Sodium 161High Panic   mmol/L   Potassium 4.3 mmol/L   Chloride 134High Panic   mmol/L   CO2 22 mmol/L   Glucose, Bld 269High  mg/dL   BUN 62EZMO  mg/dL   Creatinine, Ser 1.59High  mg/dL   Calcium 2.9UTM  mg/dL   Total Protein 5.4YTK  g/dL   Albumin 3.5WSF  g/dL   AST 681EXNT  U/L   ALT 82High  U/L   Alkaline Phosphatase 58 U/L   Total Bilirubin 1.4High  mg/dL   GFR calc non Af Amer 42Low  mL/min   GFR calc Af Amer 48Low  mL/min   Anion gap 5  Glucose, capillary [[id number]] (Abnormal) Collected: 12/22/17 0417                  CBC [[id number]] (Abnormal) Collected: 12/22/17 0342  Updated: 12/22/17 0427   Specimen Type: Blood    WBC 16.2High  K/uL   RBC 3.16Low  MIL/uL   Hemoglobin 10.3Low  g/dL   HCT 46.6ZLD  %   MCV 100.9High  fL   MCH 32.6 pg   MCHC 32.3 g/dL   RDW 35.7 %   Platelets 29Low Panic   K/uL    Fever WBC increasing from 14 to 16k  today. Hypernatremia. Sodium is increasing despite getting D5 water at 150 mL/h for almost 24 hours. Embolic stroke Mental status seems to be decreasing. Consider repeat CT. Continue current therapy for now.  However, his overall condition is also worsening. 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 Consider palliative care evaluation and discussion with the family.  Sanda Klein, MD [phone].  About 45 minutes of critical care time were spent during the process of this origin event.  This document was prepared using Dragon voice recognition software and may contain some unintended transcription errors.

## 2017-12-22 NOTE — Progress Notes (Signed)
Subjective: Interval History: none.  Objective: Vital signs in last 24 hours: Temp:  [97.7 F (36.5 C)-100.1 F (37.8 C)] 100.1 F (37.8 C) (05/11 0741) Pulse Rate:  [96-177] 107 (05/11 0400) Resp:  [11-22] 20 (05/11 0741) BP: (98-141)/(56-93) 118/56 (05/11 0600) SpO2:  [46 %-100 %] 99 % (05/11 0740) Weight:  [76.4 kg (168 lb 6.9 oz)] 76.4 kg (168 lb 6.9 oz) (05/11 0400) Weight change: 0.8 kg (1 lb 12.2 oz)  Intake/Output from previous day: 05/10 0701 - 05/11 0700 In: 3941.8 [I.V.:3025; NG/GT:716.8; IV Piggyback:200] Out: -  Intake/Output this shift: No intake/output data recorded.  Generally patient is arousable and does not complicate. Chest: Has bilateral inspiratory crackles Heart exam regular rate and rhythm Abdomen: Soft positive bowel sounds Extremities: He has 1+ edema   Lab Results: Recent Labs    12/21/17 0414 12/22/17 0342  WBC 26.1* 16.2*  HGB 10.6* 10.3*  HCT 32.0* 31.9*  PLT 26* 29*   BMET:  Recent Labs    12/21/17 0414 12/22/17 0342  NA 163* 161*  K 3.8 4.3  CL >130* 134*  CO2 24 22  GLUCOSE 196* 269*  BUN 67* 62*  CREATININE 1.55* 1.59*  CALCIUM 8.5* 8.1*   No results for input(s): PTH in the last 72 hours. Iron Studies: No results for input(s): IRON, TIBC, TRANSFERRIN, FERRITIN in the last 72 hours.  Studies/Results: Dg Chest Port 1 View  Result Date: 12/22/2017 CLINICAL DATA:  72 year old male with fever.  Follow-up radiograph. EXAM: PORTABLE CHEST 1 VIEW COMPARISON:  Chest radiograph dated 12/20/2017 FINDINGS: Enteric tube extending into the distal stomach with tip beyond the inferior margin of the image. There is shallow inspiration. No focal consolidation, pleural effusion, or pneumothorax. The cardiac silhouette is within normal limits. No acute osseous pathology. IMPRESSION: 1. No acute cardiopulmonary process. 2. Enteric tube in similar position with tip beyond the inferior margin of the image. Electronically Signed   By: Elgie Collard M.D.   On: 12/22/2017 04:07   Dg Chest Port 1v Same Day  Result Date: 12/20/2017 CLINICAL DATA:  NG tube placement. EXAM: PORTABLE CHEST 1 VIEW COMPARISON:  12/20/2017. FINDINGS: Normal heart size. Clear lung fields. No bony abnormality. Nasogastric tube has passed through the stomach, with its tip in the second duodenum. IMPRESSION: Enteric placement of nasogastric tube as described. No active cardiopulmonary disease. Electronically Signed   By: Elsie Stain M.D.   On: 12/20/2017 19:24    I have reviewed the patient's current medications.  Assessment/Plan: 1] hyponatremia: Serum osmolality is 366 mOsm /kg very high.  Urine osmolality is 651 mOsm /kg.  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  His serum sodium is 163 and improving. 2] renal failure: Acute : Presently his creatinine is stable. 3] history of nonischemic cardiomyopathy:  4] History of sepsis: Aspiration pneumonia/UTI 5] history of ACA and left RCA infarct 6] history of seizure disorder Plan: 1]We will decrease IV fluid to 100 cc/h 2] At this moment because of his high serum osmolality we will hold water deprivation test 3] we will give patient DDAVP 10 mcg nasally 4] we will check urine osmolality and serum osmolality 1 hour after DDAVP is given 5] we will give Lasix 40 mg IV twice daily after the test after urine osmolaity is collected 6] we will check his renal panel in the morning.   LOS: 5 days   Jered Heiny S 12/22/2017,8:31 AM

## 2017-12-22 NOTE — Progress Notes (Signed)
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 Neighbors had not seen him since5/2/19and the police was present at his apartment for another reason and the apartment staff decided to check on the patient. They found him down on the floor unresponsive covered in urine. Brought into the emergency department where he was noted to be in atrial fibrillation with rapid ventricular response. He was given multiple medications including diltiazem and Lopressor to improve his heart rate. Ultimately was started on a diltiazem drip. After that his blood pressure dropped when he spiked a temperature to 1.9 in the emergency department. Blood pressure had been in the 180s systolic and then dropped to the 78/47 range. Received 4 L of IV fluid and his blood pressure has improved to the mid to low 100s currently 107/62. The patient was found to have markedly infected urine, and elevated troponin, elevated blood glucoses, and elevated bilirubin, and evaded CPK.The patient was given vancomycin and cefepime in the emergency department. Chest x-ray showed mild lingular and bilateral lower lobe opacities.He was given adenosine 6 mg and subsequently 12 mg. He was ultimately started on diltiazem drip. Cardiology was consulted for elevated troponins. They felt this to be due to demand ischemia.    Assessment/Plan: Sepsis -Secondary to UTI and aspiration pneumonia -Lactic acid peaked at 6.1 -Procalcitonin to46.76 -continue merrem>>ceftriaxone/clinda -WBC increased--suspect may be due to steroids>>>now improving  Acute metabolic encephalopathy -Multifactorial including sepsis, stroke, acute on chronic  renal failure, hypernatremia -Patientmore awake, but aphasic from stroke -EEG--moderate diffuse slowing  Cheyne-Stokes Respirations -secondary to large ACA and MCA stroke -no oxygen desaturation -monitor clinically  UTI--Klebsiella -ContinueMeropenem>>>ceftriaxone -de-escalate to ceftriaxone  Aspiration pneumonia -Continuemeropenem>>>changed to ceftriaxone and clinda -personally reviewed CXR--bilateral lower lobe opacities  Acute embolic stroke -03/14/1913 MRI brain--large acute left ACA territory infarct and a left MCA infarct;multiple foci of acute ischemia in the left basal ganglia, left thalamus, right occipital lobe, right temporal lobe, right midbrain, and bilateral cerebellum -Neurology consult appreciated -12/18/2017 echo EF 40-45%, grade 1 DD, mild MR  Atrial fibrillation with RVR -Patient has converted to sinus rhythm, now back to afib with RVR -Continue IV metoprolol--increase to 2.5 mg q 6 hrs -Currently a poor candidate for anticoagulation, but defer to cardiology and neurology -digoxin added  Hypernatremia/Diabetes insipidus -concerned about central diabetes insipidus -serum osm 651 -urine osm 366 -nephrology consulted -continue D5W--increased rate -desmopressin per nephrolgy  Rhabdomyolysis, traumatic -CPK peaked at898>>145  Thrombocytopenia -Secondary to sepsis -Holding Lovenox and heparin -Serum B12--2095 -TSH 1.211 -PTT 29 -Check INR--1.25  Acute on chronic renal failure--CKD stage III -Secondary to volume depletion, sepsis, and rhabdomyolysis -Baseline creatinine 1.2-1.4 -Serum creatinine peaked to 2.58  Elevated troponin -Appreciate cardiology consult -Felt to be due to demand ischemia  Neck contusion -MRI cervical spine--negative for fracture. Cervical prevertebral edema more in the superficial soft tissues of the left lateral neck favoring posttraumatic etiology;no soft tissue edema suggestive of ligamentous  injury  Impaired Glucose tolerance -HbA1C--5.9 -allow for liberal glycemic control at this point  Goals of Care -appreciate palliative care consult -overall poor prognosis with low likelihood of recover to pre-morbid condition -12/20/17--discussed with family-->change to DNR -12/22/17--waiting for rest of family from out of state to arrive before  transitioning to full comfort -Advance care planning, including the explanation and discussion of advance directives was carried out with the patient and family. Code status including explanations of "Full Code" and "DNR" and alternatives were discussed in detail. Discussion of end-of-life issues including but not limited palliative care, hospice care and the concept of hospice, other end-of-life care options, power of attorney for health care decisions, living wills, and physician orders for life-sustaining treatment were also discussed with the patient and family. Total face to face time .   The patient is critically ill with multiple organ systems failure and requires high complexity decision making for assessment and support, frequent evaluation and titration of therapies, application of advanced monitoring technologies and extensive interpretation of multiple databases.  Critical care time -additional 44 mins.to the 45 min spent by Dr. Robb Matar on 12/22/17     Disposition Plan:Residential hospice, once out of state family visits with pt Family Communication:Daughter updatedat bedside 5/11  Consultants:Palliative medicine, cardiology, nephrology  Code Status: FULL   DVT Prophylaxis: SCDs   Procedures: As Listed in Progress Note Above  Antibiotics: vanco 5/6 Cefepime 5/6 azithro 5/6 Unasyn 5/7>>>5/8 merrem 5/8>>>5/10 ceftriaxone 5/10>>> clinda 5/10>>>     Subjective: Pt occasionally opens eyes.  Does not follow commands.  ROS unobtainable due to mental status.  No reports of vomiting,  diarrhea, respiratory distress.  Objective: Vitals:   12/22/17 1300 12/22/17 1400 12/22/17 1500 12/22/17 1616  BP: 107/64 (!) 108/53 (!) 103/55   Pulse: (!) 101 (!) 110 (!) 106 (!) 107  Resp: (!) 23 (!) 21 20 18   Temp:    100.2 F (37.9 C)  TempSrc:    Axillary  SpO2: 97% 99% 98% 98%  Weight:      Height:        Intake/Output Summary (Last 24 hours) at 12/22/2017 1631 Last data filed at 12/22/2017 1000 Gross per 24 hour  Intake 4342.66 ml  Output 300 ml  Net 4042.66 ml   Weight change: 0.8 kg (1 lb 12.2 oz) Exam:   General:  Pt is alert, follows commands appropriately, not in acute distress  HEENT: No icterus, No thrush, No neck mass, [place]/AT  Cardiovascular: RRR, S1/S2, no rubs, no gallops  Respiratory: CTA bilaterally, no wheezing, no crackles, no rhonchi  Abdomen: Soft/+BS, non tender, non distended, no guarding  Extremities: No edema, No lymphangitis, No petechiae, No rashes, no synovitis   Data Reviewed: I have personally reviewed following labs and imaging studies Basic Metabolic Panel: Recent Labs  Lab 12/17/17 1146  12/19/17 0421 12/20/17 0439 12/21/17 0414 12/22/17 0342 12/22/17 1428  NA  --    < > 152* 158* 163* 161* 156*  K  --    < > 4.4 3.9 3.8 4.3 3.9  CL  --    < > 121* 128* >130* 134* 128*  CO2  --    < > 19* 21* 24 22 22   GLUCOSE  --    < > 249* 309* 196* 269* 267*  BUN  --    < > 92* 86* 67* 62* 65*  CREATININE  --    < > 2.15* 1.96* 1.55* 1.59* 1.79*  CALCIUM  --    < > 8.3* 8.6* 8.5* 8.1* 7.9*  MG 2.8*  --   --   --  3.0*  --   --   PHOS  --   --   --   --   --   --  3.2   < > =  values in this interval not displayed.   Liver Function Tests: Recent Labs  Lab 12/17/17 1105 12/18/17 0424 12/20/17 0439 12/22/17 0342 12/22/17 1428  AST 74* 71* 31 159*  --   ALT 30 23 22  82*  --   ALKPHOS 45 35* 38 58  --   BILITOT 3.5* 3.8* 1.3* 1.4*  --   PROT 8.4* 6.6 6.6 5.7*  --   ALBUMIN 3.0* 2.2* 2.2* 1.9* 1.8*   No results for input(s):  LIPASE, AMYLASE in the last 168 hours. No results for input(s): AMMONIA in the last 168 hours. Coagulation Profile: Recent Labs  Lab 12/20/17 0439  INR 1.25   CBC: Recent Labs  Lab 12/18/17 0424 12/19/17 0421 12/20/17 0439 12/21/17 0414 12/22/17 0342  WBC 26.6* 28.2* 34.4* 26.1* 16.2*  NEUTROABS  --   --   --  24.0*  --   HGB 11.0* 10.8* 10.9* 10.6* 10.3*  HCT 32.8* 33.3* 32.9* 32.0* 31.9*  MCV 97.3 97.4 97.1 99.7 100.9*  PLT 38* 42* 30* 26* 29*   Cardiac Enzymes: Recent Labs  Lab 12/17/17 1105 12/17/17 1759 12/18/17 0047 12/18/17 0424 12/20/17 0439  CKTOTAL 898*  --   --   --  145  TROPONINI 0.90* 0.88* 0.65* 0.54*  --    BNP: Invalid input(s): POCBNP CBG: Recent Labs  Lab 12/22/17 0006 12/22/17 0417 12/22/17 0737 12/22/17 1147 12/22/17 1616  GLUCAP 210* 231* 295* 286* 209*   HbA1C: Recent Labs    12/20/17 0439  HGBA1C 5.9*   Urine analysis:    Component Value Date/Time   COLORURINE AMBER (A) 12/17/2017 1105   APPEARANCEUR CLOUDY (A) 12/17/2017 1105   LABSPEC 1.015 12/17/2017 1105   PHURINE 5.0 12/17/2017 1105   GLUCOSEU NEGATIVE 12/17/2017 1105   HGBUR LARGE (A) 12/17/2017 1105   BILIRUBINUR NEGATIVE 12/17/2017 1105   KETONESUR 5 (A) 12/17/2017 1105   PROTEINUR 30 (A) 12/17/2017 1105   NITRITE NEGATIVE 12/17/2017 1105   LEUKOCYTESUR MODERATE (A) 12/17/2017 1105   Sepsis Labs: @LABRCNTIP (procalcitonin:4,lacticidven:4) ) Recent Results (from the past 240 hour(s))  Urine culture     Status: Abnormal   Collection Time: 12/17/17 11:05 AM  Result Value Ref Range Status   Specimen Description   Final    URINE, RANDOM Performed at [facility], [address] [place], [place] [address]    Special Requests   Final    NONE Performed at [facility], [address], [place], [place] [address]    Culture >=100,000 COLONIES/mL KLEBSIELLA OXYTOCA (A)  Final   Report Status 12/20/2017 FINAL  Final   Organism ID, Bacteria KLEBSIELLA OXYTOCA (A)   Final      Susceptibility   Klebsiella oxytoca - MIC*    AMPICILLIN >=32 RESISTANT Resistant     CEFAZOLIN >=64 RESISTANT Resistant     CEFTRIAXONE <=1 SENSITIVE Sensitive     CIPROFLOXACIN <=0.25 SENSITIVE Sensitive     GENTAMICIN <=1 SENSITIVE Sensitive     IMIPENEM 0.5 SENSITIVE Sensitive     NITROFURANTOIN 64 INTERMEDIATE Intermediate     TRIMETH/SULFA <=20 SENSITIVE Sensitive     AMPICILLIN/SULBACTAM >=32 RESISTANT Resistant     PIP/TAZO >=128 RESISTANT Resistant     Extended ESBL NEGATIVE Sensitive     * >=100,000 COLONIES/mL KLEBSIELLA OXYTOCA  Blood culture (routine x 2)     Status: None   Collection Time: 12/17/17  7:21 PM  Result Value Ref Range Status   Specimen Description BLOOD RIGHT ARM DRAWN BY RN  Final  Special Requests   Final    BOTTLES DRAWN AEROBIC AND ANAEROBIC Blood Culture adequate volume   Culture   Final    NO GROWTH 5 DAYS Performed at [facility], [address], [place], [place] [address]    Report Status 12/22/2017 FINAL  Final  Blood culture (routine x 2)     Status: None   Collection Time: 12/17/17  7:21 PM  Result Value Ref Range Status   Specimen Description BLOOD RIGHT WRIST DRAWN BY RN  Final   Special Requests   Final    BOTTLES DRAWN AEROBIC AND ANAEROBIC Blood Culture adequate volume   Culture   Final    NO GROWTH 5 DAYS Performed at [facility], [address] [place], [place] [address]    Report Status 12/22/2017 FINAL  Final  MRSA PCR Screening     Status: Abnormal   Collection Time: 12/17/17  8:08 PM  Result Value Ref Range Status   MRSA by PCR POSITIVE (A) NEGATIVE Final    Comment:        The GeneXpert MRSA Assay (FDA approved for NASAL specimens only), is one component of a comprehensive MRSA colonization surveillance program. It is not intended to diagnose MRSA infection nor to guide or monitor treatment for MRSA infections. RESULT CALLED TO, READ BACK BY AND VERIFIED WITH: AMBURN,A. AT 0981 ON 12/18/2017 BY  EVA Performed at [facility], [address], [place], [place] [address]      Scheduled Meds: . furosemide  40 mg Intravenous BID  . insulin aspart  0-15 Units Subcutaneous Q4H  . ipratropium  0.5 mg Nebulization Q6H  . levalbuterol  1.25 mg Nebulization Q6H  . mouth rinse  15 mL Mouth Rinse BID  . metoprolol tartrate  2.5 mg Intravenous Q6H  . mupirocin ointment   Topical Daily  . sodium chloride flush  3 mL Intravenous Q12H   Continuous Infusions: . cefTRIAXone (ROCEPHIN)  IV    . clindamycin (CLEOCIN) IV Stopped (12/22/17 1438)  . dextrose 100 mL/hr at 12/22/17 1200  . famotidine (PEPCID) IV Stopped (12/21/17 2337)  . feeding supplement (VITAL AF 1.2 CAL) 1,000 mL (12/22/17 1050)    Procedures/Studies: Ct Head Wo Contrast  Result Date: 12/17/2017 CLINICAL DATA:  Found down in his apartment. Laceration to left cheek. EXAM: CT HEAD WITHOUT CONTRAST CT MAXILLOFACIAL WITHOUT CONTRAST CT CERVICAL SPINE WITHOUT CONTRAST TECHNIQUE: Multidetector CT imaging of the head, cervical spine, and maxillofacial structures were performed using the standard protocol without intravenous contrast. Multiplanar CT image reconstructions of the cervical spine and maxillofacial structures were also generated. COMPARISON:  Cervical spine x-rays dated August 03, 2008. 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 No evidence of hemorrhage, hydrocephalus, extra-axial collection or mass lesion/mass effect. Mild to moderate generalized cerebral atrophy. Mild scattered periventricular and subcortical white matter hypodensities are nonspecific, but favored to reflect chronic microvascular ischemic changes. Vascular: Atherosclerotic vascular calcification of the carotid siphons. No hyperdense vessel. Skull: Normal. Negative for fracture or focal lesion. Other: Small left frontal scalp hematoma. CT MAXILLOFACIAL FINDINGS Osseous: No fracture or mandibular  dislocation. No destructive process. Periapical lucencies involving the right maxillary first and second molars. Orbits: Negative. No traumatic or inflammatory finding. Sinuses: Chronic appearing mild-to-moderate moderate mucosal thickening of the paranasal sinuses, moderate in the left maxillary sinus. The mastoid air cells are clear. Soft tissues: Left cheek laceration. CT CERVICAL SPINE FINDINGS Alignment: Reversal of the normal cervical lordosis, centered at  C5, unchanged. No traumatic malalignment. Skull base and vertebrae: No acute cervical spine fracture. Mild anterior superior endplate height loss at T1 with associated Schmorl's node. No primary bone lesion or focal pathologic process. Soft tissues and spinal canal: Mild prevertebral soft tissue thickening. Subcutaneous stranding and fluid in the left neck. No visible canal hematoma. Disc levels: Moderate to severe disc height loss at C2-C3, C5-C6, and C6-C7, progressed when compared to prior study. Mild bilateral neuroforaminal stenosis at C5-C6 and C6-C7. Upper chest: Negative. Other: 2.9 cm superficial subcutaneous cystic lesion in the right posterior neck, likely a sebaceous cyst. IMPRESSION: 1. 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 2. No acute cervical spine fracture. Mild prevertebral soft tissue thickening and subcutaneous stranding/fluid in the left neck. Ligamentous and/or soft tissue injury is not excluded. Recommend MRI cervical spine for further evaluation. 3. Mild anterior superior endplate height loss at T1 with associated Schmorl's node is favored chronic. 4.  No acute facial fracture. 5. Small left frontal scalp hematoma.  Left cheek laceration. Electronically Signed   By: Obie Dredge M.D.   On: 12/17/2017 12:06   Ct Cervical Spine Wo Contrast  Result Date: 12/17/2017 CLINICAL DATA:  Found down in his apartment. Laceration to left cheek. EXAM: CT HEAD WITHOUT CONTRAST CT MAXILLOFACIAL WITHOUT  CONTRAST CT CERVICAL SPINE WITHOUT CONTRAST TECHNIQUE: Multidetector CT imaging of the head, cervical spine, and maxillofacial structures were performed using the standard protocol without intravenous contrast. Multiplanar CT image reconstructions of the cervical spine and maxillofacial structures were also generated. COMPARISON:  Cervical spine x-rays dated August 03, 2008. 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 No evidence of hemorrhage, hydrocephalus, extra-axial collection or mass lesion/mass effect. Mild to moderate generalized cerebral atrophy. Mild scattered periventricular and subcortical white matter hypodensities are nonspecific, but favored to reflect chronic microvascular ischemic changes. Vascular: Atherosclerotic vascular calcification of the carotid siphons. No hyperdense vessel. Skull: Normal. Negative for fracture or focal lesion. Other: Small left frontal scalp hematoma. CT MAXILLOFACIAL FINDINGS Osseous: No fracture or mandibular dislocation. No destructive process. Periapical lucencies involving the right maxillary first and second molars. Orbits: Negative. No traumatic or inflammatory finding. Sinuses: Chronic appearing mild-to-moderate moderate mucosal thickening of the paranasal sinuses, moderate in the left maxillary sinus. The mastoid air cells are clear. Soft tissues: Left cheek laceration. CT CERVICAL SPINE FINDINGS Alignment: Reversal of the normal cervical lordosis, centered at C5, unchanged. No traumatic malalignment. Skull base and vertebrae: No acute cervical spine fracture. Mild anterior superior endplate height loss at T1 with associated Schmorl's node. No primary bone lesion or focal pathologic process. Soft tissues and spinal canal: Mild prevertebral soft tissue thickening. Subcutaneous stranding and fluid in the left neck. No visible canal hematoma. Disc levels: Moderate to severe disc height loss at  C2-C3, C5-C6, and C6-C7, progressed when compared to prior study. Mild bilateral neuroforaminal stenosis at C5-C6 and C6-C7. Upper chest: Negative. Other: 2.9 cm superficial subcutaneous cystic lesion in the right posterior neck, likely a sebaceous cyst. IMPRESSION: 1. 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 2. No acute cervical spine fracture. Mild prevertebral soft tissue thickening and subcutaneous stranding/fluid in the left neck. Ligamentous and/or soft tissue injury is not excluded. Recommend MRI cervical spine for further evaluation. 3. Mild anterior superior endplate height loss at T1 with associated Schmorl's node is favored chronic. 4.  No acute facial fracture. 5. Small left frontal scalp hematoma.  Left cheek laceration.  Electronically Signed   By: Obie Dredge M.D.   On: 12/17/2017 12:06   Mr Maxine Glenn Head Wo Contrast  Result Date: 12/18/2017 CLINICAL DATA:  Altered mental status EXAM: MRI HEAD WITHOUT CONTRAST MRA HEAD WITHOUT CONTRAST TECHNIQUE: Multiplanar, multiecho pulse sequences of the brain and surrounding structures were obtained without intravenous contrast. Angiographic images of the head were obtained using MRA technique without contrast. COMPARISON:  None. FINDINGS: MRI HEAD FINDINGS Brain: Multifocal abnormal diffusion restriction. The largest areas involve the left anterior and middle cerebral artery territories. There are smaller infarct scattered within the left deep gray structures, right occipital lobe, right temporal lobe, right midbrain and both cerebellar hemispheres. Extensive hyperintense T2-weighted signal corresponds to the areas of ischemia. There is no midline shift. No acute hemorrhage. No mass lesion. No chronic microhemorrhage or cerebral amyloid angiopathy. No hydrocephalus, age advanced atrophy or lobar predominant volume loss. No dural abnormality or extra-axial collection. Skull and upper cervical spine: The visualized skull base,  calvarium, upper cervical spine and extracranial soft tissues are normal. Sinuses/Orbits: Moderate left maxillary sinus mucosal disease. Small amount of bilateral mastoid fluid. Normal orbits. MRA HEAD FINDINGS Intracranial internal carotid arteries: Normal. Anterior cerebral arteries: The left anterior cerebral arteries occluded of the proximal A2 segment. Normal right ACA. Middle cerebral arteries: There is occlusion of the left middle cerebral artery inferior division. Superior division and M1 segment are normal. Right MCA is normal. Posterior communicating arteries: Present bilaterally. Posterior cerebral arteries: Bilateral fetal origins.  Normal. Basilar artery: Normal. Vertebral arteries: Left dominant. Distal right vertebral artery appears occluded or severely stenotic. Superior cerebellar arteries: Normal. Anterior inferior cerebellar arteries: Normal. Posterior inferior cerebellar arteries: Nonvisualization of right PICA. Normal left. IMPRESSION: 1. 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 No acute hemorrhage or herniation. 2. Multiple small foci of acute ischemia within the left basal ganglia, left thalamus, right occipital lobe, right temporal lobe, right midbrain and bilaterally within the cerebellum. 3. 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 4. Occlusion of the proximal to midportion of the left anterior cerebral artery A2 segment. Electronically Signed   By: Deatra Robinson M.D.   On: 12/18/2017 17:49   Mr Brain Wo Contrast  Result Date: 12/18/2017 CLINICAL DATA:  Altered mental status EXAM: MRI HEAD WITHOUT CONTRAST MRA HEAD WITHOUT CONTRAST TECHNIQUE: Multiplanar, multiecho pulse sequences of the brain and surrounding structures were obtained without intravenous contrast. Angiographic images of the head were obtained using MRA technique without contrast. COMPARISON:  None. FINDINGS: MRI  HEAD FINDINGS Brain: Multifocal abnormal diffusion restriction. The largest areas involve the left anterior and middle cerebral artery territories. There are smaller infarct scattered within the left deep gray structures, right occipital lobe, right temporal lobe, right midbrain and both cerebellar hemispheres. Extensive hyperintense T2-weighted signal corresponds to the areas of ischemia. There is no midline shift. No acute hemorrhage. No mass lesion. No chronic microhemorrhage or cerebral amyloid angiopathy. No hydrocephalus, age advanced atrophy or lobar predominant volume loss. No dural abnormality or extra-axial collection. Skull and upper cervical spine: The visualized skull base, calvarium, upper cervical spine and extracranial soft tissues are normal. Sinuses/Orbits: Moderate left maxillary sinus mucosal disease. Small amount of bilateral mastoid fluid. Normal orbits. MRA HEAD FINDINGS Intracranial internal carotid arteries: Normal. Anterior cerebral arteries: The left anterior cerebral arteries occluded of the proximal A2 segment. Normal right ACA. Middle cerebral arteries: There is occlusion of the left middle cerebral artery  inferior division. Superior division and M1 segment are normal. Right MCA is normal. Posterior communicating arteries: Present bilaterally. Posterior cerebral arteries: Bilateral fetal origins.  Normal. Basilar artery: Normal. Vertebral arteries: Left dominant. Distal right vertebral artery appears occluded or severely stenotic. Superior cerebellar arteries: Normal. Anterior inferior cerebellar arteries: Normal. Posterior inferior cerebellar arteries: Nonvisualization of right PICA. Normal left. IMPRESSION: 1. 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 No acute hemorrhage or herniation. 2. Multiple small foci of acute ischemia within the left basal ganglia, left thalamus, right occipital lobe, right temporal  lobe, right midbrain and bilaterally within the cerebellum. 3. 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 4. Occlusion of the proximal to midportion of the left anterior cerebral artery A2 segment. Electronically Signed   By: Deatra Robinson M.D.   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 Electronically Signed   By: Sebastian Ache M.D.   On: 12/18/2017 12:32   Result Date: 12/18/2017 CLINICAL DATA:  Found down on floor unresponsive. Prevertebral swelling on CT. EXAM: MRI CERVICAL SPINE WITHOUT CONTRAST TECHNIQUE: Multiplanar, multisequence MR imaging of the cervical spine was performed. No intravenous contrast was administered. COMPARISON:  Cervical spine CT 12/17/2017 FINDINGS: Alignment: Mild reversal the normal cervical lordosis. Trace retrolisthesis of C2 on C3, C5 on C6, and C6 on C7. Vertebrae: T1 superior endplate Schmorl's node without marrow edema. Advanced disc degeneration at C5-6 and C6-7 with T2/stir hyperintensity diffusely throughout both disc spaces and mild endplate edema. Cord: Normal signal. Posterior Fossa, vertebral arteries, paraspinal tissues: Prevertebral fluid extends from C2-C7 and measures up to 1 cm in AP thickness at the C3-4 level. Edema in the more superficial soft tissues of the left lateral neck was more fully imaged on the earlier CT and may favor a posttraumatic etiology for the prevertebral fluid. No discrete anterior longitudinal ligament disruption is identified. There is no soft tissue edema suggestive of posterior ligamentous complex injury. 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 Preserved vertebral artery flow voids in the neck. Abnormal appearance of the right V4 segment. 2.9  cm T2 hyperintense subcutaneous lesion in the posterior right lower neck, likely a sebaceous cyst. Disc levels: C2-3: Severe disc space narrowing. Disc bulging, uncovertebral spurring, infolding of the ligamentum flavum, and moderate right facet arthrosis result in moderate spinal stenosis and mild-to-moderate right and severe left neural foraminal stenosis. C3-4: Small to moderate-sized central disc extrusion with mild superior migration and infolding of the ligamentum flavum result in moderate spinal stenosis. No significant neural foraminal stenosis. C4-5: Broad posterior disc protrusion results in mild spinal stenosis. Mild bilateral neural foraminal stenosis. C5-6: Severe disc space narrowing. Broad-based posterior disc osteophyte complex results in mild-to-moderate spinal stenosis and severe bilateral neural foraminal stenosis. C6-7: Severe disc space narrowing. Broad-based posterior disc osteophyte complex results in mild-to-moderate spinal stenosis and severe bilateral neural foraminal stenosis. C7-T1: Minimal disc bulging and mild facet arthrosis without stenosis. IMPRESSION: 1. Diffuse cervical prevertebral fluid/edema. This could reflect posttraumatic effusion and soft tissue injury versus early infectious discitis at C5-6 and C6-7. 2. 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 Electronically Signed: By: Jolaine Click.D.  On: 12/18/2017 11:44   Dg Chest Port 1 View  Result Date: 12/22/2017 CLINICAL DATA:  72 year old male with fever.  Follow-up radiograph. EXAM: PORTABLE CHEST 1 VIEW COMPARISON:  Chest radiograph dated 12/20/2017 FINDINGS: Enteric tube extending into the distal stomach with tip beyond the inferior margin of the image. There is shallow inspiration. No focal consolidation, pleural effusion, or pneumothorax. The cardiac silhouette is within normal limits. No acute osseous pathology. IMPRESSION: 1. No  acute cardiopulmonary process. 2. Enteric tube in similar position with tip beyond the inferior margin of the image. Electronically Signed   By: Elgie Collard M.D.   On: 12/22/2017 04:07   Dg Chest Port 1 View  Result Date: 12/20/2017 CLINICAL DATA:  Aspiration pneumonia, found unresponsive 3 days ago EXAM: PORTABLE CHEST 1 VIEW COMPARISON:  12/18/2017 FINDINGS: Persistent low lung volumes with minor basilar atelectasis. No definite pneumonia, collapse or consolidation. Negative for edema, effusion or pneumothorax. Exam is rotated to the left. Aorta is atherosclerotic. No acute osseous finding. IMPRESSION: Persistent low lung volumes with basilar atelectasis. Electronically Signed   By: Judie Petit.  Shick M.D.   On: 12/20/2017 10:20   Portable Chest X-ray 1 View  Result Date: 12/18/2017 CLINICAL DATA:  Sepsis, AFib EXAM: PORTABLE CHEST 1 VIEW COMPARISON:  12/17/2017 FINDINGS: Mild lingular and bilateral lower lobe opacities, likely atelectasis, although pneumonia remains possible. No frank interstitial edema.  No pleural effusion or pneumothorax. The heart is normal in size. IMPRESSION: Mild lingular and bilateral lower lobe opacities, likely atelectasis, although pneumonia remains possible. Electronically Signed   By: Charline Bills M.D.   On: 12/18/2017 07:35   Dg Chest Port 1 View  Result Date: 12/17/2017 CLINICAL DATA:  Chest pain, altered mental status EXAM: PORTABLE CHEST 1 VIEW COMPARISON:  Chest x-ray of 05/18/2017 FINDINGS: No active infiltrate or effusion is seen. Mediastinal and hilar contours are unremarkable. The heart is borderline enlarged and stable. No bony abnormality is seen. IMPRESSION: No active lung disease.  Heart upper normal in size. Electronically Signed   By: Dwyane Dee M.D.   On: 12/17/2017 11:56   Dg Chest Port 1v Same Day  Result Date: 12/20/2017 CLINICAL DATA:  NG tube placement. EXAM: PORTABLE CHEST 1 VIEW COMPARISON:  12/20/2017. FINDINGS: Normal heart size. Clear lung  fields. No bony abnormality. Nasogastric tube has passed through the stomach, with its tip in the second duodenum. IMPRESSION: Enteric placement of nasogastric tube as described. No active cardiopulmonary disease. Electronically Signed   By: Elsie Stain M.D.   On: 12/20/2017 19:24   Dg Chest Port 1v Same Day  Result Date: 12/17/2017 CLINICAL DATA:  72 year old male with code sepsis, and evolving lung sounds EXAM: PORTABLE CHEST 1 VIEW COMPARISON:  Chest x-ray obtained earlier today at 11:40 a.m. FINDINGS: Significantly lower inspiratory volumes with developing lingular and left lower lobe streaky airspace opacities favored to reflect atelectasis. No overt pulmonary edema, pneumothorax or pleural effusion. Stable cardiac and mediastinal contours. No acute osseous abnormality. IMPRESSION: 1. Lower inspiratory volumes with developing streaky lingular and left lower lobe airspace opacities favored to reflect atelectasis. Electronically Signed   By: Malachy Moan M.D.   On: 12/17/2017 18:08   Ct Maxillofacial Wo Cm  Result Date: 12/17/2017 CLINICAL DATA:  Found down in his apartment. Laceration to left cheek. EXAM: CT HEAD WITHOUT CONTRAST CT MAXILLOFACIAL WITHOUT CONTRAST CT CERVICAL SPINE WITHOUT CONTRAST TECHNIQUE: Multidetector CT imaging of the head, cervical spine, and maxillofacial structures were performed using the standard protocol without intravenous contrast.  Multiplanar CT image reconstructions of the cervical spine and maxillofacial structures were also generated. COMPARISON:  Cervical spine x-rays dated August 03, 2008. 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 No evidence of hemorrhage, hydrocephalus, extra-axial collection or mass lesion/mass effect. Mild to moderate generalized cerebral atrophy. Mild scattered periventricular and subcortical white matter hypodensities are nonspecific, but favored to reflect chronic  microvascular ischemic changes. Vascular: Atherosclerotic vascular calcification of the carotid siphons. No hyperdense vessel. Skull: Normal. Negative for fracture or focal lesion. Other: Small left frontal scalp hematoma. CT MAXILLOFACIAL FINDINGS Osseous: No fracture or mandibular dislocation. No destructive process. Periapical lucencies involving the right maxillary first and second molars. Orbits: Negative. No traumatic or inflammatory finding. Sinuses: Chronic appearing mild-to-moderate moderate mucosal thickening of the paranasal sinuses, moderate in the left maxillary sinus. The mastoid air cells are clear. Soft tissues: Left cheek laceration. CT CERVICAL SPINE FINDINGS Alignment: Reversal of the normal cervical lordosis, centered at C5, unchanged. No traumatic malalignment. Skull base and vertebrae: No acute cervical spine fracture. Mild anterior superior endplate height loss at T1 with associated Schmorl's node. No primary bone lesion or focal pathologic process. Soft tissues and spinal canal: Mild prevertebral soft tissue thickening. Subcutaneous stranding and fluid in the left neck. No visible canal hematoma. Disc levels: Moderate to severe disc height loss at C2-C3, C5-C6, and C6-C7, progressed when compared to prior study. Mild bilateral neuroforaminal stenosis at C5-C6 and C6-C7. Upper chest: Negative. Other: 2.9 cm superficial subcutaneous cystic lesion in the right posterior neck, likely a sebaceous cyst. IMPRESSION: 1. 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 2. No acute cervical spine fracture. Mild prevertebral soft tissue thickening and subcutaneous stranding/fluid in the left neck. Ligamentous and/or soft tissue injury is not excluded. Recommend MRI cervical spine for further evaluation. 3. Mild anterior superior endplate height loss at T1 with associated Schmorl's node is favored chronic. 4.  No acute facial fracture. 5. Small left frontal scalp hematoma.   Left cheek laceration. Electronically Signed   By: Obie Dredge M.D.   On: 12/17/2017 12:06    Catarina Hartshorn, DO  [facility] Pager [phone]  If 7PM-7AM, please contact night-coverage [email or web address] Password [email or web address] 12/22/2017, 4:31 PM   LOS: 5 days

## 2017-12-23 LAB — GLUCOSE, CAPILLARY
GLUCOSE-CAPILLARY: 262 mg/dL — AB (ref 65–99)
Glucose-Capillary: 258 mg/dL — ABNORMAL HIGH (ref 65–99)
Glucose-Capillary: 261 mg/dL — ABNORMAL HIGH (ref 65–99)

## 2017-12-23 LAB — RENAL FUNCTION PANEL
ALBUMIN: 1.8 g/dL — AB (ref 3.5–5.0)
ANION GAP: 5 (ref 5–15)
BUN: 63 mg/dL — ABNORMAL HIGH (ref 6–20)
CALCIUM: 7.7 mg/dL — AB (ref 8.9–10.3)
CHLORIDE: 125 mmol/L — AB (ref 101–111)
CO2: 21 mmol/L — ABNORMAL LOW (ref 22–32)
Creatinine, Ser: 1.62 mg/dL — ABNORMAL HIGH (ref 0.61–1.24)
GFR calc Af Amer: 47 mL/min — ABNORMAL LOW (ref >60)
GFR, EST NON AFRICAN AMERICAN: 41 mL/min — AB (ref >60)
Glucose, Bld: 320 mg/dL — ABNORMAL HIGH (ref 65–99)
PHOSPHORUS: 3.5 mg/dL (ref 2.5–4.6)
POTASSIUM: 3.6 mmol/L (ref 3.5–5.1)
Sodium: 151 mmol/L — ABNORMAL HIGH (ref 135–145)

## 2017-12-23 MED ORDER — LORAZEPAM 2 MG/ML IJ SOLN
1.0000 mg | INTRAMUSCULAR | Status: DC | PRN
  Administered 2017-12-23: 1 mg via INTRAVENOUS
  Filled 2017-12-23: qty 1

## 2017-12-23 MED ORDER — DEXTROSE-NACL 5-0.45 % IV SOLN
INTRAVENOUS | Status: DC
  Administered 2017-12-23: 09:00:00 via INTRAVENOUS

## 2017-12-23 MED ORDER — MORPHINE SULFATE (PF) 2 MG/ML IV SOLN
2.0000 mg | INTRAVENOUS | Status: DC | PRN
  Administered 2017-12-23 – 2017-12-24 (×12): 2 mg via INTRAVENOUS
  Filled 2017-12-23 (×13): qty 1

## 2017-12-23 MED ORDER — METOPROLOL TARTRATE 5 MG/5ML IV SOLN
2.5000 mg | Freq: Four times a day (QID) | INTRAVENOUS | Status: AC
  Administered 2017-12-23: 2.5 mg via INTRAVENOUS
  Filled 2017-12-23: qty 5

## 2017-12-23 MED ORDER — GLYCOPYRROLATE 0.2 MG/ML IJ SOLN: 0.1000 mg | INTRAMUSCULAR | Status: DC

## 2017-12-23 MED ORDER — GLYCOPYRROLATE 0.2 MG/ML IJ SOLN
0.1000 mg | Freq: Four times a day (QID) | INTRAMUSCULAR | Status: DC
  Administered 2017-12-23 – 2017-12-24 (×4): 0.1 mg via INTRAVENOUS
  Filled 2017-12-23 (×3): qty 1

## 2017-12-23 NOTE — Progress Notes (Signed)
Patient having increased work of breathing and increase in RR, he is starting to be aggressive anytime staff takes physical care of him he pushes away and tried to fight back. I made the MD aware of this and that he seems to be in pain. MD is going to assess him and talk more to the family. No new orders at this time. RT and RN have suctioned Pt this am twice and got moderate amount of thick, tan-white sputum. Pt not following commands.  Makinzee Durley Shelia Media, RN 10:41 AM

## 2017-12-23 NOTE — Progress Notes (Signed)
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 Neighbors had not seen him since5/2/19and the police was present at his apartment for another reason and the apartment staff decided to check on the patient. They found him down on the floor unresponsive covered in urine. Brought into the emergency department where he was noted to be in atrial fibrillation with rapid ventricular response. He was given multiple medications including diltiazem and Lopressor to improve his heart rate. Ultimately was started on a diltiazem drip. After that his blood pressure dropped when he spiked a temperature to 1.9 in the emergency department. Blood pressure had been in the 180s systolic and then dropped to the 78/47 range. Received 4 L of IV fluid and his blood pressure has improved to the mid to low 100s currently 107/62. The patient was found to have markedly infected urine, and elevated troponin, elevated blood glucoses, and elevated bilirubin, and evaded CPK.The patient was given vancomycin and cefepime in the emergency department. Chest x-ray showed mild lingular and bilateral lower lobe opacities.He was given adenosine 6 mg and subsequently 12 mg. He was ultimately started on diltiazem drip. Cardiology was consulted for elevated troponins. They felt this to be due to demand ischemia.  The patient was started on broad-spectrum antibiotics for his UTI and aspiration pneumonia.  He was started on IV fluids.  The patient was evaluated by speech therapy who did not feel the patient was safe for oral intake.  An NG tube was placed and enteral feedings were started.  Although there was some improvement in the  patient's renal function and markers of infection, the patient overall remained somnolent and a phasic.  Palliative medicine was consulted.  Multiple family meetings took place.  The patient's poor prognosis and very low chance of meaningful recovery to his premorbid state were discussed with the patient's family.  The patient was made DNR.  Ultimately, the family felt that it was in the best interest of the patient to help maintain the patient's dignity.  As result, they agreed to transition the patient's focus of care to focus on full comfort.  All measures with curative intent were discontinued.  Subsequent treatment was focused solely on the patient's comfort.  Social work was consulted to help transition to residential hospice.    Assessment/Plan: Sepsis -Secondary to UTI and aspiration pneumonia -Lactic acid peaked at 6.1 -Procalcitonin to46.76 -continue merrem>>ceftriaxone/clinda -WBC increased--suspect may be due to steroids>>>now improving  Acute metabolic encephalopathy -Multifactorial including sepsis, stroke, acute on chronic renal failure, hypernatremia -Patientremains somnolent, but aphasic from stroke -EEG--moderate diffuse slowing - Multiple family meetings took place.  The patient's poor prognosis and very low chance of meaningful recovery to his premorbid state were discussed with the patient's family.  The patient was made DNR.  Ultimately, the family felt that it was in the best interest of the patient to help maintain the patient's dignity.  As result, they agreed to transition the patient's focus of care to focus on full comfort.  Cheyne-Stokes Respirations -secondary to large ACA and MCA stroke -no oxygen desaturation -monitor clinically  UTI--Klebsiella -ContinueMeropenem>>>ceftriaxone -de-escalate to ceftriaxone  Aspiration pneumonia -Continuemeropenem>>>changed to ceftriaxone and clinda -personally reviewed CXR--bilateral lower lobe  opacities -Patient  continued to have difficulty handling his own secretions. -Multiple family meetings took place.  The patient's poor prognosis and very low chance of meaningful recovery to his premorbid state were discussed with the patient's family.  The patient was made DNR.  Ultimately, the family felt that it was in the best interest of the patient to help maintain the patient's dignity.  As result, they agreed to transition the patient's focus of care to focus on full comfort.  Acute embolic stroke -08/19/1094 MRI brain--large acute left ACA territory infarct and a left MCA infarct;multiple foci of acute ischemia in the left basal ganglia, left thalamus, right occipital lobe, right temporal lobe, right midbrain, and bilateral cerebellum -Neurology consultappreciated -12/18/2017 echo EF 40-45%, grade 1 DD, mild MR  Atrial fibrillation with RVR -Patient has converted to sinus rhythm, now back to afib with RVR -Continue IV metoprolol--increase to 2.5 mg q 6 hrs -Currently a poor candidate for anticoagulation, but defer to cardiology and neurology -digoxin added  Hypernatremia/Diabetes insipidus -concerned about central diabetes insipidus -serum osm 651 -urine osm 366 -nephrology consulted -continue D5W--increased rate -desmopressin per nephrolgy  Rhabdomyolysis, traumatic -CPK peaked at898>>145  Thrombocytopenia -Secondary to sepsis -Holding Lovenox and heparin -Serum B12--2095 -TSH 1.211 -PTT 29 -Check INR--1.25  Acute on chronic renal failure--CKD stage III -Secondary to volume depletion, sepsis, and rhabdomyolysis -Baseline creatinine 1.2-1.4 -Serum creatinine peaked to 2.58  Elevated troponin -Appreciate cardiology consult -Felt to be due to demand ischemia  Neck contusion -MRI cervical spine--negative for fracture. Cervical prevertebral edema more in the superficial soft tissues of the left lateral neck favoring posttraumatic etiology;no soft tissue  edema suggestive of ligamentous injury  Impaired Glucose tolerance -HbA1C--5.9 -allow for liberal glycemic control at this point  Goals of Care -appreciate palliative care consult -overall poor prognosis with low likelihood of recover to pre-morbid condition -12/20/17--discussed with family-->change to DNR -12/22/17--waiting for rest of family from out of state to arrive before transitioning to full comfort 12/23/17-- Multiple family meetings took place.  The patient's poor prognosis and very low chance of meaningful recovery to his premorbid state were discussed with the patient's family.  The patient was made DNR.  Ultimately, the family felt that it was in the best interest of the patient to help maintain the patient's dignity.  As result, they agreed to transition the patient's focus of care to focus on full comfort. -discontinue measures with curative intent -morphine and ativan for dyspnea and agitation -robinul for secretions  -Advance care planning, including the explanation and discussion of advance directives was carried out with the patient and family. Code status including explanations of "Full Code" and "DNR" and alternatives were discussed in detail. Discussion of end-of-life issues including but not limited palliative care, hospice care and the concept of hospice, other end-of-life care options, power of attorney for health care decisions, living wills, and physician orders for life-sustaining treatment were also discussed with the patient and family. Total face to face time .   The patient is critically ill with multiple organ systems failure and requires high complexity decision making for assessment and support, frequent evaluation and titration of therapies, application of advanced monitoring technologies and extensive interpretation of multiple databases.  Critical care time -35 minutes     Disposition Plan:Residential hospice vs in hospital  death Family Communication:Daughter2 updatedat bedside 5/12  Consultants:Palliative medicine, cardiology, nephrology  Code Status: FULL   DVT Prophylaxis: SCDs   Procedures: As Listed in Progress Note Above  Antibiotics: vanco 5/6 Cefepime 5/6 azithro  5/6 Unasyn 5/7>>>5/8 merrem 5/8>>>5/10 ceftriaxone 5/10>>>5/12 clinda 5/10>>>5/12     Subjective: Patient opens his eyes to exam.  He is a phasic.  The patient has been more agitated with any kind of movement.  There is no reports of vomiting, respiratory distress.  The patient has had some loose stools.  Objective: Vitals:   12/23/17 0930 12/23/17 1000 12/23/17 1030 12/23/17 1100  BP: 103/69 107/63 127/67 (!) 113/54  Pulse: (!) 113 (!) 121 (!) 121 (!) 126  Resp: (!) 24 (!) 25 (!) 32 (!) 22  Temp:      TempSrc:      SpO2: 96% 95% 96% 95%  Weight:      Height:        Intake/Output Summary (Last 24 hours) at 12/23/2017 1144 Last data filed at 12/23/2017 8329 Gross per 24 hour  Intake 4009.83 ml  Output 1800 ml  Net 2209.83 ml   Weight change: 1.1 kg (2 lb 6.8 oz) Exam:   General:  Pt is alert, does not follow commands appropriately, not in acute distress  HEENT: No icterus, No thrush, No neck mass, [place]/AT  Cardiovascular: IRRR, S1/S2, no rubs, no gallops  Respiratory: bilateral scattered rhonchi.  No wheezing.  Abdomen: Soft/+BS, non tender, non distended, no guarding  Extremities: No edema, No lymphangitis, No petechiae, No rashes, no synovitis   Data Reviewed: I have personally reviewed following labs and imaging studies Basic Metabolic Panel: Recent Labs  Lab 12/17/17 1146  12/20/17 0439 12/21/17 0414 12/22/17 0342 12/22/17 1428 12/23/17 0451  NA  --    < > 158* 163* 161* 156* 151*  K  --    < > 3.9 3.8 4.3 3.9 3.6  CL  --    < > 128* >130* 134* 128* 125*  CO2  --    < > 21* 24 22 22  21*  GLUCOSE  --    < > 309* 196* 269* 267* 320*  BUN  --    < > 86* 67* 62* 65* 63*   CREATININE  --    < > 1.96* 1.55* 1.59* 1.79* 1.62*  CALCIUM  --    < > 8.6* 8.5* 8.1* 7.9* 7.7*  MG 2.8*  --   --  3.0*  --   --   --   PHOS  --   --   --   --   --  3.2 3.5   < > = values in this interval not displayed.   Liver Function Tests: Recent Labs  Lab 12/17/17 1105 12/18/17 0424 12/20/17 0439 12/22/17 0342 12/22/17 1428 12/23/17 0451  AST 74* 71* 31 159*  --   --   ALT 30 23 22  82*  --   --   ALKPHOS 45 35* 38 58  --   --   BILITOT 3.5* 3.8* 1.3* 1.4*  --   --   PROT 8.4* 6.6 6.6 5.7*  --   --   ALBUMIN 3.0* 2.2* 2.2* 1.9* 1.8* 1.8*   No results for input(s): LIPASE, AMYLASE in the last 168 hours. No results for input(s): AMMONIA in the last 168 hours. Coagulation Profile: Recent Labs  Lab 12/20/17 0439  INR 1.25   CBC: Recent Labs  Lab 12/18/17 0424 12/19/17 0421 12/20/17 0439 12/21/17 0414 12/22/17 0342  WBC 26.6* 28.2* 34.4* 26.1* 16.2*  NEUTROABS  --   --   --  24.0*  --   HGB 11.0* 10.8* 10.9* 10.6* 10.3*  HCT 32.8* 33.3* 32.9*  32.0* 31.9*  MCV 97.3 97.4 97.1 99.7 100.9*  PLT 38* 42* 30* 26* 29*   Cardiac Enzymes: Recent Labs  Lab 12/17/17 1105 12/17/17 1759 12/18/17 0047 12/18/17 0424 12/20/17 0439  CKTOTAL 898*  --   --   --  145  TROPONINI 0.90* 0.88* 0.65* 0.54*  --    BNP: Invalid input(s): POCBNP CBG: Recent Labs  Lab 12/22/17 1616 12/22/17 1939 12/23/17 0004 12/23/17 0352 12/23/17 0738  GLUCAP 209* 225* 258* 261* 262*   HbA1C: No results for input(s): HGBA1C in the last 72 hours. Urine analysis:    Component Value Date/Time   COLORURINE AMBER (A) 12/17/2017 1105   APPEARANCEUR CLOUDY (A) 12/17/2017 1105   LABSPEC 1.015 12/17/2017 1105   PHURINE 5.0 12/17/2017 1105   GLUCOSEU NEGATIVE 12/17/2017 1105   HGBUR LARGE (A) 12/17/2017 1105   BILIRUBINUR NEGATIVE 12/17/2017 1105   KETONESUR 5 (A) 12/17/2017 1105   PROTEINUR 30 (A) 12/17/2017 1105   NITRITE NEGATIVE 12/17/2017 1105   LEUKOCYTESUR MODERATE (A)  12/17/2017 1105   Sepsis Labs: @LABRCNTIP (procalcitonin:4,lacticidven:4) ) Recent Results (from the past 240 hour(s))  Urine culture     Status: Abnormal   Collection Time: 12/17/17 11:05 AM  Result Value Ref Range Status   Specimen Description   Final    URINE, RANDOM Performed at [facility], [address], [place], [place] [address]    Special Requests   Final    NONE Performed at [facility], [address] [place], [place] [address]    Culture >=100,000 COLONIES/mL KLEBSIELLA OXYTOCA (A)  Final   Report Status 12/20/2017 FINAL  Final   Organism ID, Bacteria KLEBSIELLA OXYTOCA (A)  Final      Susceptibility   Klebsiella oxytoca - MIC*    AMPICILLIN >=32 RESISTANT Resistant     CEFAZOLIN >=64 RESISTANT Resistant     CEFTRIAXONE <=1 SENSITIVE Sensitive     CIPROFLOXACIN <=0.25 SENSITIVE Sensitive     GENTAMICIN <=1 SENSITIVE Sensitive     IMIPENEM 0.5 SENSITIVE Sensitive     NITROFURANTOIN 64 INTERMEDIATE Intermediate     TRIMETH/SULFA <=20 SENSITIVE Sensitive     AMPICILLIN/SULBACTAM >=32 RESISTANT Resistant     PIP/TAZO >=128 RESISTANT Resistant     Extended ESBL NEGATIVE Sensitive     * >=100,000 COLONIES/mL KLEBSIELLA OXYTOCA  Blood culture (routine x 2)     Status: None   Collection Time: 12/17/17  7:21 PM  Result Value Ref Range Status   Specimen Description BLOOD RIGHT ARM DRAWN BY RN  Final   Special Requests   Final    BOTTLES DRAWN AEROBIC AND ANAEROBIC Blood Culture adequate volume   Culture   Final    NO GROWTH 5 DAYS Performed at [facility], [address], [place], [place] [address]    Report Status 12/22/2017 FINAL  Final  Blood culture (routine x 2)     Status: None   Collection Time: 12/17/17  7:21 PM  Result Value Ref Range Status   Specimen Description BLOOD RIGHT WRIST DRAWN BY RN  Final   Special Requests   Final    BOTTLES DRAWN AEROBIC AND ANAEROBIC Blood Culture adequate volume   Culture   Final    NO GROWTH 5 DAYS Performed at [facility], [address], [place], [place] [address]    Report Status 12/22/2017 FINAL  Final  MRSA PCR Screening     Status: Abnormal   Collection Time: 12/17/17  8:08 PM  Result Value Ref Range  Status   MRSA by PCR POSITIVE (A) NEGATIVE Final    Comment:        The GeneXpert MRSA Assay (FDA approved for NASAL specimens only), is one component of a comprehensive MRSA colonization surveillance program. It is not intended to diagnose MRSA infection nor to guide or monitor treatment for MRSA infections. RESULT CALLED TO, READ BACK BY AND VERIFIED WITH: AMBURN,A. AT 0253 ON 12/18/2017 BY EVA Performed at [facility], [address], [place], [place] [address]      Scheduled Meds: . glycopyrrolate  0.1 mg Intravenous Q6H  . mouth rinse  15 mL Mouth Rinse BID  . metoprolol tartrate  2.5 mg Intravenous Q6H   Continuous Infusions:  Procedures/Studies: Ct Head Wo Contrast  Result Date: 12/17/2017 CLINICAL DATA:  Found down in his apartment. Laceration to left cheek. EXAM: CT HEAD WITHOUT CONTRAST CT MAXILLOFACIAL WITHOUT CONTRAST CT CERVICAL SPINE WITHOUT CONTRAST TECHNIQUE: Multidetector CT imaging of the head, cervical spine, and maxillofacial structures were performed using the standard protocol without intravenous contrast. Multiplanar CT image reconstructions of the cervical spine and maxillofacial structures were also generated. COMPARISON:  Cervical spine x-rays dated August 03, 2008. 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 No evidence of hemorrhage, hydrocephalus, extra-axial collection or mass lesion/mass effect. Mild to moderate generalized cerebral atrophy. Mild scattered periventricular and subcortical white matter hypodensities are nonspecific, but favored to reflect chronic microvascular ischemic changes. Vascular: Atherosclerotic vascular calcification of the carotid siphons. No hyperdense vessel. Skull:  Normal. Negative for fracture or focal lesion. Other: Small left frontal scalp hematoma. CT MAXILLOFACIAL FINDINGS Osseous: No fracture or mandibular dislocation. No destructive process. Periapical lucencies involving the right maxillary first and second molars. Orbits: Negative. No traumatic or inflammatory finding. Sinuses: Chronic appearing mild-to-moderate moderate mucosal thickening of the paranasal sinuses, moderate in the left maxillary sinus. The mastoid air cells are clear. Soft tissues: Left cheek laceration. CT CERVICAL SPINE FINDINGS Alignment: Reversal of the normal cervical lordosis, centered at C5, unchanged. No traumatic malalignment. Skull base and vertebrae: No acute cervical spine fracture. Mild anterior superior endplate height loss at T1 with associated Schmorl's node. No primary bone lesion or focal pathologic process. Soft tissues and spinal canal: Mild prevertebral soft tissue thickening. Subcutaneous stranding and fluid in the left neck. No visible canal hematoma. Disc levels: Moderate to severe disc height loss at C2-C3, C5-C6, and C6-C7, progressed when compared to prior study. Mild bilateral neuroforaminal stenosis at C5-C6 and C6-C7. Upper chest: Negative. Other: 2.9 cm superficial subcutaneous cystic lesion in the right posterior neck, likely a sebaceous cyst. IMPRESSION: 1. 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 2. No acute cervical spine fracture. Mild prevertebral soft tissue thickening and subcutaneous stranding/fluid in the left neck. Ligamentous and/or soft tissue injury is not excluded. Recommend MRI cervical spine for further evaluation. 3. Mild anterior superior endplate height loss at T1 with associated Schmorl's node is favored chronic. 4.  No acute facial fracture. 5. Small left frontal scalp hematoma.  Left cheek laceration. Electronically Signed   By: Obie Dredge M.D.   On: 12/17/2017 12:06   Ct Cervical Spine Wo  Contrast  Result Date: 12/17/2017 CLINICAL DATA:  Found down in his apartment. Laceration to left cheek. EXAM: CT HEAD WITHOUT CONTRAST CT MAXILLOFACIAL WITHOUT CONTRAST CT CERVICAL SPINE WITHOUT CONTRAST TECHNIQUE: Multidetector CT imaging of the head, cervical spine, and maxillofacial structures were performed using the standard protocol without intravenous contrast.  Multiplanar CT image reconstructions of the cervical spine and maxillofacial structures were also generated. COMPARISON:  Cervical spine x-rays dated August 03, 2008. 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 No evidence of hemorrhage, hydrocephalus, extra-axial collection or mass lesion/mass effect. Mild to moderate generalized cerebral atrophy. Mild scattered periventricular and subcortical white matter hypodensities are nonspecific, but favored to reflect chronic microvascular ischemic changes. Vascular: Atherosclerotic vascular calcification of the carotid siphons. No hyperdense vessel. Skull: Normal. Negative for fracture or focal lesion. Other: Small left frontal scalp hematoma. CT MAXILLOFACIAL FINDINGS Osseous: No fracture or mandibular dislocation. No destructive process. Periapical lucencies involving the right maxillary first and second molars. Orbits: Negative. No traumatic or inflammatory finding. Sinuses: Chronic appearing mild-to-moderate moderate mucosal thickening of the paranasal sinuses, moderate in the left maxillary sinus. The mastoid air cells are clear. Soft tissues: Left cheek laceration. CT CERVICAL SPINE FINDINGS Alignment: Reversal of the normal cervical lordosis, centered at C5, unchanged. No traumatic malalignment. Skull base and vertebrae: No acute cervical spine fracture. Mild anterior superior endplate height loss at T1 with associated Schmorl's node. No primary bone lesion or focal pathologic process. Soft tissues and spinal canal: Mild  prevertebral soft tissue thickening. Subcutaneous stranding and fluid in the left neck. No visible canal hematoma. Disc levels: Moderate to severe disc height loss at C2-C3, C5-C6, and C6-C7, progressed when compared to prior study. Mild bilateral neuroforaminal stenosis at C5-C6 and C6-C7. Upper chest: Negative. Other: 2.9 cm superficial subcutaneous cystic lesion in the right posterior neck, likely a sebaceous cyst. IMPRESSION: 1. 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 2. No acute cervical spine fracture. Mild prevertebral soft tissue thickening and subcutaneous stranding/fluid in the left neck. Ligamentous and/or soft tissue injury is not excluded. Recommend MRI cervical spine for further evaluation. 3. Mild anterior superior endplate height loss at T1 with associated Schmorl's node is favored chronic. 4.  No acute facial fracture. 5. Small left frontal scalp hematoma.  Left cheek laceration. Electronically Signed   By: Obie Dredge M.D.   On: 12/17/2017 12:06   Mr Maxine Glenn Head Wo Contrast  Result Date: 12/18/2017 CLINICAL DATA:  Altered mental status EXAM: MRI HEAD WITHOUT CONTRAST MRA HEAD WITHOUT CONTRAST TECHNIQUE: Multiplanar, multiecho pulse sequences of the brain and surrounding structures were obtained without intravenous contrast. Angiographic images of the head were obtained using MRA technique without contrast. COMPARISON:  None. FINDINGS: MRI HEAD FINDINGS Brain: Multifocal abnormal diffusion restriction. The largest areas involve the left anterior and middle cerebral artery territories. There are smaller infarct scattered within the left deep gray structures, right occipital lobe, right temporal lobe, right midbrain and both cerebellar hemispheres. Extensive hyperintense T2-weighted signal corresponds to the areas of ischemia. There is no midline shift. No acute hemorrhage. No mass lesion. No chronic microhemorrhage or cerebral amyloid angiopathy. No  hydrocephalus, age advanced atrophy or lobar predominant volume loss. No dural abnormality or extra-axial collection. Skull and upper cervical spine: The visualized skull base, calvarium, upper cervical spine and extracranial soft tissues are normal. Sinuses/Orbits: Moderate left maxillary sinus mucosal disease. Small amount of bilateral mastoid fluid. Normal orbits. MRA HEAD FINDINGS Intracranial internal carotid arteries: Normal. Anterior cerebral arteries: The left anterior cerebral arteries occluded of the proximal A2 segment. Normal right ACA. Middle cerebral arteries: There is occlusion of the left middle cerebral artery inferior division. Superior division and M1 segment are normal. Right MCA is normal. Posterior communicating arteries: Present bilaterally. Posterior cerebral arteries:  Bilateral fetal origins.  Normal. Basilar artery: Normal. Vertebral arteries: Left dominant. Distal right vertebral artery appears occluded or severely stenotic. Superior cerebellar arteries: Normal. Anterior inferior cerebellar arteries: Normal. Posterior inferior cerebellar arteries: Nonvisualization of right PICA. Normal left. IMPRESSION: 1. 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 No acute hemorrhage or herniation. 2. Multiple small foci of acute ischemia within the left basal ganglia, left thalamus, right occipital lobe, right temporal lobe, right midbrain and bilaterally within the cerebellum. 3. 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 4. Occlusion of the proximal to midportion of the left anterior cerebral artery A2 segment. Electronically Signed   By: Deatra Robinson M.D.   On: 12/18/2017 17:49   Mr Brain Wo Contrast  Result Date: 12/18/2017 CLINICAL DATA:  Altered mental status EXAM: MRI HEAD WITHOUT CONTRAST MRA HEAD WITHOUT CONTRAST TECHNIQUE: Multiplanar, multiecho pulse sequences of the brain and  surrounding structures were obtained without intravenous contrast. Angiographic images of the head were obtained using MRA technique without contrast. COMPARISON:  None. FINDINGS: MRI HEAD FINDINGS Brain: Multifocal abnormal diffusion restriction. The largest areas involve the left anterior and middle cerebral artery territories. There are smaller infarct scattered within the left deep gray structures, right occipital lobe, right temporal lobe, right midbrain and both cerebellar hemispheres. Extensive hyperintense T2-weighted signal corresponds to the areas of ischemia. There is no midline shift. No acute hemorrhage. No mass lesion. No chronic microhemorrhage or cerebral amyloid angiopathy. No hydrocephalus, age advanced atrophy or lobar predominant volume loss. No dural abnormality or extra-axial collection. Skull and upper cervical spine: The visualized skull base, calvarium, upper cervical spine and extracranial soft tissues are normal. Sinuses/Orbits: Moderate left maxillary sinus mucosal disease. Small amount of bilateral mastoid fluid. Normal orbits. MRA HEAD FINDINGS Intracranial internal carotid arteries: Normal. Anterior cerebral arteries: The left anterior cerebral arteries occluded of the proximal A2 segment. Normal right ACA. Middle cerebral arteries: There is occlusion of the left middle cerebral artery inferior division. Superior division and M1 segment are normal. Right MCA is normal. Posterior communicating arteries: Present bilaterally. Posterior cerebral arteries: Bilateral fetal origins.  Normal. Basilar artery: Normal. Vertebral arteries: Left dominant. Distal right vertebral artery appears occluded or severely stenotic. Superior cerebellar arteries: Normal. Anterior inferior cerebellar arteries: Normal. Posterior inferior cerebellar arteries: Nonvisualization of right PICA. Normal left. IMPRESSION: 1. Large territory acute infarcts of the left anterior cerebral artery and left middle cerebral  artery inferior division. No acute hemorrhage or herniation. 2. Multiple small foci of acute ischemia within the left basal ganglia, left thalamus, right occipital lobe, right temporal lobe, right midbrain and bilaterally within the cerebellum. 3. 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 4. Occlusion of the proximal to midportion of the left anterior cerebral artery A2 segment. Electronically Signed   By: Deatra Robinson M.D.   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 Electronically Signed   By: Sebastian Ache M.D.   On: 12/18/2017 12:32   Result Date: 12/18/2017 CLINICAL DATA:  Found down on floor unresponsive. Prevertebral swelling on CT. EXAM: MRI CERVICAL SPINE WITHOUT CONTRAST TECHNIQUE: Multiplanar, multisequence MR imaging of the cervical spine was performed. No intravenous contrast was administered. COMPARISON:  Cervical spine CT 12/17/2017  FINDINGS: Alignment: Mild reversal the normal cervical lordosis. Trace retrolisthesis of C2 on C3, C5 on C6, and C6 on C7. Vertebrae: T1 superior endplate Schmorl's node without marrow edema. Advanced disc degeneration at C5-6 and C6-7 with T2/stir hyperintensity diffusely throughout both disc spaces and mild endplate edema. Cord: Normal signal. Posterior Fossa, vertebral arteries, paraspinal tissues: Prevertebral fluid extends from C2-C7 and measures up to 1 cm in AP thickness at the C3-4 level. Edema in the more superficial soft tissues of the left lateral neck was more fully imaged on the earlier CT and may favor a posttraumatic etiology for the prevertebral fluid. No discrete anterior longitudinal ligament disruption is identified. There is no soft tissue edema suggestive of posterior ligamentous complex injury.  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 Preserved vertebral artery flow voids in the neck. Abnormal appearance of the right V4 segment. 2.9 cm T2 hyperintense subcutaneous lesion in the posterior right lower neck, likely a sebaceous cyst. Disc levels: C2-3: Severe disc space narrowing. Disc bulging, uncovertebral spurring, infolding of the ligamentum flavum, and moderate right facet arthrosis result in moderate spinal stenosis and mild-to-moderate right and severe left neural foraminal stenosis. C3-4: Small to moderate-sized central disc extrusion with mild superior migration and infolding of the ligamentum flavum result in moderate spinal stenosis. No significant neural foraminal stenosis. C4-5: Broad posterior disc protrusion results in mild spinal stenosis. Mild bilateral neural foraminal stenosis. C5-6: Severe disc space narrowing. Broad-based posterior disc osteophyte complex results in mild-to-moderate spinal stenosis and severe bilateral neural foraminal stenosis. C6-7: Severe disc space narrowing. Broad-based posterior disc osteophyte complex results in mild-to-moderate spinal stenosis and severe bilateral neural foraminal stenosis. C7-T1: Minimal disc bulging and mild facet arthrosis without stenosis. IMPRESSION: 1. Diffuse cervical prevertebral fluid/edema. This could reflect posttraumatic effusion and soft tissue injury versus early infectious discitis at C5-6 and C6-7. 2. 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 Electronically Signed: By: Sebastian Ache M.D. On: 12/18/2017 11:44   Dg Chest Port 1 View  Result Date: 12/22/2017 CLINICAL DATA:  73 year old male with fever.  Follow-up radiograph. EXAM: PORTABLE CHEST 1 VIEW COMPARISON:  Chest radiograph dated 12/20/2017 FINDINGS: Enteric tube extending into the distal stomach with tip beyond the inferior margin of the  image. There is shallow inspiration. No focal consolidation, pleural effusion, or pneumothorax. The cardiac silhouette is within normal limits. No acute osseous pathology. IMPRESSION: 1. No acute cardiopulmonary process. 2. Enteric tube in similar position with tip beyond the inferior margin of the image. Electronically Signed   By: Elgie Collard M.D.   On: 12/22/2017 04:07   Dg Chest Port 1 View  Result Date: 12/20/2017 CLINICAL DATA:  Aspiration pneumonia, found unresponsive 3 days ago EXAM: PORTABLE CHEST 1 VIEW COMPARISON:  12/18/2017 FINDINGS: Persistent low lung volumes with minor basilar atelectasis. No definite pneumonia, collapse or consolidation. Negative for edema, effusion or pneumothorax. Exam is rotated to the left. Aorta is atherosclerotic. No acute osseous finding. IMPRESSION: Persistent low lung volumes with basilar atelectasis. Electronically Signed   By: Judie Petit.  Shick M.D.   On: 12/20/2017 10:20   Portable Chest X-ray 1 View  Result Date: 12/18/2017 CLINICAL DATA:  Sepsis, AFib EXAM: PORTABLE CHEST 1 VIEW COMPARISON:  12/17/2017 FINDINGS: Mild lingular and bilateral lower lobe opacities, likely atelectasis, although pneumonia remains possible. No frank interstitial edema.  No pleural effusion or pneumothorax. The heart is normal in size. IMPRESSION: Mild lingular  and bilateral lower lobe opacities, likely atelectasis, although pneumonia remains possible. Electronically Signed   By: Charline Bills M.D.   On: 12/18/2017 07:35   Dg Chest Port 1 View  Result Date: 12/17/2017 CLINICAL DATA:  Chest pain, altered mental status EXAM: PORTABLE CHEST 1 VIEW COMPARISON:  Chest x-ray of 05/18/2017 FINDINGS: No active infiltrate or effusion is seen. Mediastinal and hilar contours are unremarkable. The heart is borderline enlarged and stable. No bony abnormality is seen. IMPRESSION: No active lung disease.  Heart upper normal in size. Electronically Signed   By: Dwyane Dee M.D.   On: 12/17/2017  11:56   Dg Chest Port 1v Same Day  Result Date: 12/20/2017 CLINICAL DATA:  NG tube placement. EXAM: PORTABLE CHEST 1 VIEW COMPARISON:  12/20/2017. FINDINGS: Normal heart size. Clear lung fields. No bony abnormality. Nasogastric tube has passed through the stomach, with its tip in the second duodenum. IMPRESSION: Enteric placement of nasogastric tube as described. No active cardiopulmonary disease. Electronically Signed   By: Elsie Stain M.D.   On: 12/20/2017 19:24   Dg Chest Port 1v Same Day  Result Date: 12/17/2017 CLINICAL DATA:  72 year old male with code sepsis, and evolving lung sounds EXAM: PORTABLE CHEST 1 VIEW COMPARISON:  Chest x-ray obtained earlier today at 11:40 a.m. FINDINGS: Significantly lower inspiratory volumes with developing lingular and left lower lobe streaky airspace opacities favored to reflect atelectasis. No overt pulmonary edema, pneumothorax or pleural effusion. Stable cardiac and mediastinal contours. No acute osseous abnormality. IMPRESSION: 1. Lower inspiratory volumes with developing streaky lingular and left lower lobe airspace opacities favored to reflect atelectasis. Electronically Signed   By: Malachy Moan M.D.   On: 12/17/2017 18:08   Ct Maxillofacial Wo Cm  Result Date: 12/17/2017 CLINICAL DATA:  Found down in his apartment. Laceration to left cheek. EXAM: CT HEAD WITHOUT CONTRAST CT MAXILLOFACIAL WITHOUT CONTRAST CT CERVICAL SPINE WITHOUT CONTRAST TECHNIQUE: Multidetector CT imaging of the head, cervical spine, and maxillofacial structures were performed using the standard protocol without intravenous contrast. Multiplanar CT image reconstructions of the cervical spine and maxillofacial structures were also generated. COMPARISON:  Cervical spine x-rays dated August 03, 2008. 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 No evidence of hemorrhage, hydrocephalus, extra-axial collection or mass  lesion/mass effect. Mild to moderate generalized cerebral atrophy. Mild scattered periventricular and subcortical white matter hypodensities are nonspecific, but favored to reflect chronic microvascular ischemic changes. Vascular: Atherosclerotic vascular calcification of the carotid siphons. No hyperdense vessel. Skull: Normal. Negative for fracture or focal lesion. Other: Small left frontal scalp hematoma. CT MAXILLOFACIAL FINDINGS Osseous: No fracture or mandibular dislocation. No destructive process. Periapical lucencies involving the right maxillary first and second molars. Orbits: Negative. No traumatic or inflammatory finding. Sinuses: Chronic appearing mild-to-moderate moderate mucosal thickening of the paranasal sinuses, moderate in the left maxillary sinus. The mastoid air cells are clear. Soft tissues: Left cheek laceration. CT CERVICAL SPINE FINDINGS Alignment: Reversal of the normal cervical lordosis, centered at C5, unchanged. No traumatic malalignment. Skull base and vertebrae: No acute cervical spine fracture. Mild anterior superior endplate height loss at T1 with associated Schmorl's node. No primary bone lesion or focal pathologic process. Soft tissues and spinal canal: Mild prevertebral soft tissue thickening. Subcutaneous stranding and fluid in the left neck. No visible canal hematoma. Disc levels: Moderate to severe disc height loss at C2-C3, C5-C6, and C6-C7, progressed when compared to prior study. Mild bilateral neuroforaminal stenosis at C5-C6 and C6-C7.  Upper chest: Negative. Other: 2.9 cm superficial subcutaneous cystic lesion in the right posterior neck, likely a sebaceous cyst. IMPRESSION: 1. 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 2. No acute cervical spine fracture. Mild prevertebral soft tissue thickening and subcutaneous stranding/fluid in the left neck. Ligamentous and/or soft tissue injury is not excluded. Recommend MRI cervical spine for  further evaluation. 3. Mild anterior superior endplate height loss at T1 with associated Schmorl's node is favored chronic. 4.  No acute facial fracture. 5. Small left frontal scalp hematoma.  Left cheek laceration. Electronically Signed   By: Obie Dredge M.D.   On: 12/17/2017 12:06    Catarina Hartshorn, DO  [facility] Pager [phone]  If 7PM-7AM, please contact night-coverage [email or web address] Password [email or web address] 12/23/2017, 11:44 AM   LOS: 6 days

## 2017-12-23 NOTE — Progress Notes (Signed)
Patient family is now at bedside and is ready to transition over to comfort care. Dr. Arbutus Leas aware  Jahnai Slingerland Shelia Media, RN

## 2017-12-23 NOTE — Progress Notes (Signed)
Patient still collects secretion's in back of throat. Seems more awake. Lungs appear decreased except for transmitted upper airway noise.Cough spontaneous not on command.

## 2017-12-23 NOTE — Progress Notes (Signed)
Subjective: Interval History: none.  Objective: Vital signs in last 24 hours: Temp:  [99 F (37.2 C)-100.9 F (38.3 C)] 100.9 F (38.3 C) (05/12 0741) Pulse Rate:  [91-114] 114 (05/12 0741) Resp:  [16-29] 18 (05/12 0741) BP: (99-126)/(53-67) 105/61 (05/12 0700) SpO2:  [85 %-100 %] 97 % (05/12 0741) Weight:  [77.5 kg (170 lb 13.7 oz)] 77.5 kg (170 lb 13.7 oz) (05/12 0500) Weight change: 1.1 kg (2 lb 6.8 oz)  Intake/Output from previous day: 05/11 0701 - 05/12 0700 In: 1425.8 [I.V.:1425.8] Out: 2100 [Urine:2100] Intake/Output this shift: No intake/output data recorded.  Generally patient is arousable and does not communicate .only moves his left hand Chest: Has bilateral inspiratory crackles Heart exam regular rate and rhythm Abdomen: Soft positive bowel sounds Extremities: He has trace edema   Lab Results: Recent Labs    12/21/17 0414 12/22/17 0342  WBC 26.1* 16.2*  HGB 10.6* 10.3*  HCT 32.0* 31.9*  PLT 26* 29*   BMET:  Recent Labs    12/22/17 1428 12/23/17 0451  NA 156* 151*  K 3.9 3.6  CL 128* 125*  CO2 22 21*  GLUCOSE 267* 320*  BUN 65* 63*  CREATININE 1.79* 1.62*  CALCIUM 7.9* 7.7*   No results for input(s): PTH in the last 72 hours. Iron Studies: No results for input(s): IRON, TIBC, TRANSFERRIN, FERRITIN in the last 72 hours.  Studies/Results: Dg Chest Port 1 View  Result Date: 12/22/2017 CLINICAL DATA:  72 year old male with fever.  Follow-up radiograph. EXAM: PORTABLE CHEST 1 VIEW COMPARISON:  Chest radiograph dated 12/20/2017 FINDINGS: Enteric tube extending into the distal stomach with tip beyond the inferior margin of the image. There is shallow inspiration. No focal consolidation, pleural effusion, or pneumothorax. The cardiac silhouette is within normal limits. No acute osseous pathology. IMPRESSION: 1. No acute cardiopulmonary process. 2. Enteric tube in similar position with tip beyond the inferior margin of the image. Electronically Signed    By: Elgie Collard M.D.   On: 12/22/2017 04:07    I have reviewed the patient's current medications.  Assessment/Plan: 1] hypernatremia:  Initial Serum osmolality is 366 mOsm /kg very high.  Urine osmolality is 651 mOsm /kg.  His serum osmolality  Was 366 and urine osmolality of 579 after DDAVP hence no response. NDI? Patient also with acute renal failure ie ATN. His sodium has improved form 156 to 151 today 2] renal failure: Acute : Presently his creatinine is progessively improving 3] history of nonischemic cardiomyopathy: On lasix he has 2100 cc of urine out put 4] History of sepsis: Aspiration pneumonia/UTI 5] history of ACA and left RCA infarct 6] history of seizure disorder Plan: 1]We will change his ivf to D5% 1/2 ns at 100 cc/hr to slow the correction of his hypernatremia 2] we will check his renal panel in the morning.   LOS: 6 days   Tiphanie Vo S 12/23/2017,8:24 AM

## 2017-12-24 DIAGNOSIS — Z515 Encounter for palliative care: Secondary | ICD-10-CM

## 2017-12-24 DIAGNOSIS — J690 Pneumonitis due to inhalation of food and vomit: Secondary | ICD-10-CM

## 2017-12-24 DIAGNOSIS — I639 Cerebral infarction, unspecified: Secondary | ICD-10-CM

## 2017-12-24 MED ORDER — MORPHINE SULFATE (CONCENTRATE) 10 MG/0.5ML PO SOLN: 5.0000 mg | Freq: Three times a day (TID) | ORAL | 0 refills | Status: AC

## 2017-12-24 MED ORDER — HALOPERIDOL LACTATE 5 MG/ML IJ SOLN: 0.5000 mg | INTRAMUSCULAR | Status: DC | PRN

## 2017-12-24 MED ORDER — BIOTENE DRY MOUTH MT LIQD: 15.0000 mL | OROMUCOSAL | Status: DC | PRN

## 2017-12-24 MED ORDER — GLYCOPYRROLATE 0.2 MG/ML IJ SOLN: 0.2000 mg | INTRAMUSCULAR | Status: DC | PRN

## 2017-12-24 MED ORDER — GLYCOPYRROLATE 1 MG PO TABS: 1.0000 mg | ORAL_TABLET | ORAL | Status: DC | PRN

## 2017-12-24 MED ORDER — POLYVINYL ALCOHOL 1.4 % OP SOLN: 1.0000 [drp] | Freq: Four times a day (QID) | OPHTHALMIC | Status: DC | PRN

## 2017-12-24 MED ORDER — HALOPERIDOL 0.5 MG PO TABS: 0.5000 mg | ORAL_TABLET | ORAL | Status: DC | PRN

## 2017-12-24 MED ORDER — HALOPERIDOL LACTATE 2 MG/ML PO CONC
0.5000 mg | ORAL | Status: DC | PRN
  Filled 2017-12-24: qty 0.3

## 2017-12-24 MED ORDER — MORPHINE SULFATE (CONCENTRATE) 10 MG/0.5ML PO SOLN
5.0000 mg | Freq: Three times a day (TID) | ORAL | Status: DC
  Administered 2017-12-24: 5 mg via ORAL
  Filled 2017-12-24: qty 0.5

## 2017-12-24 MED ORDER — GLYCOPYRROLATE 0.2 MG/ML IJ SOLN
0.4000 mg | Freq: Three times a day (TID) | INTRAMUSCULAR | Status: DC
  Administered 2017-12-24: 0.4 mg via INTRAVENOUS
  Filled 2017-12-24: qty 2

## 2017-12-24 NOTE — Progress Notes (Signed)
Subjective: Interval History: none.  Objective: Vital signs in last 24 hours: Temp:  [98.4 F (36.9 C)-98.5 F (36.9 C)] 98.5 F (36.9 C) (05/13 0511) Pulse Rate:  [106-126] 117 (05/13 0511) Resp:  [15-32] 15 (05/13 0511) BP: (94-127)/(54-69) 105/69 (05/13 0511) SpO2:  [90 %-100 %] 99 % (05/13 0511) Weight change:   Intake/Output from previous day: 05/12 0701 - 05/13 0700 In: 4160.2 [I.V.:2343.3; NG/GT:1816.8] Out: 750 [Urine:750] Intake/Output this shift: No intake/output data recorded.  Generally patient is arousable and does not communicate .only moves his left hand Chest: Has bilateral inspiratory crackles Heart exam regular rate and rhythm Abdomen: Soft positive bowel sounds Extremities: He has trace edema   Lab Results: Recent Labs    12/22/17 0342  WBC 16.2*  HGB 10.3*  HCT 31.9*  PLT 29*   BMET:  Recent Labs    12/22/17 1428 12/23/17 0451  NA 156* 151*  K 3.9 3.6  CL 128* 125*  CO2 22 21*  GLUCOSE 267* 320*  BUN 65* 63*  CREATININE 1.79* 1.62*  CALCIUM 7.9* 7.7*   No results for input(s): PTH in the last 72 hours. Iron Studies: No results for input(s): IRON, TIBC, TRANSFERRIN, FERRITIN in the last 72 hours.  Studies/Results: No results found.  I have reviewed the patient's current medications.  Assessment/Plan: 1] hypernatremia:  his last sodium was 151 from yesterday. 2] renal failure: Acute : Presently his creatinine is progessively improving 3] history of nonischemic cardiomyopathy:  he has 500 cc of urine output. 4] History of sepsis: Aspiration pneumonia/UTI 5] history of ACA and left RCA infarct: Patient only respond to pain by withdrawing his hand. 6] history of seizure disorder Plan:  patient presently is put comfort only.  His IV fluid is discontinued and blood work was not done.  2] I will sign off and thank you for letting me participate in his care.   LOS: 7 days   Yovani Cogburn S 12/24/2017,9:40 AM

## 2017-12-24 NOTE — Care Management Important Message (Signed)
Important Message  Patient Details  Name: Juan Barber MRN: [id number] Date of Birth: February 22, 1946   Medicare Important Message Given:  Yes    Renie Ora 12/24/2017, 1:37 PM

## 2017-12-24 NOTE — Progress Notes (Signed)
Report called to [facility]. 

## 2017-12-24 NOTE — Progress Notes (Signed)
Reviewed chart.  Examined patient and spoke with family at bedside.   Patient has been transitioned to comfort.  Potentially able to be transferred to hospice house if bed was available.  PE:    Well developed male, sleeping soundly, eschar lesion on face.  Does not wake to voice or light touch. CV rrr Resp no distress on 2L Abdomen soft. ND Extremities are cold to touch    Assessment:  72 yo male.   Sepsis secondary to UTI and Asp pneumonia in the setting of stroke, seizure, afib.  Now actively dying.  Comfort measures only.  Recommendation:   DC to hospice if bed becomes available. Robinul to 0.4 mg TID Comfort measures only orders. Chaplain consulted for prayer. Patient appears comfortable.  Palliative will follow with you.  Norvel Richards, PA-C Palliative Medicine Pager: [phone]   Time 15 min.

## 2017-12-24 NOTE — Progress Notes (Signed)
Nutrition Brief Note  Chart reviewed. Pt now transitioning to full comfort care.  No further nutrition interventions warranted at this time.  Please re-consult as needed.   Royann Shivers MS,RD,CSG,LDN Office: [phone] Pager: [phone]

## 2017-12-24 NOTE — Clinical Social Work Note (Signed)
Cliffard, Shintani #[id number] (CSN: [id number]) (72 y.o. M) (Adm: 12/17/17)  [id number]  PCP   HAGLER, RACHEL  Demographics  Comment    Last edited by  on at   Address: Home Phone: Work Phone: Mobile Phone:  [address]  [place] [address]   [phone] -- [phone]  SSN: Insurance: Marital Status: Religion:  [id number] MEDICARE Divorced None  Patient Ethnicity & Race   Ethnic Group Patient Race  Not Hispanic or Latino Black or African American  Documents Filed to Patient   Power of Attorney Living Will Clinical Unknown Study Attachment Consent Form ABN Waiver After Visit Summary Lab Result Scan Code Status MyChart Status Advance Care Planning  Not on File Not on File Not on File Not on File Filed Not on File Mosetta Pigeon DNR [Updated on 12/24/17 0926] Code Exp Jump to the Activity  Admission Information   Attending Provider Admitting Provider Admission Type Admission Date/Time  Tat, Onalee Hua, MD Lahoma Crocker, MD Emergency 12/17/17 1046  Discharge Date Hospital Service Auth/Cert Status Service Area   Internal Medicine Incomplete [facility]  Unit Room/Bed Admission Status   AP-DEPT 300 A333/A333-01 Admission (Confirmed)   Hospital Account   Name Acct ID Class Status Primary Coverage  Itsuki, Ripoll [id number] Inpatient Open MEDICARE - MEDICARE PART A AND B      Guarantor Account (for Hospital Account [id number])   Name Relation to Pt Service Area Active? Acct Type  Carlis Stable Yes Personal/Family  Address Phone    [address] [place], [place] [address] [phone](H)        Coverage Information (for Hospital Account [id number])   1. MEDICARE/MEDICARE PART A AND B   F/O Payor/Plan Precert #  MEDICARE/MEDICARE PART A AND B   Subscriber Subscriber #  Deyonte, [place]upo [id number]  Address Phone  [address] [place], [place] [address]   2. TRICARE/TRICARE FOR LIFE   F/O Payor/Plan Precert #  TRICARE/TRICARE  FOR LIFE   Subscriber Subscriber #  Espen, Lorincz [id number]  Address Phone  [address] [place], [place] [address] [phone]

## 2017-12-24 NOTE — Discharge Summary (Signed)
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 Neighbors had not seen him since5/2/19and the police was present at his apartment for another reason and the apartment staff decided to check on the patient. They found him down on the floor unresponsive covered in urine. Brought into the emergency department where he was noted to be in atrial fibrillation with rapid ventricular response. He was given multiple medications including diltiazem and Lopressor to improve his heart rate. Ultimately was started on a diltiazem drip. After that his blood pressure dropped when he spiked a temperature to 1.9 in the emergency department. Blood pressure had been in the 180s systolic and then dropped to the 78/47 range. Received 4 L of IV fluid and his blood pressure has improved to the mid to low 100s currently 107/62. The patient was found to have markedly infected urine, and elevated troponin, elevated blood glucoses, and elevated bilirubin, and evaded CPK.The patient was given vancomycin and cefepime in the emergency department. Chest x-ray showed mild lingular and bilateral lower lobe opacities.He was given adenosine 6 mg and subsequently 12 mg. He was ultimately started on diltiazem drip. Cardiology was consulted for elevated troponins. They felt this to be due to demand ischemia.  The patient was started on broad-spectrum antibiotics for his UTI and aspiration pneumonia.  He was started on IV fluids.  The patient was  evaluated by speech therapy who did not feel the patient was safe for oral intake.  An NG tube was placed and enteral feedings were started.  Although there was some improvement in the patient's renal function and markers of infection, the patient overall remained somnolent and a phasic.  Palliative medicine was consulted.  Multiple family meetings took place.  The patient's poor prognosis and very low chance of meaningful recovery to his premorbid state were discussed with the patient's family.  The patient was made DNR.  Ultimately, the family felt that it was in the best interest of the patient to help maintain the patient's dignity.  As result, they agreed to transition the patient's focus of care to focus on full comfort.  All measures with curative intent were discontinued.  Subsequent treatment was focused solely on the patient's comfort.  Social work was consulted to help transition to residential hospice.    Discharge Diagnoses:  Sepsis -Secondary to UTI and aspiration pneumonia -Lactic acid peaked at 6.1 -Procalcitonin to46.76 -continue merrem>>ceftriaxone/clinda -WBC increased--suspect may be due to steroids>>>now improving -no longer active issue as pt focus of care has been transitioned to focus on full comfort  Acute metabolic encephalopathy -Multifactorial including sepsis, stroke, acute on chronic renal failure, hypernatremia -Patientremains somnolent, but aphasic from stroke -EEG--moderate diffuse slowing - Multiple family meetings took place.  The patient's poor prognosis and very low chance of meaningful recovery to his premorbid state were discussed with the patient's family.  The patient was made DNR.  Ultimately, the family felt that it was in the best interest of the patient to help maintain the patient's dignity.  As result, they agreed to transition the patient's focus of care to focus  on full comfort.  Cheyne-Stokes Respirations -secondary to large ACA and MCA  stroke -no oxygen desaturation -monitor clinically  UTI--Klebsiella -ContinueMeropenem>>>ceftriaxone -de-escalate to ceftriaxone -no longer active issue as pt focus of care has been transitioned to focus on full comfort   Aspiration pneumonia -Continuemeropenem>>>changed to ceftriaxone and clinda -personally reviewed CXR--bilateral lower lobe opacities -Patient continued to have difficulty handling his own secretions. -Multiple family meetings took place.  The patient's poor prognosis and very low chance of meaningful recovery to his premorbid state were discussed with the patient's family.  The patient was made DNR.  Ultimately, the family felt that it was in the best interest of the patient to help maintain the patient's dignity.  As result, they agreed to transition the patient's focus of care to focus on full comfort.  Acute embolic stroke -4/0/9811 MRI brain--large acute left ACA territory infarct and a left MCA infarct;multiple foci of acute ischemia in the left basal ganglia, left thalamus, right occipital lobe, right temporal lobe, right midbrain, and bilateral cerebellum -Neurology consultappreciated -12/18/2017 echo EF 40-45%, grade 1 DD, mild MR  Atrial fibrillation with RVR -Patient has converted to sinus rhythm, now back to afib with RVR -Continue IV metoprolol--increase to 2.5 mg q 6 hrs -Currently a poor candidate for anticoagulation, but defer to cardiology and neurology -digoxin added  Hypernatremia/Diabetes insipidus -concerned about central diabetes insipidus -serum osm651 -urine osm366 -nephrology consulted -continue D5W--increased rate -desmopressin per nephrolgy  Rhabdomyolysis, traumatic -CPK peaked at898>>145  Thrombocytopenia -Secondary to sepsis -Holding Lovenox and heparin -Serum B12--2095 -TSH 1.211 -PTT 29 -Check INR--1.25  Acute on chronic renal failure--CKD stage III -Secondary to volume depletion, sepsis, and  rhabdomyolysis -Baseline creatinine 1.2-1.4 -Serum creatinine peaked to 2.58  Elevated troponin -Appreciate cardiology consult -Felt to be due to demand ischemia  Neck contusion -MRI cervical spine--negative for fracture. Cervical prevertebral edema more in the superficial soft tissues of the left lateral neck favoring posttraumatic etiology;no soft tissue edema suggestive of ligamentous injury  Impaired Glucose tolerance -HbA1C--5.9 -allow for liberal glycemic control at this point  Goals of Care -appreciate palliative care consult -overall poor prognosis with low likelihood of recover to pre-morbid condition -12/20/17--discussed with family-->change to DNR -12/22/17--waiting for rest of family from out of state to arrive before transitioning to full comfort 12/23/17-- Multiple family meetings took place.  The patient's poor prognosis and very low chance of meaningful recovery to his premorbid state were discussed with the patient's family.  The patient was made DNR.  Ultimately, the family felt that it was in the best interest of the patient to help maintain the patient's dignity.  As result, they agreed to transition the patient's focus of care to focus on full comfort. -discontinue measures with curative intent -morphine and ativan for dyspnea and agitation -robinul for secretions  -Advance care planning, including the explanation and discussion of advance directives was carried out with the patient and family. Code status including explanations of "Full Code" and "DNR" and alternatives were discussed in detail. Discussion of end-of-life issues including but not limited palliative care, hospice care and the concept of hospice, other end-of-life care options, power of attorney for health care decisions, living wills, and physician orders for life-sustaining treatment were also discussed with the patient and family. Total face to face time .     Discharge  Instructions   Allergies as of 12/24/2017      Reactions   Other    Pt reports "older" medication used for migraines   Talwin [pentazocine]  Sweating, rapid heartbeat      Medication List    STOP taking these medications   losartan 25 MG tablet Commonly known as:  COZAAR   metoprolol succinate 100 MG 24 hr tablet Commonly known as:  TOPROL XL   metoprolol tartrate 50 MG tablet Commonly known as:  LOPRESSOR   rivaroxaban 20 MG Tabs tablet Commonly known as:  XARELTO     TAKE these medications   morphine CONCENTRATE 10 MG/0.5ML Soln concentrated solution Take 0.25 mLs (5 mg total) by mouth every 8 (eight) hours.       Allergies  Allergen Reactions  . Other     Pt reports "older" medication used for migraines  . Talwin [Pentazocine]     Sweating, rapid heartbeat    Consultations:  Palliative medicine  Renal  cardiology   Procedures/Studies: Ct Head Wo Contrast  Result Date: 12/17/2017 CLINICAL DATA:  Found down in his apartment. Laceration to left cheek. EXAM: CT HEAD WITHOUT CONTRAST CT MAXILLOFACIAL WITHOUT CONTRAST CT CERVICAL SPINE WITHOUT CONTRAST TECHNIQUE: Multidetector CT imaging of the head, cervical spine, and maxillofacial structures were performed using the standard protocol without intravenous contrast. Multiplanar CT image reconstructions of the cervical spine and maxillofacial structures were also generated. COMPARISON:  Cervical spine x-rays dated August 03, 2008. 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 No evidence of hemorrhage, hydrocephalus, extra-axial collection or mass lesion/mass effect. Mild to moderate generalized cerebral atrophy. Mild scattered periventricular and subcortical white matter hypodensities are nonspecific, but favored to reflect chronic microvascular ischemic changes. Vascular: Atherosclerotic vascular calcification of the carotid siphons. No hyperdense  vessel. Skull: Normal. Negative for fracture or focal lesion. Other: Small left frontal scalp hematoma. CT MAXILLOFACIAL FINDINGS Osseous: No fracture or mandibular dislocation. No destructive process. Periapical lucencies involving the right maxillary first and second molars. Orbits: Negative. No traumatic or inflammatory finding. Sinuses: Chronic appearing mild-to-moderate moderate mucosal thickening of the paranasal sinuses, moderate in the left maxillary sinus. The mastoid air cells are clear. Soft tissues: Left cheek laceration. CT CERVICAL SPINE FINDINGS Alignment: Reversal of the normal cervical lordosis, centered at C5, unchanged. No traumatic malalignment. Skull base and vertebrae: No acute cervical spine fracture. Mild anterior superior endplate height loss at T1 with associated Schmorl's node. No primary bone lesion or focal pathologic process. Soft tissues and spinal canal: Mild prevertebral soft tissue thickening. Subcutaneous stranding and fluid in the left neck. No visible canal hematoma. Disc levels: Moderate to severe disc height loss at C2-C3, C5-C6, and C6-C7, progressed when compared to prior study. Mild bilateral neuroforaminal stenosis at C5-C6 and C6-C7. Upper chest: Negative. Other: 2.9 cm superficial subcutaneous cystic lesion in the right posterior neck, likely a sebaceous cyst. IMPRESSION: 1. 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 2. No acute cervical spine fracture. Mild prevertebral soft tissue thickening and subcutaneous stranding/fluid in the left neck. Ligamentous and/or soft tissue injury is not excluded. Recommend MRI cervical spine for further evaluation. 3. Mild anterior superior endplate height loss at T1 with associated Schmorl's node is favored chronic. 4.  No acute facial fracture. 5. Small left frontal scalp hematoma.  Left cheek laceration. Electronically Signed   By: Obie Dredge M.D.   On: 12/17/2017 12:06   Ct Cervical Spine Wo  Contrast  Result Date: 12/17/2017 CLINICAL DATA:  Found down in his apartment. Laceration to left cheek. EXAM: CT HEAD WITHOUT CONTRAST CT MAXILLOFACIAL WITHOUT CONTRAST CT CERVICAL SPINE WITHOUT CONTRAST TECHNIQUE:  Multidetector CT imaging of the head, cervical spine, and maxillofacial structures were performed using the standard protocol without intravenous contrast. Multiplanar CT image reconstructions of the cervical spine and maxillofacial structures were also generated. COMPARISON:  Cervical spine x-rays dated August 03, 2008. 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 No evidence of hemorrhage, hydrocephalus, extra-axial collection or mass lesion/mass effect. Mild to moderate generalized cerebral atrophy. Mild scattered periventricular and subcortical white matter hypodensities are nonspecific, but favored to reflect chronic microvascular ischemic changes. Vascular: Atherosclerotic vascular calcification of the carotid siphons. No hyperdense vessel. Skull: Normal. Negative for fracture or focal lesion. Other: Small left frontal scalp hematoma. CT MAXILLOFACIAL FINDINGS Osseous: No fracture or mandibular dislocation. No destructive process. Periapical lucencies involving the right maxillary first and second molars. Orbits: Negative. No traumatic or inflammatory finding. Sinuses: Chronic appearing mild-to-moderate moderate mucosal thickening of the paranasal sinuses, moderate in the left maxillary sinus. The mastoid air cells are clear. Soft tissues: Left cheek laceration. CT CERVICAL SPINE FINDINGS Alignment: Reversal of the normal cervical lordosis, centered at C5, unchanged. No traumatic malalignment. Skull base and vertebrae: No acute cervical spine fracture. Mild anterior superior endplate height loss at T1 with associated Schmorl's node. No primary bone lesion or focal pathologic process. Soft tissues and spinal canal: Mild  prevertebral soft tissue thickening. Subcutaneous stranding and fluid in the left neck. No visible canal hematoma. Disc levels: Moderate to severe disc height loss at C2-C3, C5-C6, and C6-C7, progressed when compared to prior study. Mild bilateral neuroforaminal stenosis at C5-C6 and C6-C7. Upper chest: Negative. Other: 2.9 cm superficial subcutaneous cystic lesion in the right posterior neck, likely a sebaceous cyst. IMPRESSION: 1. 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 2. No acute cervical spine fracture. Mild prevertebral soft tissue thickening and subcutaneous stranding/fluid in the left neck. Ligamentous and/or soft tissue injury is not excluded. Recommend MRI cervical spine for further evaluation. 3. Mild anterior superior endplate height loss at T1 with associated Schmorl's node is favored chronic. 4.  No acute facial fracture. 5. Small left frontal scalp hematoma.  Left cheek laceration. Electronically Signed   By: Obie Dredge M.D.   On: 12/17/2017 12:06   Mr Maxine Glenn Head Wo Contrast  Result Date: 12/18/2017 CLINICAL DATA:  Altered mental status EXAM: MRI HEAD WITHOUT CONTRAST MRA HEAD WITHOUT CONTRAST TECHNIQUE: Multiplanar, multiecho pulse sequences of the brain and surrounding structures were obtained without intravenous contrast. Angiographic images of the head were obtained using MRA technique without contrast. COMPARISON:  None. FINDINGS: MRI HEAD FINDINGS Brain: Multifocal abnormal diffusion restriction. The largest areas involve the left anterior and middle cerebral artery territories. There are smaller infarct scattered within the left deep gray structures, right occipital lobe, right temporal lobe, right midbrain and both cerebellar hemispheres. Extensive hyperintense T2-weighted signal corresponds to the areas of ischemia. There is no midline shift. No acute hemorrhage. No mass lesion. No chronic microhemorrhage or cerebral amyloid angiopathy. No  hydrocephalus, age advanced atrophy or lobar predominant volume loss. No dural abnormality or extra-axial collection. Skull and upper cervical spine: The visualized skull base, calvarium, upper cervical spine and extracranial soft tissues are normal. Sinuses/Orbits: Moderate left maxillary sinus mucosal disease. Small amount of bilateral mastoid fluid. Normal orbits. MRA HEAD FINDINGS Intracranial internal carotid arteries: Normal. Anterior cerebral arteries: The left anterior cerebral arteries occluded of the proximal A2 segment. Normal right ACA. Middle cerebral arteries: There is occlusion of the left middle cerebral artery inferior  division. Superior division and M1 segment are normal. Right MCA is normal. Posterior communicating arteries: Present bilaterally. Posterior cerebral arteries: Bilateral fetal origins.  Normal. Basilar artery: Normal. Vertebral arteries: Left dominant. Distal right vertebral artery appears occluded or severely stenotic. Superior cerebellar arteries: Normal. Anterior inferior cerebellar arteries: Normal. Posterior inferior cerebellar arteries: Nonvisualization of right PICA. Normal left. IMPRESSION: 1. 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 No acute hemorrhage or herniation. 2. Multiple small foci of acute ischemia within the left basal ganglia, left thalamus, right occipital lobe, right temporal lobe, right midbrain and bilaterally within the cerebellum. 3. 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 4. Occlusion of the proximal to midportion of the left anterior cerebral artery A2 segment. Electronically Signed   By: Deatra Robinson M.D.   On: 12/18/2017 17:49   Mr Brain Wo Contrast  Result Date: 12/18/2017 CLINICAL DATA:  Altered mental status EXAM: MRI HEAD WITHOUT CONTRAST MRA HEAD WITHOUT CONTRAST TECHNIQUE: Multiplanar, multiecho pulse sequences of the brain and  surrounding structures were obtained without intravenous contrast. Angiographic images of the head were obtained using MRA technique without contrast. COMPARISON:  None. FINDINGS: MRI HEAD FINDINGS Brain: Multifocal abnormal diffusion restriction. The largest areas involve the left anterior and middle cerebral artery territories. There are smaller infarct scattered within the left deep gray structures, right occipital lobe, right temporal lobe, right midbrain and both cerebellar hemispheres. Extensive hyperintense T2-weighted signal corresponds to the areas of ischemia. There is no midline shift. No acute hemorrhage. No mass lesion. No chronic microhemorrhage or cerebral amyloid angiopathy. No hydrocephalus, age advanced atrophy or lobar predominant volume loss. No dural abnormality or extra-axial collection. Skull and upper cervical spine: The visualized skull base, calvarium, upper cervical spine and extracranial soft tissues are normal. Sinuses/Orbits: Moderate left maxillary sinus mucosal disease. Small amount of bilateral mastoid fluid. Normal orbits. MRA HEAD FINDINGS Intracranial internal carotid arteries: Normal. Anterior cerebral arteries: The left anterior cerebral arteries occluded of the proximal A2 segment. Normal right ACA. Middle cerebral arteries: There is occlusion of the left middle cerebral artery inferior division. Superior division and M1 segment are normal. Right MCA is normal. Posterior communicating arteries: Present bilaterally. Posterior cerebral arteries: Bilateral fetal origins.  Normal. Basilar artery: Normal. Vertebral arteries: Left dominant. Distal right vertebral artery appears occluded or severely stenotic. Superior cerebellar arteries: Normal. Anterior inferior cerebellar arteries: Normal. Posterior inferior cerebellar arteries: Nonvisualization of right PICA. Normal left. IMPRESSION: 1. Large territory acute infarcts of the left anterior cerebral artery and left middle cerebral  artery inferior division. No acute hemorrhage or herniation. 2. Multiple small foci of acute ischemia within the left basal ganglia, left thalamus, right occipital lobe, right temporal lobe, right midbrain and bilaterally within the cerebellum. 3. 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 4. Occlusion of the proximal to midportion of the left anterior cerebral artery A2 segment. Electronically Signed   By: Deatra Robinson M.D.   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 Electronically Signed   By: Sebastian Ache M.D.   On: 12/18/2017 12:32   Result Date: 12/18/2017 CLINICAL DATA:  Found down on floor unresponsive. Prevertebral swelling on CT. EXAM: MRI CERVICAL SPINE WITHOUT CONTRAST TECHNIQUE: Multiplanar,  multisequence MR imaging of the cervical spine was performed. No intravenous contrast was administered. COMPARISON:  Cervical spine CT 12/17/2017 FINDINGS: Alignment: Mild reversal the normal cervical lordosis. Trace retrolisthesis of C2 on C3, C5 on C6, and C6 on C7. Vertebrae: T1 superior endplate Schmorl's node without marrow edema. Advanced disc degeneration at C5-6 and C6-7 with T2/stir hyperintensity diffusely throughout both disc spaces and mild endplate edema. Cord: Normal signal. Posterior Fossa, vertebral arteries, paraspinal tissues: Prevertebral fluid extends from C2-C7 and measures up to 1 cm in AP thickness at the C3-4 level. Edema in the more superficial soft tissues of the left lateral neck was more fully imaged on the earlier CT and may favor a posttraumatic etiology for the prevertebral fluid. No discrete anterior longitudinal ligament disruption is identified. There is no soft tissue edema suggestive of posterior ligamentous complex injury.  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 Preserved vertebral artery flow voids in the neck. Abnormal appearance of the right V4 segment. 2.9 cm T2 hyperintense subcutaneous lesion in the posterior right lower neck, likely a sebaceous cyst. Disc levels: C2-3: Severe disc space narrowing. Disc bulging, uncovertebral spurring, infolding of the ligamentum flavum, and moderate right facet arthrosis result in moderate spinal stenosis and mild-to-moderate right and severe left neural foraminal stenosis. C3-4: Small to moderate-sized central disc extrusion with mild superior migration and infolding of the ligamentum flavum result in moderate spinal stenosis. No significant neural foraminal stenosis. C4-5: Broad posterior disc protrusion results in mild spinal stenosis. Mild bilateral neural foraminal stenosis. C5-6: Severe disc space narrowing. Broad-based posterior disc osteophyte complex results in mild-to-moderate spinal stenosis and severe bilateral neural foraminal stenosis. C6-7: Severe disc space narrowing. Broad-based posterior disc osteophyte complex results in mild-to-moderate spinal stenosis and severe bilateral neural foraminal stenosis. C7-T1: Minimal disc bulging and mild facet arthrosis without stenosis. IMPRESSION: 1. Diffuse cervical prevertebral fluid/edema. This could reflect posttraumatic effusion and soft tissue injury versus early infectious discitis at C5-6 and C6-7. 2. 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 Electronically Signed: By: Sebastian Ache M.D. On: 12/18/2017 11:44   Dg Chest Port 1 View  Result Date: 12/22/2017 CLINICAL DATA:  72 year old male with fever.  Follow-up radiograph. EXAM: PORTABLE CHEST 1 VIEW COMPARISON:  Chest radiograph dated 12/20/2017 FINDINGS: Enteric tube extending into the distal stomach with tip beyond the inferior margin of the  image. There is shallow inspiration. No focal consolidation, pleural effusion, or pneumothorax. The cardiac silhouette is within normal limits. No acute osseous pathology. IMPRESSION: 1. No acute cardiopulmonary process. 2. Enteric tube in similar position with tip beyond the inferior margin of the image. Electronically Signed   By: Elgie Collard M.D.   On: 12/22/2017 04:07   Dg Chest Port 1 View  Result Date: 12/20/2017 CLINICAL DATA:  Aspiration pneumonia, found unresponsive 3 days ago EXAM: PORTABLE CHEST 1 VIEW COMPARISON:  12/18/2017 FINDINGS: Persistent low lung volumes with minor basilar atelectasis. No definite pneumonia, collapse or consolidation. Negative for edema, effusion or pneumothorax. Exam is rotated to the left. Aorta is atherosclerotic. No acute osseous finding. IMPRESSION: Persistent low lung volumes with basilar atelectasis. Electronically Signed   By: Judie Petit.  Shick M.D.   On: 12/20/2017 10:20   Portable Chest X-ray 1 View  Result Date: 12/18/2017 CLINICAL DATA:  Sepsis, AFib EXAM: PORTABLE CHEST 1 VIEW COMPARISON:  12/17/2017 FINDINGS: Mild lingular and bilateral lower lobe opacities, likely atelectasis, although pneumonia remains  possible. No frank interstitial edema.  No pleural effusion or pneumothorax. The heart is normal in size. IMPRESSION: Mild lingular and bilateral lower lobe opacities, likely atelectasis, although pneumonia remains possible. Electronically Signed   By: Charline Bills M.D.   On: 12/18/2017 07:35   Dg Chest Port 1 View  Result Date: 12/17/2017 CLINICAL DATA:  Chest pain, altered mental status EXAM: PORTABLE CHEST 1 VIEW COMPARISON:  Chest x-ray of 05/18/2017 FINDINGS: No active infiltrate or effusion is seen. Mediastinal and hilar contours are unremarkable. The heart is borderline enlarged and stable. No bony abnormality is seen. IMPRESSION: No active lung disease.  Heart upper normal in size. Electronically Signed   By: Dwyane Dee M.D.   On: 12/17/2017  11:56   Dg Chest Port 1v Same Day  Result Date: 12/20/2017 CLINICAL DATA:  NG tube placement. EXAM: PORTABLE CHEST 1 VIEW COMPARISON:  12/20/2017. FINDINGS: Normal heart size. Clear lung fields. No bony abnormality. Nasogastric tube has passed through the stomach, with its tip in the second duodenum. IMPRESSION: Enteric placement of nasogastric tube as described. No active cardiopulmonary disease. Electronically Signed   By: Elsie Stain M.D.   On: 12/20/2017 19:24   Dg Chest Port 1v Same Day  Result Date: 12/17/2017 CLINICAL DATA:  72 year old male with code sepsis, and evolving lung sounds EXAM: PORTABLE CHEST 1 VIEW COMPARISON:  Chest x-ray obtained earlier today at 11:40 a.m. FINDINGS: Significantly lower inspiratory volumes with developing lingular and left lower lobe streaky airspace opacities favored to reflect atelectasis. No overt pulmonary edema, pneumothorax or pleural effusion. Stable cardiac and mediastinal contours. No acute osseous abnormality. IMPRESSION: 1. Lower inspiratory volumes with developing streaky lingular and left lower lobe airspace opacities favored to reflect atelectasis. Electronically Signed   By: Malachy Moan M.D.   On: 12/17/2017 18:08   Ct Maxillofacial Wo Cm  Result Date: 12/17/2017 CLINICAL DATA:  Found down in his apartment. Laceration to left cheek. EXAM: CT HEAD WITHOUT CONTRAST CT MAXILLOFACIAL WITHOUT CONTRAST CT CERVICAL SPINE WITHOUT CONTRAST TECHNIQUE: Multidetector CT imaging of the head, cervical spine, and maxillofacial structures were performed using the standard protocol without intravenous contrast. Multiplanar CT image reconstructions of the cervical spine and maxillofacial structures were also generated. COMPARISON:  Cervical spine x-rays dated August 03, 2008. 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 No evidence of hemorrhage, hydrocephalus, extra-axial collection or mass  lesion/mass effect. Mild to moderate generalized cerebral atrophy. Mild scattered periventricular and subcortical white matter hypodensities are nonspecific, but favored to reflect chronic microvascular ischemic changes. Vascular: Atherosclerotic vascular calcification of the carotid siphons. No hyperdense vessel. Skull: Normal. Negative for fracture or focal lesion. Other: Small left frontal scalp hematoma. CT MAXILLOFACIAL FINDINGS Osseous: No fracture or mandibular dislocation. No destructive process. Periapical lucencies involving the right maxillary first and second molars. Orbits: Negative. No traumatic or inflammatory finding. Sinuses: Chronic appearing mild-to-moderate moderate mucosal thickening of the paranasal sinuses, moderate in the left maxillary sinus. The mastoid air cells are clear. Soft tissues: Left cheek laceration. CT CERVICAL SPINE FINDINGS Alignment: Reversal of the normal cervical lordosis, centered at C5, unchanged. No traumatic malalignment. Skull base and vertebrae: No acute cervical spine fracture. Mild anterior superior endplate height loss at T1 with associated Schmorl's node. No primary bone lesion or focal pathologic process. Soft tissues and spinal canal: Mild prevertebral soft tissue thickening. Subcutaneous stranding and fluid in the left neck. No visible canal hematoma. Disc levels: Moderate to severe disc height  loss at C2-C3, C5-C6, and C6-C7, progressed when compared to prior study. Mild bilateral neuroforaminal stenosis at C5-C6 and C6-C7. Upper chest: Negative. Other: 2.9 cm superficial subcutaneous cystic lesion in the right posterior neck, likely a sebaceous cyst. IMPRESSION: 1. 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 2. No acute cervical spine fracture. Mild prevertebral soft tissue thickening and subcutaneous stranding/fluid in the left neck. Ligamentous and/or soft tissue injury is not excluded. Recommend MRI cervical spine for  further evaluation. 3. Mild anterior superior endplate height loss at T1 with associated Schmorl's node is favored chronic. 4.  No acute facial fracture. 5. Small left frontal scalp hematoma.  Left cheek laceration. Electronically Signed   By: Obie Dredge M.D.   On: 12/17/2017 12:06         Discharge Exam: Vitals:   12/23/17 2047 12/24/17 0511  BP: 94/69 105/69  Pulse: (!) 123 (!) 117  Resp: 16 15  Temp: 98.4 F (36.9 C) 98.5 F (36.9 C)  SpO2: 90% 99%   Vitals:   12/23/17 1430 12/23/17 1600 12/23/17 2047 12/24/17 0511  BP:   94/69 105/69  Pulse: (!) 106  (!) 123 (!) 117  Resp: 15  16 15   Temp:  98.5 F (36.9 C) 98.4 F (36.9 C) 98.5 F (36.9 C)  TempSrc:  Axillary Axillary Oral  SpO2: 95%  90% 99%  Weight:      Height:        General: Pt is comfortable, not in acute distress Cardiovascular: RRR, S1/S2 +, no rubs, no gallops Respiratory: Bilateral scattered breast pain no wheezing.   Abdominal: Soft,ND,  bowel sounds + Extremities: no edema, no cyanosis   The results of significant diagnostics from this hospitalization (including imaging, microbiology, ancillary and laboratory) are listed below for reference.    Significant Diagnostic Studies: Ct Head Wo Contrast  Result Date: 12/17/2017 CLINICAL DATA:  Found down in his apartment. Laceration to left cheek. EXAM: CT HEAD WITHOUT CONTRAST CT MAXILLOFACIAL WITHOUT CONTRAST CT CERVICAL SPINE WITHOUT CONTRAST TECHNIQUE: Multidetector CT imaging of the head, cervical spine, and maxillofacial structures were performed using the standard protocol without intravenous contrast. Multiplanar CT image reconstructions of the cervical spine and maxillofacial structures were also generated. COMPARISON:  Cervical spine x-rays dated August 03, 2008. 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 No evidence of hemorrhage, hydrocephalus, extra-axial collection or mass  lesion/mass effect. Mild to moderate generalized cerebral atrophy. Mild scattered periventricular and subcortical white matter hypodensities are nonspecific, but favored to reflect chronic microvascular ischemic changes. Vascular: Atherosclerotic vascular calcification of the carotid siphons. No hyperdense vessel. Skull: Normal. Negative for fracture or focal lesion. Other: Small left frontal scalp hematoma. CT MAXILLOFACIAL FINDINGS Osseous: No fracture or mandibular dislocation. No destructive process. Periapical lucencies involving the right maxillary first and second molars. Orbits: Negative. No traumatic or inflammatory finding. Sinuses: Chronic appearing mild-to-moderate moderate mucosal thickening of the paranasal sinuses, moderate in the left maxillary sinus. The mastoid air cells are clear. Soft tissues: Left cheek laceration. CT CERVICAL SPINE FINDINGS Alignment: Reversal of the normal cervical lordosis, centered at C5, unchanged. No traumatic malalignment. Skull base and vertebrae: No acute cervical spine fracture. Mild anterior superior endplate height loss at T1 with associated Schmorl's node. No primary bone lesion or focal pathologic process. Soft tissues and spinal canal: Mild prevertebral soft tissue thickening. Subcutaneous stranding and fluid in the left neck. No visible canal hematoma. Disc levels: Moderate to severe  disc height loss at C2-C3, C5-C6, and C6-C7, progressed when compared to prior study. Mild bilateral neuroforaminal stenosis at C5-C6 and C6-C7. Upper chest: Negative. Other: 2.9 cm superficial subcutaneous cystic lesion in the right posterior neck, likely a sebaceous cyst. IMPRESSION: 1. 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 2. No acute cervical spine fracture. Mild prevertebral soft tissue thickening and subcutaneous stranding/fluid in the left neck. Ligamentous and/or soft tissue injury is not excluded. Recommend MRI cervical spine for  further evaluation. 3. Mild anterior superior endplate height loss at T1 with associated Schmorl's node is favored chronic. 4.  No acute facial fracture. 5. Small left frontal scalp hematoma.  Left cheek laceration. Electronically Signed   By: Obie Dredge M.D.   On: 12/17/2017 12:06   Ct Cervical Spine Wo Contrast  Result Date: 12/17/2017 CLINICAL DATA:  Found down in his apartment. Laceration to left cheek. EXAM: CT HEAD WITHOUT CONTRAST CT MAXILLOFACIAL WITHOUT CONTRAST CT CERVICAL SPINE WITHOUT CONTRAST TECHNIQUE: Multidetector CT imaging of the head, cervical spine, and maxillofacial structures were performed using the standard protocol without intravenous contrast. Multiplanar CT image reconstructions of the cervical spine and maxillofacial structures were also generated. COMPARISON:  Cervical spine x-rays dated August 03, 2008. 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 No evidence of hemorrhage, hydrocephalus, extra-axial collection or mass lesion/mass effect. Mild to moderate generalized cerebral atrophy. Mild scattered periventricular and subcortical white matter hypodensities are nonspecific, but favored to reflect chronic microvascular ischemic changes. Vascular: Atherosclerotic vascular calcification of the carotid siphons. No hyperdense vessel. Skull: Normal. Negative for fracture or focal lesion. Other: Small left frontal scalp hematoma. CT MAXILLOFACIAL FINDINGS Osseous: No fracture or mandibular dislocation. No destructive process. Periapical lucencies involving the right maxillary first and second molars. Orbits: Negative. No traumatic or inflammatory finding. Sinuses: Chronic appearing mild-to-moderate moderate mucosal thickening of the paranasal sinuses, moderate in the left maxillary sinus. The mastoid air cells are clear. Soft tissues: Left cheek laceration. CT CERVICAL SPINE FINDINGS Alignment: Reversal of the normal  cervical lordosis, centered at C5, unchanged. No traumatic malalignment. Skull base and vertebrae: No acute cervical spine fracture. Mild anterior superior endplate height loss at T1 with associated Schmorl's node. No primary bone lesion or focal pathologic process. Soft tissues and spinal canal: Mild prevertebral soft tissue thickening. Subcutaneous stranding and fluid in the left neck. No visible canal hematoma. Disc levels: Moderate to severe disc height loss at C2-C3, C5-C6, and C6-C7, progressed when compared to prior study. Mild bilateral neuroforaminal stenosis at C5-C6 and C6-C7. Upper chest: Negative. Other: 2.9 cm superficial subcutaneous cystic lesion in the right posterior neck, likely a sebaceous cyst. IMPRESSION: 1. 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 2. No acute cervical spine fracture. Mild prevertebral soft tissue thickening and subcutaneous stranding/fluid in the left neck. Ligamentous and/or soft tissue injury is not excluded. Recommend MRI cervical spine for further evaluation. 3. Mild anterior superior endplate height loss at T1 with associated Schmorl's node is favored chronic. 4.  No acute facial fracture. 5. Small left frontal scalp hematoma.  Left cheek laceration. Electronically Signed   By: Obie Dredge M.D.   On: 12/17/2017 12:06   Mr Maxine Glenn Head Wo Contrast  Result Date: 12/18/2017 CLINICAL DATA:  Altered mental status EXAM: MRI HEAD WITHOUT CONTRAST MRA HEAD WITHOUT CONTRAST TECHNIQUE: Multiplanar, multiecho pulse sequences of the brain and surrounding structures were obtained without intravenous contrast. Angiographic images of the head  were obtained using MRA technique without contrast. COMPARISON:  None. FINDINGS: MRI HEAD FINDINGS Brain: Multifocal abnormal diffusion restriction. The largest areas involve the left anterior and middle cerebral artery territories. There are smaller infarct scattered within the left deep gray structures,  right occipital lobe, right temporal lobe, right midbrain and both cerebellar hemispheres. Extensive hyperintense T2-weighted signal corresponds to the areas of ischemia. There is no midline shift. No acute hemorrhage. No mass lesion. No chronic microhemorrhage or cerebral amyloid angiopathy. No hydrocephalus, age advanced atrophy or lobar predominant volume loss. No dural abnormality or extra-axial collection. Skull and upper cervical spine: The visualized skull base, calvarium, upper cervical spine and extracranial soft tissues are normal. Sinuses/Orbits: Moderate left maxillary sinus mucosal disease. Small amount of bilateral mastoid fluid. Normal orbits. MRA HEAD FINDINGS Intracranial internal carotid arteries: Normal. Anterior cerebral arteries: The left anterior cerebral arteries occluded of the proximal A2 segment. Normal right ACA. Middle cerebral arteries: There is occlusion of the left middle cerebral artery inferior division. Superior division and M1 segment are normal. Right MCA is normal. Posterior communicating arteries: Present bilaterally. Posterior cerebral arteries: Bilateral fetal origins.  Normal. Basilar artery: Normal. Vertebral arteries: Left dominant. Distal right vertebral artery appears occluded or severely stenotic. Superior cerebellar arteries: Normal. Anterior inferior cerebellar arteries: Normal. Posterior inferior cerebellar arteries: Nonvisualization of right PICA. Normal left. IMPRESSION: 1. 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 No acute hemorrhage or herniation. 2. Multiple small foci of acute ischemia within the left basal ganglia, left thalamus, right occipital lobe, right temporal lobe, right midbrain and bilaterally within the cerebellum. 3. 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 4. Occlusion of the proximal to midportion of the left anterior cerebral artery A2  segment. Electronically Signed   By: Deatra Robinson M.D.   On: 12/18/2017 17:49   Mr Brain Wo Contrast  Result Date: 12/18/2017 CLINICAL DATA:  Altered mental status EXAM: MRI HEAD WITHOUT CONTRAST MRA HEAD WITHOUT CONTRAST TECHNIQUE: Multiplanar, multiecho pulse sequences of the brain and surrounding structures were obtained without intravenous contrast. Angiographic images of the head were obtained using MRA technique without contrast. COMPARISON:  None. FINDINGS: MRI HEAD FINDINGS Brain: Multifocal abnormal diffusion restriction. The largest areas involve the left anterior and middle cerebral artery territories. There are smaller infarct scattered within the left deep gray structures, right occipital lobe, right temporal lobe, right midbrain and both cerebellar hemispheres. Extensive hyperintense T2-weighted signal corresponds to the areas of ischemia. There is no midline shift. No acute hemorrhage. No mass lesion. No chronic microhemorrhage or cerebral amyloid angiopathy. No hydrocephalus, age advanced atrophy or lobar predominant volume loss. No dural abnormality or extra-axial collection. Skull and upper cervical spine: The visualized skull base, calvarium, upper cervical spine and extracranial soft tissues are normal. Sinuses/Orbits: Moderate left maxillary sinus mucosal disease. Small amount of bilateral mastoid fluid. Normal orbits. MRA HEAD FINDINGS Intracranial internal carotid arteries: Normal. Anterior cerebral arteries: The left anterior cerebral arteries occluded of the proximal A2 segment. Normal right ACA. Middle cerebral arteries: There is occlusion of the left middle cerebral artery inferior division. Superior division and M1 segment are normal. Right MCA is normal. Posterior communicating arteries: Present bilaterally. Posterior cerebral arteries: Bilateral fetal origins.  Normal. Basilar artery: Normal. Vertebral arteries: Left dominant. Distal right vertebral artery appears occluded or  severely stenotic. Superior cerebellar arteries: Normal. Anterior inferior cerebellar arteries: Normal. Posterior inferior cerebellar arteries: Nonvisualization of right PICA. Normal  left. IMPRESSION: 1. 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 No acute hemorrhage or herniation. 2. Multiple small foci of acute ischemia within the left basal ganglia, left thalamus, right occipital lobe, right temporal lobe, right midbrain and bilaterally within the cerebellum. 3. 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 4. Occlusion of the proximal to midportion of the left anterior cerebral artery A2 segment. Electronically Signed   By: Deatra Robinson M.D.   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 Electronically Signed   By: Sebastian Ache M.D.   On: 12/18/2017 12:32   Result Date: 12/18/2017 CLINICAL DATA:  Found down on floor unresponsive. Prevertebral swelling on CT. EXAM: MRI CERVICAL SPINE WITHOUT CONTRAST TECHNIQUE: Multiplanar, multisequence MR imaging of the cervical spine was performed. No intravenous contrast was administered. COMPARISON:  Cervical spine CT 12/17/2017 FINDINGS: Alignment: Mild reversal the normal cervical lordosis. Trace retrolisthesis of C2 on C3, C5 on C6, and C6 on C7. Vertebrae: T1 superior endplate Schmorl's node without marrow edema. Advanced disc degeneration at C5-6 and C6-7 with T2/stir hyperintensity diffusely throughout both disc spaces and mild endplate edema. Cord: Normal signal. Posterior Fossa, vertebral arteries, paraspinal tissues: Prevertebral fluid extends from C2-C7 and measures up to 1 cm in AP thickness at the C3-4 level. Edema in the more superficial  soft tissues of the left lateral neck was more fully imaged on the earlier CT and may favor a posttraumatic etiology for the prevertebral fluid. No discrete anterior longitudinal ligament disruption is identified. There is no soft tissue edema suggestive of posterior ligamentous complex injury. 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 Preserved vertebral artery flow voids in the neck. Abnormal appearance of the right V4 segment. 2.9 cm T2 hyperintense subcutaneous lesion in the posterior right lower neck, likely a sebaceous cyst. Disc levels: C2-3: Severe disc space narrowing. Disc bulging, uncovertebral spurring, infolding of the ligamentum flavum, and moderate right facet arthrosis result in moderate spinal stenosis and mild-to-moderate right and severe left neural foraminal stenosis. C3-4: Small to moderate-sized central disc extrusion with mild superior migration and infolding of the ligamentum flavum result in moderate spinal stenosis. No significant neural foraminal stenosis. C4-5: Broad posterior disc protrusion results in mild spinal stenosis. Mild bilateral neural foraminal stenosis. C5-6: Severe disc space narrowing. Broad-based posterior disc osteophyte complex results in mild-to-moderate spinal stenosis and severe bilateral neural foraminal stenosis. C6-7: Severe disc space narrowing. Broad-based posterior disc osteophyte complex results in mild-to-moderate spinal stenosis and severe bilateral neural foraminal stenosis. C7-T1: Minimal disc bulging and mild facet arthrosis without stenosis. IMPRESSION: 1. Diffuse cervical prevertebral fluid/edema. This could reflect posttraumatic effusion and soft tissue injury versus early infectious discitis at C5-6 and C6-7. 2. 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 Electronically Signed: By: Sebastian Ache M.D. On: 12/18/2017 11:44    Dg Chest Port 1 View  Result Date: 12/22/2017 CLINICAL DATA:  72 year old male with fever.  Follow-up radiograph. EXAM: PORTABLE CHEST 1 VIEW COMPARISON:  Chest radiograph dated 12/20/2017 FINDINGS: Enteric tube extending into the distal stomach with tip beyond the inferior margin of the image. There is shallow inspiration. No focal consolidation, pleural  effusion, or pneumothorax. The cardiac silhouette is within normal limits. No acute osseous pathology. IMPRESSION: 1. No acute cardiopulmonary process. 2. Enteric tube in similar position with tip beyond the inferior margin of the image. Electronically Signed   By: Elgie Collard M.D.   On: 12/22/2017 04:07   Dg Chest Port 1 View  Result Date: 12/20/2017 CLINICAL DATA:  Aspiration pneumonia, found unresponsive 3 days ago EXAM: PORTABLE CHEST 1 VIEW COMPARISON:  12/18/2017 FINDINGS: Persistent low lung volumes with minor basilar atelectasis. No definite pneumonia, collapse or consolidation. Negative for edema, effusion or pneumothorax. Exam is rotated to the left. Aorta is atherosclerotic. No acute osseous finding. IMPRESSION: Persistent low lung volumes with basilar atelectasis. Electronically Signed   By: Judie Petit.  Shick M.D.   On: 12/20/2017 10:20   Portable Chest X-ray 1 View  Result Date: 12/18/2017 CLINICAL DATA:  Sepsis, AFib EXAM: PORTABLE CHEST 1 VIEW COMPARISON:  12/17/2017 FINDINGS: Mild lingular and bilateral lower lobe opacities, likely atelectasis, although pneumonia remains possible. No frank interstitial edema.  No pleural effusion or pneumothorax. The heart is normal in size. IMPRESSION: Mild lingular and bilateral lower lobe opacities, likely atelectasis, although pneumonia remains possible. Electronically Signed   By: Charline Bills M.D.   On: 12/18/2017 07:35   Dg Chest Port 1 View  Result Date: 12/17/2017 CLINICAL DATA:  Chest pain, altered mental status EXAM: PORTABLE CHEST 1 VIEW COMPARISON:  Chest x-ray of 05/18/2017 FINDINGS:  No active infiltrate or effusion is seen. Mediastinal and hilar contours are unremarkable. The heart is borderline enlarged and stable. No bony abnormality is seen. IMPRESSION: No active lung disease.  Heart upper normal in size. Electronically Signed   By: Dwyane Dee M.D.   On: 12/17/2017 11:56   Dg Chest Port 1v Same Day  Result Date: 12/20/2017 CLINICAL DATA:  NG tube placement. EXAM: PORTABLE CHEST 1 VIEW COMPARISON:  12/20/2017. FINDINGS: Normal heart size. Clear lung fields. No bony abnormality. Nasogastric tube has passed through the stomach, with its tip in the second duodenum. IMPRESSION: Enteric placement of nasogastric tube as described. No active cardiopulmonary disease. Electronically Signed   By: Elsie Stain M.D.   On: 12/20/2017 19:24   Dg Chest Port 1v Same Day  Result Date: 12/17/2017 CLINICAL DATA:  72 year old male with code sepsis, and evolving lung sounds EXAM: PORTABLE CHEST 1 VIEW COMPARISON:  Chest x-ray obtained earlier today at 11:40 a.m. FINDINGS: Significantly lower inspiratory volumes with developing lingular and left lower lobe streaky airspace opacities favored to reflect atelectasis. No overt pulmonary edema, pneumothorax or pleural effusion. Stable cardiac and mediastinal contours. No acute osseous abnormality. IMPRESSION: 1. Lower inspiratory volumes with developing streaky lingular and left lower lobe airspace opacities favored to reflect atelectasis. Electronically Signed   By: Malachy Moan M.D.   On: 12/17/2017 18:08   Ct Maxillofacial Wo Cm  Result Date: 12/17/2017 CLINICAL DATA:  Found down in his apartment. Laceration to left cheek. EXAM: CT HEAD WITHOUT CONTRAST CT MAXILLOFACIAL WITHOUT CONTRAST CT CERVICAL SPINE WITHOUT CONTRAST TECHNIQUE: Multidetector CT imaging of the head, cervical spine, and maxillofacial structures were performed using the standard protocol without intravenous contrast. Multiplanar CT image reconstructions of the cervical spine and  maxillofacial structures were also generated. COMPARISON:  Cervical spine x-rays dated August 03, 2008. 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 No evidence of hemorrhage, hydrocephalus, extra-axial collection or mass lesion/mass effect. Mild to moderate generalized cerebral atrophy. Mild scattered periventricular and subcortical  white matter hypodensities are nonspecific, but favored to reflect chronic microvascular ischemic changes. Vascular: Atherosclerotic vascular calcification of the carotid siphons. No hyperdense vessel. Skull: Normal. Negative for fracture or focal lesion. Other: Small left frontal scalp hematoma. CT MAXILLOFACIAL FINDINGS Osseous: No fracture or mandibular dislocation. No destructive process. Periapical lucencies involving the right maxillary first and second molars. Orbits: Negative. No traumatic or inflammatory finding. Sinuses: Chronic appearing mild-to-moderate moderate mucosal thickening of the paranasal sinuses, moderate in the left maxillary sinus. The mastoid air cells are clear. Soft tissues: Left cheek laceration. CT CERVICAL SPINE FINDINGS Alignment: Reversal of the normal cervical lordosis, centered at C5, unchanged. No traumatic malalignment. Skull base and vertebrae: No acute cervical spine fracture. Mild anterior superior endplate height loss at T1 with associated Schmorl's node. No primary bone lesion or focal pathologic process. Soft tissues and spinal canal: Mild prevertebral soft tissue thickening. Subcutaneous stranding and fluid in the left neck. No visible canal hematoma. Disc levels: Moderate to severe disc height loss at C2-C3, C5-C6, and C6-C7, progressed when compared to prior study. Mild bilateral neuroforaminal stenosis at C5-C6 and C6-C7. Upper chest: Negative. Other: 2.9 cm superficial subcutaneous cystic lesion in the right posterior neck, likely a sebaceous cyst. IMPRESSION: 1.  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 2. No acute cervical spine fracture. Mild prevertebral soft tissue thickening and subcutaneous stranding/fluid in the left neck. Ligamentous and/or soft tissue injury is not excluded. Recommend MRI cervical spine for further evaluation. 3. Mild anterior superior endplate height loss at T1 with associated Schmorl's node is favored chronic. 4.  No acute facial fracture. 5. Small left frontal scalp hematoma.  Left cheek laceration. Electronically Signed   By: Obie Dredge M.D.   On: 12/17/2017 12:06     Microbiology: Recent Results (from the past 240 hour(s))  Urine culture     Status: Abnormal   Collection Time: 12/17/17 11:05 AM  Result Value Ref Range Status   Specimen Description   Final    URINE, RANDOM Performed at [facility], [address], [place], [place] [address]    Special Requests   Final    NONE Performed at [facility], [address] [place], [place] [address]    Culture >=100,000 COLONIES/mL KLEBSIELLA OXYTOCA (A)  Final   Report Status 12/20/2017 FINAL  Final   Organism ID, Bacteria KLEBSIELLA OXYTOCA (A)  Final      Susceptibility   Klebsiella oxytoca - MIC*    AMPICILLIN >=32 RESISTANT Resistant     CEFAZOLIN >=64 RESISTANT Resistant     CEFTRIAXONE <=1 SENSITIVE Sensitive     CIPROFLOXACIN <=0.25 SENSITIVE Sensitive     GENTAMICIN <=1 SENSITIVE Sensitive     IMIPENEM 0.5 SENSITIVE Sensitive     NITROFURANTOIN 64 INTERMEDIATE Intermediate     TRIMETH/SULFA <=20 SENSITIVE Sensitive     AMPICILLIN/SULBACTAM >=32 RESISTANT Resistant     PIP/TAZO >=128 RESISTANT Resistant     Extended ESBL NEGATIVE Sensitive     * >=100,000 COLONIES/mL KLEBSIELLA OXYTOCA  Blood culture (routine x 2)     Status: None   Collection Time: 12/17/17  7:21 PM  Result Value Ref Range Status   Specimen Description BLOOD RIGHT ARM DRAWN BY RN  Final   Special Requests   Final    BOTTLES DRAWN AEROBIC AND  ANAEROBIC Blood Culture adequate volume   Culture   Final    NO GROWTH 5 DAYS Performed at [facility], [address], [place], [place]  [address]    Report Status 12/22/2017 FINAL  Final  Blood culture (routine x 2)     Status: None   Collection Time: 12/17/17  7:21 PM  Result Value Ref Range Status   Specimen Description BLOOD RIGHT WRIST DRAWN BY RN  Final   Special Requests   Final    BOTTLES DRAWN AEROBIC AND ANAEROBIC Blood Culture adequate volume   Culture   Final    NO GROWTH 5 DAYS Performed at [facility], [address] [place], [place] [address]    Report Status 12/22/2017 FINAL  Final  MRSA PCR Screening     Status: Abnormal   Collection Time: 12/17/17  8:08 PM  Result Value Ref Range Status   MRSA by PCR POSITIVE (A) NEGATIVE Final    Comment:        The GeneXpert MRSA Assay (FDA approved for NASAL specimens only), is one component of a comprehensive MRSA colonization surveillance program. It is not intended to diagnose MRSA infection nor to guide or monitor treatment for MRSA infections. RESULT CALLED TO, READ BACK BY AND VERIFIED WITH: AMBURN,A. AT 0253 ON 12/18/2017 BY EVA Performed at [facility], [address] [place], [place] [address]      Labs: Basic Metabolic Panel: Recent Labs  Lab 12/20/17 0439 12/21/17 0414 12/22/17 0342 12/22/17 1428 12/23/17 0451  NA 158* 163* 161* 156* 151*  K 3.9 3.8 4.3 3.9 3.6  CL 128* >130* 134* 128* 125*  CO2 21* 24 22 22  21*  GLUCOSE 309* 196* 269* 267* 320*  BUN 86* 67* 62* 65* 63*  CREATININE 1.96* 1.55* 1.59* 1.79* 1.62*  CALCIUM 8.6* 8.5* 8.1* 7.9* 7.7*  MG  --  3.0*  --   --   --   PHOS  --   --   --  3.2 3.5   Liver Function Tests: Recent Labs  Lab 12/18/17 0424 12/20/17 0439 12/22/17 0342 12/22/17 1428 12/23/17 0451  AST 71* 31 159*  --   --   ALT 23 22 82*  --   --   ALKPHOS 35* 38 58  --   --   BILITOT 3.8* 1.3* 1.4*  --   --   PROT 6.6 6.6 5.7*  --   --   ALBUMIN 2.2* 2.2* 1.9* 1.8*  1.8*   No results for input(s): LIPASE, AMYLASE in the last 168 hours. No results for input(s): AMMONIA in the last 168 hours. CBC: Recent Labs  Lab 12/18/17 0424 12/19/17 0421 12/20/17 0439 12/21/17 0414 12/22/17 0342  WBC 26.6* 28.2* 34.4* 26.1* 16.2*  NEUTROABS  --   --   --  24.0*  --   HGB 11.0* 10.8* 10.9* 10.6* 10.3*  HCT 32.8* 33.3* 32.9* 32.0* 31.9*  MCV 97.3 97.4 97.1 99.7 100.9*  PLT 38* 42* 30* 26* 29*   Cardiac Enzymes: Recent Labs  Lab 12/17/17 1759 12/18/17 0047 12/18/17 0424 12/20/17 0439  CKTOTAL  --   --   --  145  TROPONINI 0.88* 0.65* 0.54*  --    BNP: Invalid input(s): POCBNP CBG: Recent Labs  Lab 12/22/17 1616 12/22/17 1939 12/23/17 0004 12/23/17 0352 12/23/17 0738  GLUCAP 209* 225* 258* 261* 262*    Time coordinating discharge:  36 minutes  Signed:  Catarina Hartshorn, DO [facility] Pager: [phone] 12/24/2017, 1:27 PM

## 2017-12-24 NOTE — Clinical Social Work Note (Addendum)
Pt/family requesting referral to [facility]. Referral sent. Awaiting determination and update on bed availability.   Per Cassandra, they do have a bed available for pt today. She is working on connecting with pt's daughter to discuss admission paperwork. Cassandra at Hospice will call LCSW, Tretha Sciara, when this is complete and she will let RN know pt can transfer.  Updated RN, Cordelia Pen, MD, and Herbert Seta.

## 2017-12-25 ENCOUNTER — Telehealth: Payer: Self-pay

## 2017-12-25 NOTE — Telephone Encounter (Signed)
Patient was on the [facility] call list, however, he was d/c'ed to hospice. NO TOC needed.

## 2018-01-02 NOTE — Progress Notes (Deleted)
Cardiology Office Note    Date:  01/02/2018   ID:  Juan Barber, DOB 1945/10/31, MRN [id number]  PCP:  Aliene Beams, MD  Cardiologist: Charlton Haws, MD    No chief complaint on file.   History of Present Illness:    72 y.o. non-ischemic DCM normal cors at cath October 2018 EF as low as 30-35% but improved on most recent echo done 12/18/17 40-45% Also has PAF on xarelto , HTN and HLD  Admitted May found down at home by neighbors. 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 Palliative care consulted and d/c home with residential hospice  ***   Past Medical History:  Diagnosis Date  . Atrial fibrillation (HCC)   . Nonischemic cardiomyopathy (HCC)    a. echo in 05/2017 showing reduced EF of 30-35% - cath showing normal cors  . Seizures (HCC)     Past Surgical History:  Procedure Laterality Date  . HERNIA REPAIR    . LEFT HEART CATH AND CORONARY ANGIOGRAPHY N/A 06/05/2017   Procedure: LEFT HEART CATH AND CORONARY ANGIOGRAPHY;  Surgeon: [place]az[place], Peter M, MD;  Location: [facility] INVASIVE CV LAB;  Service: Cardiovascular;  Laterality: N/A;    Current Medications: Outpatient Medications Prior to Visit  Medication Sig Dispense Refill  . Morphine Sulfate (MORPHINE CONCENTRATE) 10 MG/0.5ML SOLN concentrated solution Take 0.25 mLs (5 mg total) by mouth every 8 (eight) hours. 30 mL 0   No facility-administered medications prior to visit.      Allergies:   Other and Talwin [pentazocine]   Social History   Socioeconomic History  . Marital status: Divorced    Spouse name: Not on file  . Number of children: Not on file  . Years of education: Not on file  . Highest education level: Not on file  Occupational History  . Not on file  Social Needs  . Financial resource strain: Not on file  . Food insecurity:    Worry: Not on file    Inability: Not on file  . Transportation needs:    Medical: Not on file    Non-medical: Not on file    Tobacco Use  . Smoking status: Never Smoker  . Smokeless tobacco: Never Used  Substance and Sexual Activity  . Alcohol use: Yes    Comment: occassional  . Drug use: No  . Sexual activity: Not on file  Lifestyle  . Physical activity:    Days per week: Not on file    Minutes per session: Not on file  . Stress: Not on file  Relationships  . Social connections:    Talks on phone: Not on file    Gets together: Not on file    Attends religious service: Not on file    Active member of club or organization: Not on file    Attends meetings of clubs or organizations: Not on file    Relationship status: Not on file  Other Topics Concern  . Not on file  Social History Narrative  . Not on file     Family History:  The patient's family history includes Heart attack in his mother.   Review of Systems:   Please see the history of present illness.     General:  No chills, fever, night sweats or weight changes. Positive for back pain.  Cardiovascular:  No chest pain, dyspnea on exertion, edema, orthopnea, palpitations, paroxysmal nocturnal dyspnea. Dermatological: No rash, lesions/masses Respiratory: No cough, dyspnea  Urologic: No hematuria, dysuria Abdominal:   No nausea, vomiting, diarrhea, bright red blood per rectum, melena, or hematemesis Neurologic:  No visual changes, wkns, changes in mental status. All other systems reviewed and are otherwise negative except as noted above.   Physical Exam:    VS:  There were no vitals taken for this visit.   Affect appropriate Healthy:  appears stated age HEENT: normal Neck supple with no adenopathy JVP normal no bruits no thyromegaly Lungs clear with no wheezing and good diaphragmatic motion Heart:  S1/S2 no murmur, no rub, gallop or click PMI increased  Abdomen: benighn, BS positve, no tenderness, no AAA no bruit.  No HSM or HJR Distal pulses intact with no bruits No edema Neuro *** Skin warm and dry No muscular weakness  Wt  Readings from Last 3 Encounters:  12/23/17 170 lb 13.7 oz (77.5 kg)  10/08/17 175 lb (79.4 kg)  06/21/17 167 lb (75.8 kg)     Studies/Labs Reviewed:   EKG:   12/20/17 afib rate 114 nonspecific ST changes   Recent Labs: 12/17/2017: TSH 1.211 12/21/2017: Magnesium 3.0 12/22/2017: ALT 82; B Natriuretic Peptide 212.0; Hemoglobin 10.3; Platelets 29 12/23/2017: BUN 63; Creatinine, Ser 1.62; Potassium 3.6; Sodium 151   Lipid Panel No results found for: CHOL, TRIG, HDL, CHOLHDL, VLDL, LDLCALC, LDLDIRECT  Additional studies/ records that were reviewed today include:   Echocardiogram: 12/20/17 Study Conclusions  - Left ventricle: The cavity size was normal. Wall thickness was   normal. Systolic function was mildly to moderately reduced. The   estimated ejection fraction was in the range of 40% to 45%.   Doppler parameters are consistent with abnormal left ventricular   relaxation (grade 1 diastolic dysfunction). - Aortic valve: Mildly calcified annulus. Trileaflet; normal   thickness leaflets. Valve area (VTI): 3.01 cm^2. Valve area   (Vmax): 2.6 cm^2. - Mitral valve: Mildly calcified annulus. Normal thickness leaflets   . There was mild regurgitation. - Left atrium: The atrium was mildly dilated. - Limited echo to evaluate LV function. .  Cardiac Catheterization: 06/05/2017  LV end diastolic pressure is normal.   1. Normal coronary arteries 2. Normal LVEDP  Plan: will observe overnight in hospital since patient lives alone without support. DC in am. Should be able to proceed with wrist surgery.  Assessment:    No diagnosis found.   Plan:   In order of problems listed above:  1. CHF:  Non ischemic DCM continue medical Rx 2. PAF:  Was supposed to be on oral anticoagulation but not likely compliant now in residential Hospice with comfort care *** 3. CVA:  Likely embolic from PAF and non compliance with medicines    Charlton Haws .

## 2018-01-03 ENCOUNTER — Ambulatory Visit: Payer: Medicare Other | Admitting: Cardiovascular Disease

## 2018-01-12 DEATH — deceased

## 2018-01-17 ENCOUNTER — Encounter: Payer: Self-pay | Admitting: Family Medicine

## 2018-01-18 ENCOUNTER — Encounter: Payer: Self-pay | Admitting: Family Medicine

## 2019-01-05 IMAGING — CT CT HEAD W/O CM
4 of 13 series · 16 of 47 positions shown, 18 images · non-contrast
Comparison: Cervical spine x-rays dated August 03, 2008.

CLINICAL DATA: Found down in his apartment. Laceration to left
cheek.

EXAM:
CT HEAD WITHOUT CONTRAST
CT MAXILLOFACIAL WITHOUT CONTRAST
CT CERVICAL SPINE WITHOUT CONTRAST
TECHNIQUE: Multidetector CT imaging of the head, cervical spine, and
maxillofacial structures were performed using the standard protocol
without intravenous contrast. Multiplanar CT image reconstructions
of the cervical spine and maxillofacial structures were also
generated.

[Series 5: coronal soft tissue · coronal · 0.33mm/px · 1 of 75 slices shown]
[im 38/75  brain]
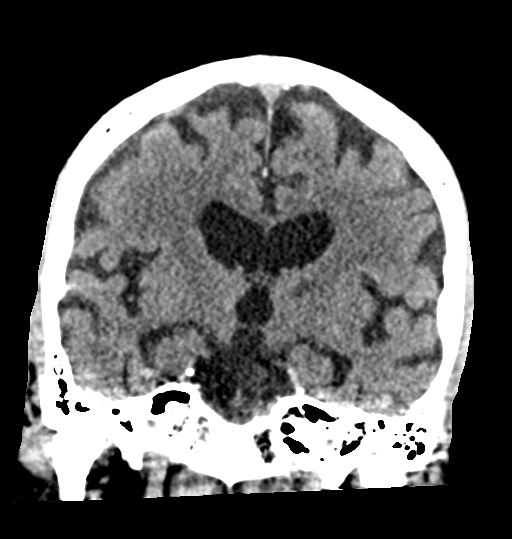

[Series 7: max soft · axial · 0.34mm/px · z∈[-740,-612]mm · 6 of 90 slices shown]
[im 13/90  brain]
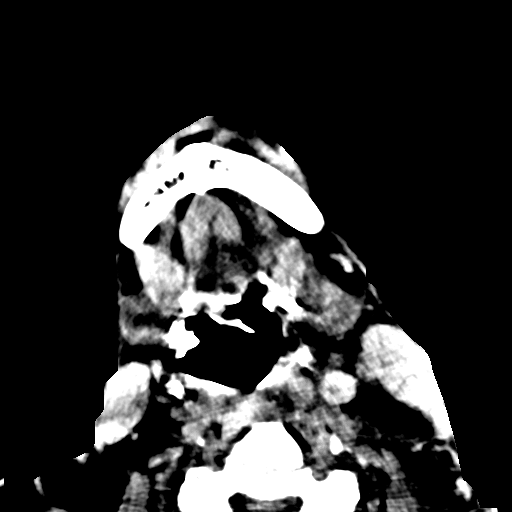
[im 26/90  brain]
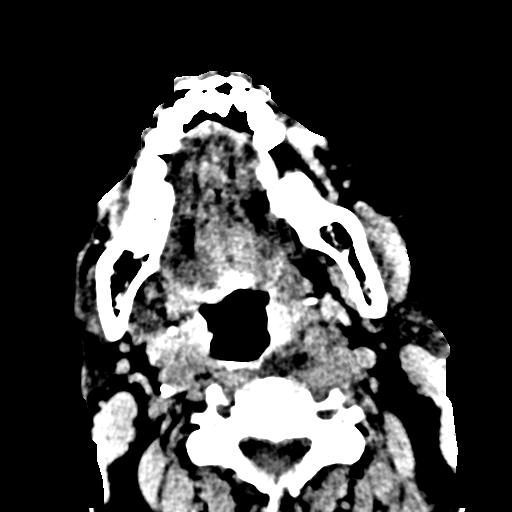
[im 39/90  brain]
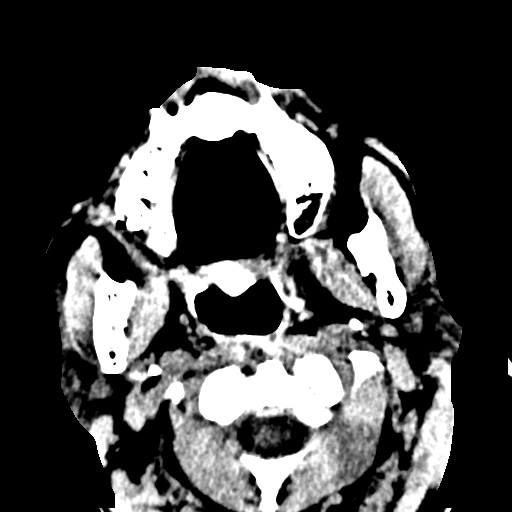
[im 51/90  brain]
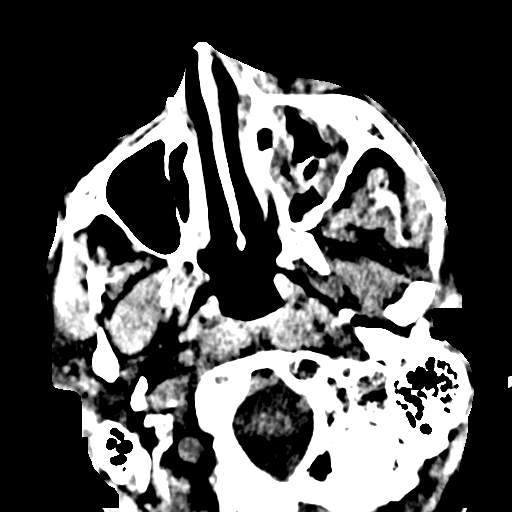
[im 64/90  brain]
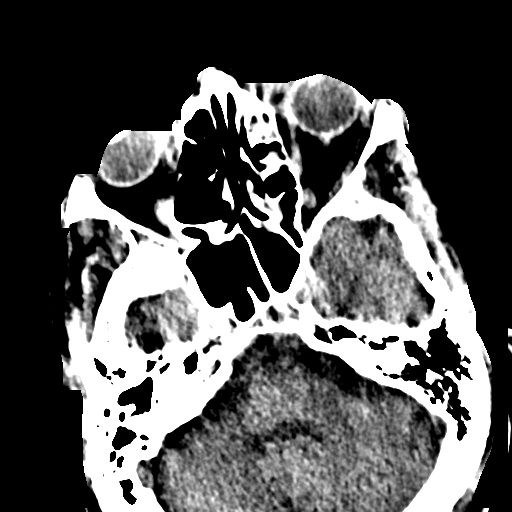
[im 77/90  brain]
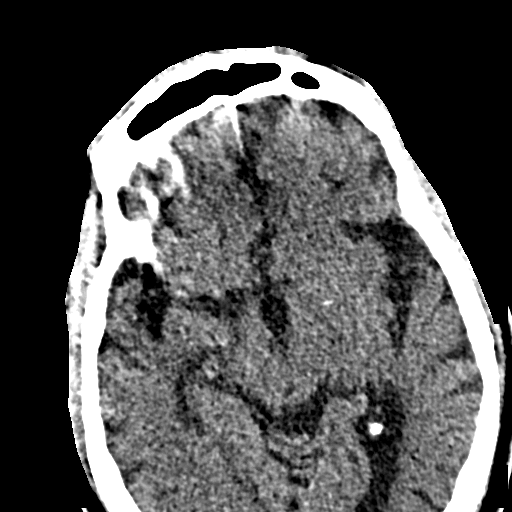

[Series 24: c spine soft · axial · 0.31mm/px · z∈[-814,-790]mm · 2 of 100 slices shown]
[im 13/100  brain]
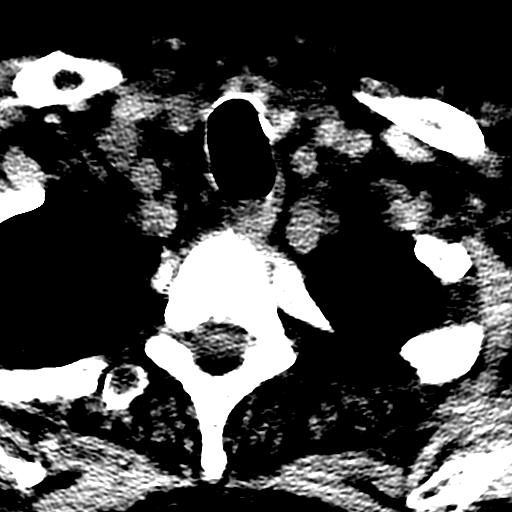
[im 25/100  brain]
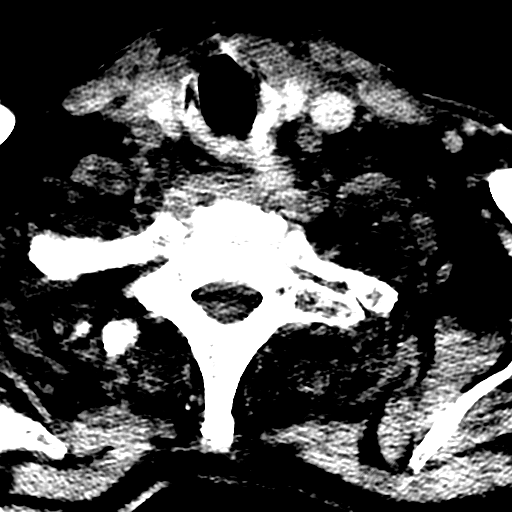

[Series 28: orthogonal axials · axial · 0.21mm/px · z∈[-826,-703]mm · 7 of 100 slices shown, 9 images]
[im 13/100  brain]
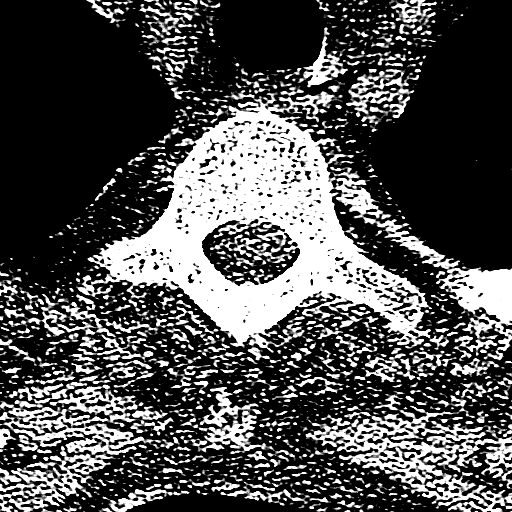
[im 13/100  bone]
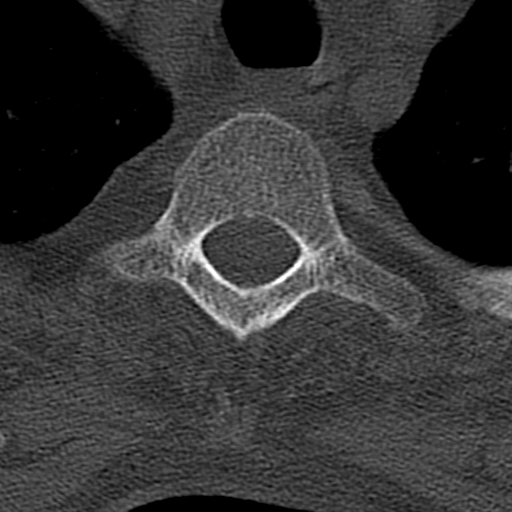
[im 25/100  brain]
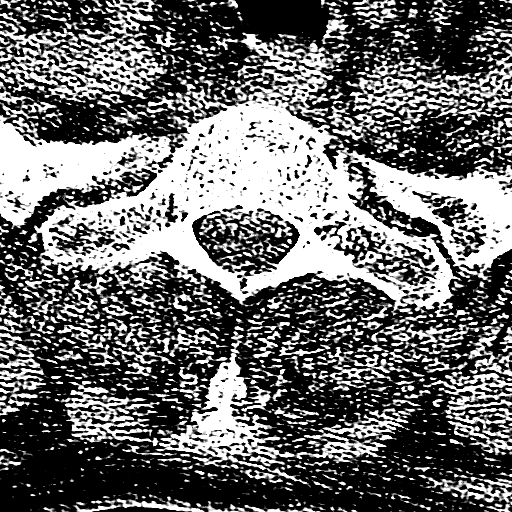
[im 38/100  brain]
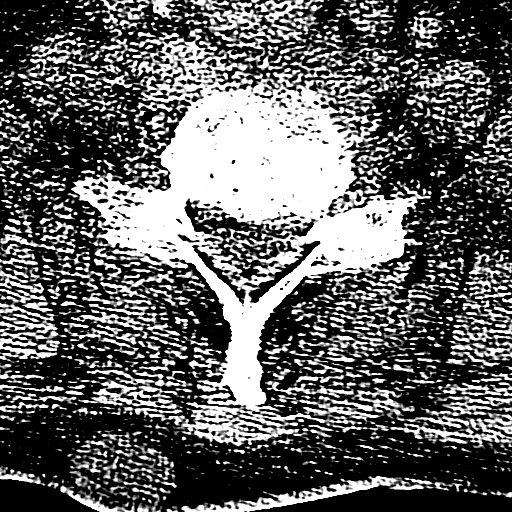
[im 50/100  brain]
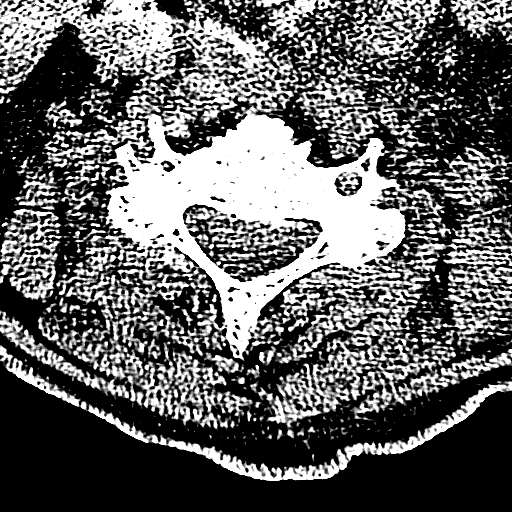
[im 62/100  brain]
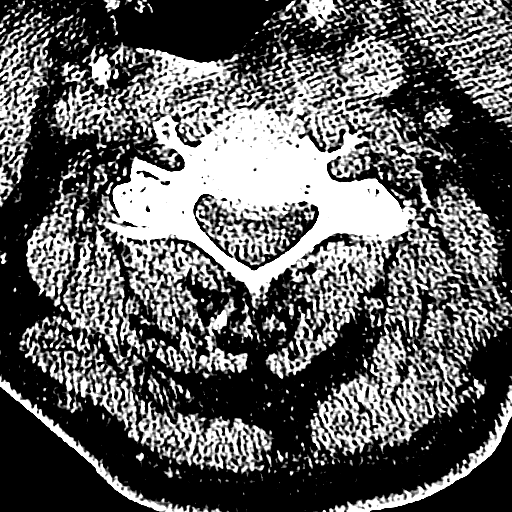
[im 62/100  bone]
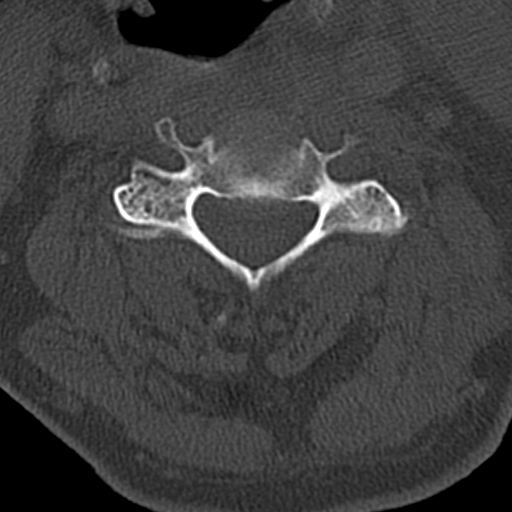
[im 75/100  brain]
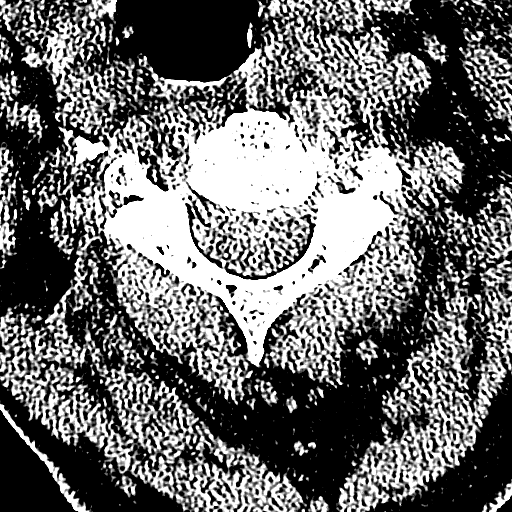
[im 87/100  brain]
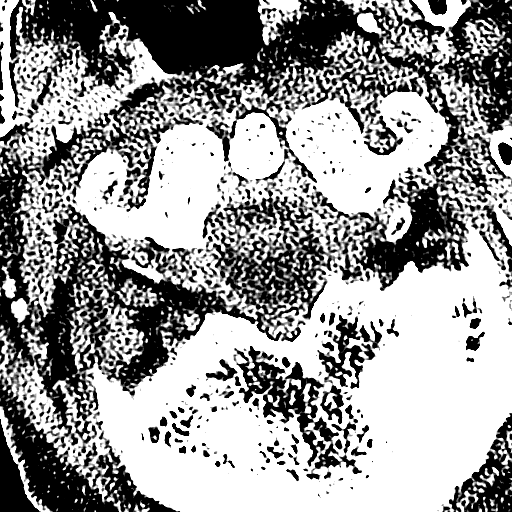

[16 of 47 positions shown; findings below may reference images not displayed]

FINDINGS: CT HEAD FINDINGS

Brain: Age-indeterminate lacunar infarcts in the left thalamus and
right cerebellum. No evidence of hemorrhage, hydrocephalus,
extra-axial collection or mass lesion/mass effect. Mild to moderate
generalized cerebral atrophy. Mild scattered periventricular and
subcortical white matter hypodensities are nonspecific, but favored
to reflect chronic microvascular ischemic changes.

Vascular: Atherosclerotic vascular calcification of the carotid
siphons. No hyperdense vessel.

Skull: Normal. Negative for fracture or focal lesion.

Other: Small left frontal scalp hematoma.

CT MAXILLOFACIAL FINDINGS

Osseous: No fracture or mandibular dislocation. No destructive
process. Periapical lucencies involving the right maxillary first
and second molars.

Orbits: Negative. No traumatic or inflammatory finding.

Sinuses: Chronic appearing mild-to-moderate moderate mucosal
thickening of the paranasal sinuses, moderate in the left maxillary
sinus. The mastoid air cells are clear.

Soft tissues: Left cheek laceration.

CT CERVICAL SPINE FINDINGS

Alignment: Reversal of the normal cervical lordosis, centered at C5,
unchanged. No traumatic malalignment.

Skull base and vertebrae: No acute cervical spine fracture. Mild
anterior superior endplate height loss at T1 with associated
Schmorl's node. No primary bone lesion or focal pathologic process.

Soft tissues and spinal canal: Mild prevertebral soft tissue
thickening. Subcutaneous stranding and fluid in the left neck. No
visible canal hematoma.

Disc levels: Moderate to severe disc height loss at C2-C3, C5-C6,
and C6-C7, progressed when compared to prior study. Mild bilateral
neuroforaminal stenosis at C5-C6 and C6-C7.

Upper chest: Negative.

Other: 2.9 cm superficial subcutaneous cystic lesion in the right
posterior neck, likely a sebaceous cyst.
IMPRESSION: 1. Age-indeterminate lacunar infarcts in the left thalamus and right
cerebellum.
2. No acute cervical spine fracture. Mild prevertebral soft tissue
thickening and subcutaneous stranding/fluid in the left neck.
Ligamentous and/or soft tissue injury is not excluded. Recommend MRI
cervical spine for further evaluation.
3. Mild anterior superior endplate height loss at T1 with associated
Schmorl's node is favored chronic.
4.  No acute facial fracture.
5. Small left frontal scalp hematoma.  Left cheek laceration.

## 2019-01-10 IMAGING — CR DG CHEST 1V PORT
1 series · 2 of 2 positions shown · non-contrast
Comparison: Chest radiograph dated 12/20/2017

CLINICAL DATA: 71-year-old male with fever.  Follow-up radiograph.

EXAM:
PORTABLE CHEST 1 VIEW

[Series 1: portable · 0.17mm/px · 2 of 2 slices shown]
[im 1/2]
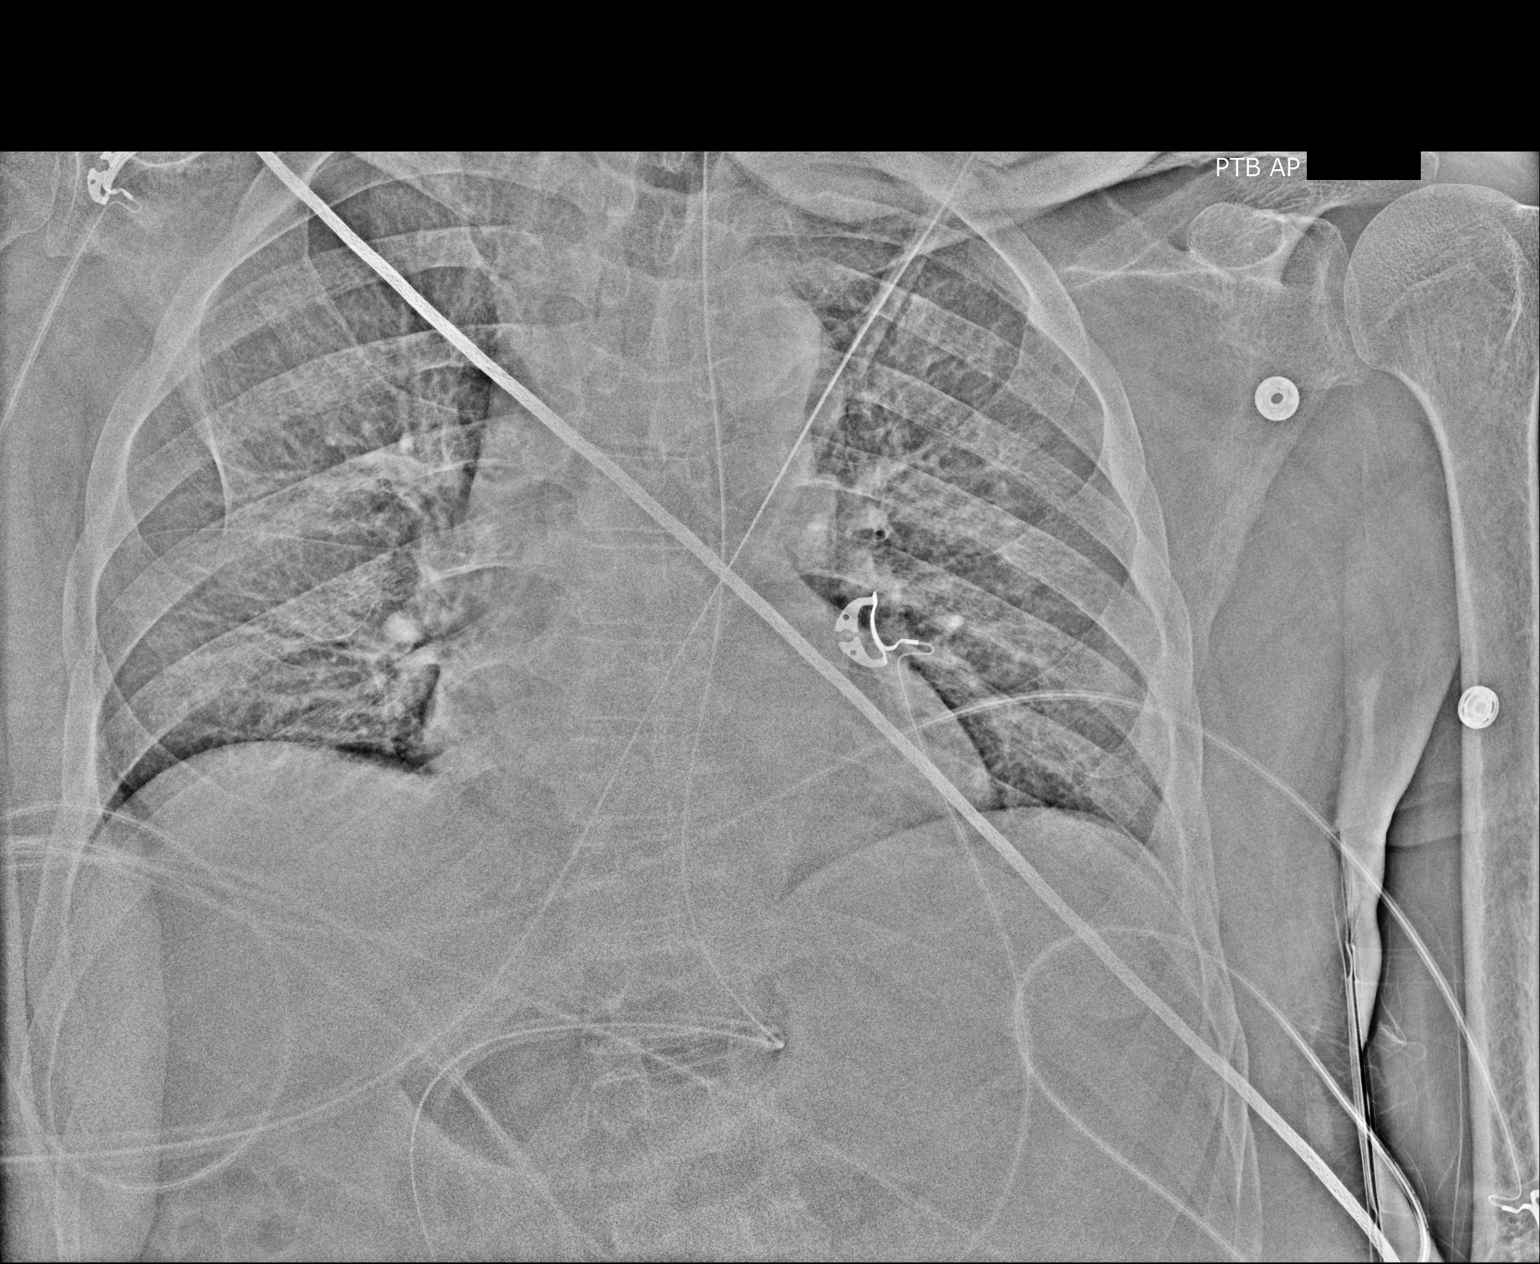
[im 2/2]
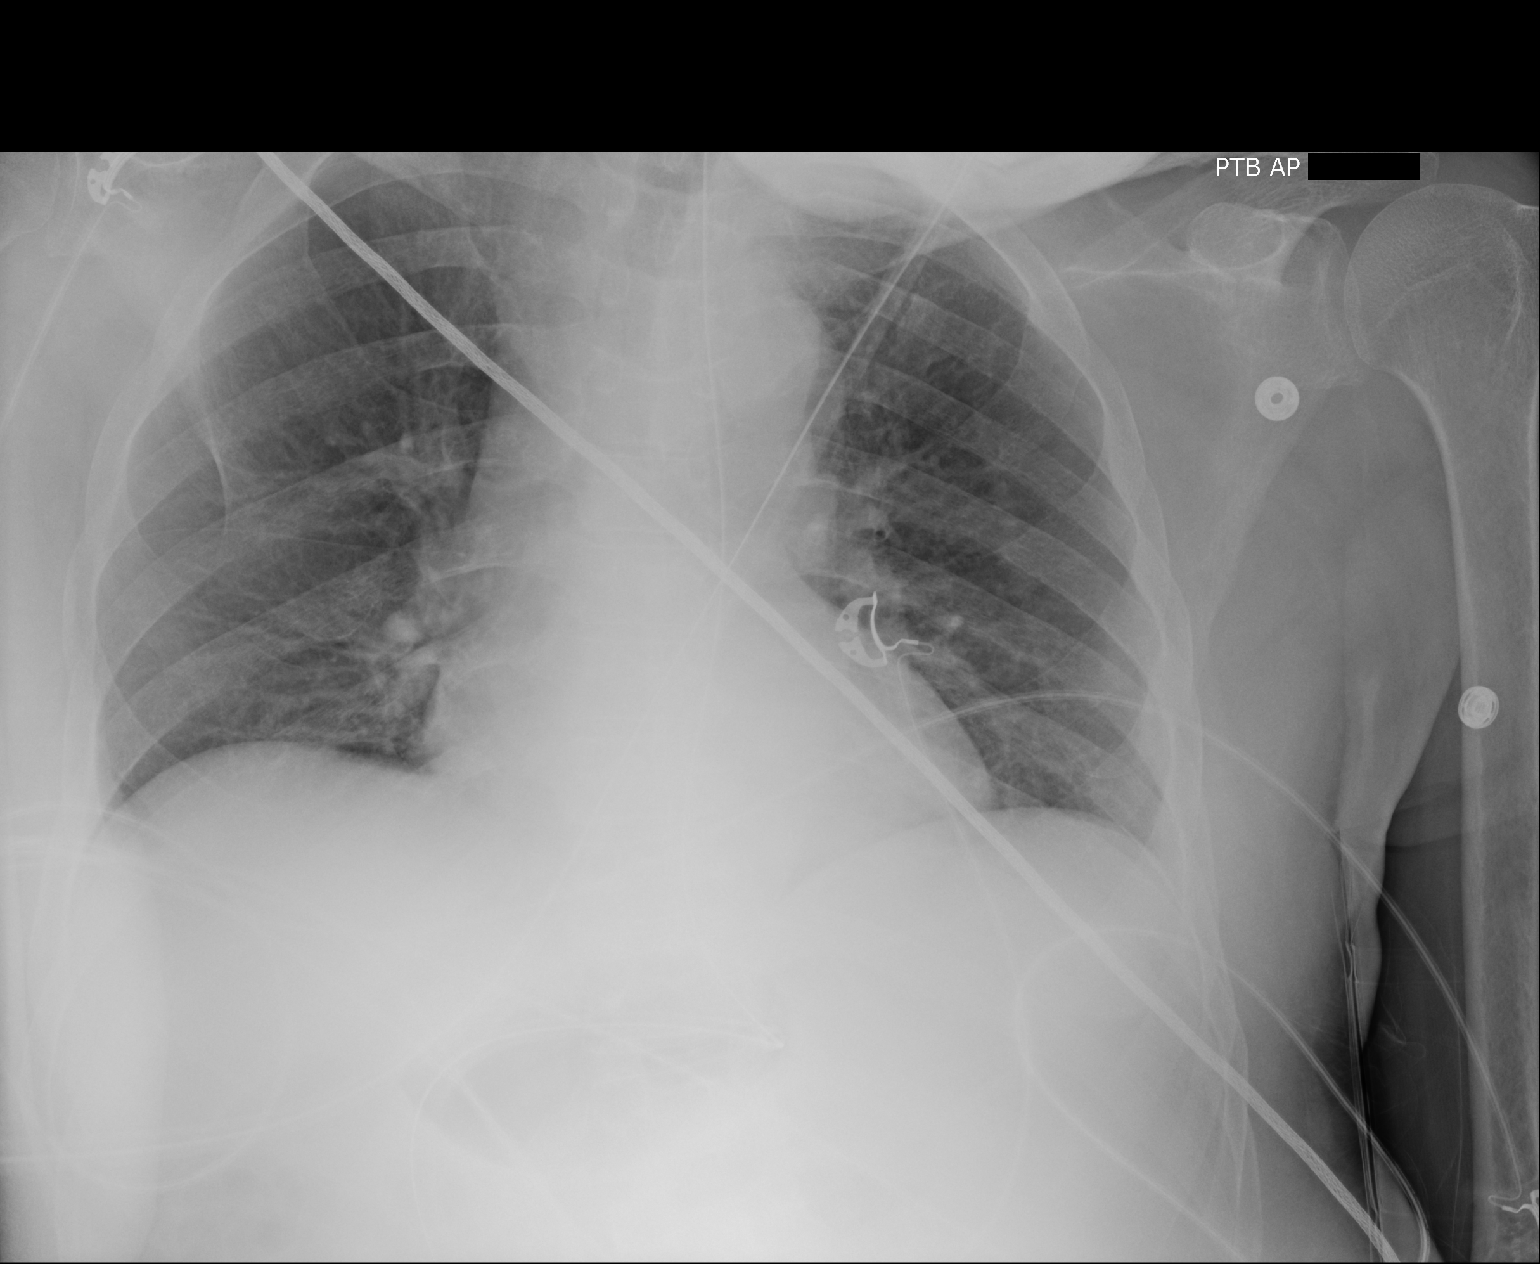

[2 of 2 positions shown; findings below may reference images not displayed]

FINDINGS: Enteric tube extending into the distal stomach with tip beyond the
inferior margin of the image. There is shallow inspiration. No focal
consolidation, pleural effusion, or pneumothorax. The cardiac
silhouette is within normal limits. No acute osseous pathology.
IMPRESSION: 1. No acute cardiopulmonary process.
2. Enteric tube in similar position with tip beyond the inferior
margin of the image.
